# Patient Record
Sex: Male | Born: 1955
Health system: Southern US, Community
[De-identification: ages and names within clinical notes are randomized; demographics above are authoritative.]

## PROBLEM LIST (undated history)

## (undated) DIAGNOSIS — J301 Allergic rhinitis due to pollen: Secondary | ICD-10-CM

## (undated) DIAGNOSIS — E039 Hypothyroidism, unspecified: Secondary | ICD-10-CM

## (undated) DIAGNOSIS — M1990 Unspecified osteoarthritis, unspecified site: Secondary | ICD-10-CM

## (undated) DIAGNOSIS — I25110 Atherosclerotic heart disease of native coronary artery with unstable angina pectoris: Secondary | ICD-10-CM

## (undated) DIAGNOSIS — J45909 Unspecified asthma, uncomplicated: Secondary | ICD-10-CM

## (undated) DIAGNOSIS — I25118 Atherosclerotic heart disease of native coronary artery with other forms of angina pectoris: Secondary | ICD-10-CM

## (undated) DIAGNOSIS — J329 Chronic sinusitis, unspecified: Secondary | ICD-10-CM

## (undated) DIAGNOSIS — I2510 Atherosclerotic heart disease of native coronary artery without angina pectoris: Secondary | ICD-10-CM

## (undated) DIAGNOSIS — M109 Gout, unspecified: Secondary | ICD-10-CM

## (undated) DIAGNOSIS — E785 Hyperlipidemia, unspecified: Secondary | ICD-10-CM

## (undated) DIAGNOSIS — R931 Abnormal findings on diagnostic imaging of heart and coronary circulation: Secondary | ICD-10-CM

## (undated) DIAGNOSIS — J189 Pneumonia, unspecified organism: Secondary | ICD-10-CM

## (undated) DIAGNOSIS — K029 Dental caries, unspecified: Secondary | ICD-10-CM

## (undated) DIAGNOSIS — E119 Type 2 diabetes mellitus without complications: Secondary | ICD-10-CM

## (undated) DIAGNOSIS — H269 Unspecified cataract: Secondary | ICD-10-CM

## (undated) DIAGNOSIS — K219 Gastro-esophageal reflux disease without esophagitis: Secondary | ICD-10-CM

## (undated) DIAGNOSIS — E669 Obesity, unspecified: Secondary | ICD-10-CM

## (undated) DIAGNOSIS — T7840XA Allergy, unspecified, initial encounter: Secondary | ICD-10-CM

## (undated) DIAGNOSIS — I513 Intracardiac thrombosis, not elsewhere classified: Secondary | ICD-10-CM

## (undated) DIAGNOSIS — F329 Major depressive disorder, single episode, unspecified: Secondary | ICD-10-CM

## (undated) HISTORY — DX: Gout, unspecified: M10.9

## (undated) HISTORY — DX: Unspecified cataract: H26.9

## (undated) HISTORY — DX: Allergic rhinitis due to pollen: J30.1

## (undated) HISTORY — PX: COLONOSCOPY W/ POLYPECTOMY: SHX1380

## (undated) HISTORY — PX: CATARACT EXTRACTION W/ INTRAOCULAR LENS  IMPLANT, BILATERAL: SHX1307

## (undated) HISTORY — DX: Gastro-esophageal reflux disease without esophagitis: K21.9

## (undated) HISTORY — PX: DENTAL SURGERY: SHX609

## (undated) HISTORY — PX: NOSE SURGERY: SHX723

## (undated) HISTORY — DX: Hypothyroidism, unspecified: E03.9

## (undated) HISTORY — DX: Unspecified osteoarthritis, unspecified site: M19.90

## (undated) HISTORY — PX: KNEE SURGERY: SHX244

## (undated) HISTORY — DX: Dental caries, unspecified: K02.9

## (undated) HISTORY — PX: OTHER SURGICAL HISTORY: SHX169

## (undated) HISTORY — DX: Major depressive disorder, single episode, unspecified: F32.9

## (undated) HISTORY — DX: Hyperlipidemia, unspecified: E78.5

## (undated) HISTORY — DX: Allergy, unspecified, initial encounter: T78.40XA

## (undated) HISTORY — DX: Unspecified asthma, uncomplicated: J45.909

## (undated) HISTORY — DX: Abnormal findings on diagnostic imaging of heart and coronary circulation: R93.1

## (undated) HISTORY — DX: Chronic sinusitis, unspecified: J32.9

## (undated) HISTORY — DX: Obesity, unspecified: E66.9

## (undated) HISTORY — DX: Atherosclerotic heart disease of native coronary artery with other forms of angina pectoris: I25.118

## (undated) HISTORY — DX: Type 2 diabetes mellitus without complications: E11.9

## (undated) HISTORY — DX: Atherosclerotic heart disease of native coronary artery with unstable angina pectoris: I25.110

---

## 1997-11-29 DIAGNOSIS — R054 Cough syncope: Secondary | ICD-10-CM

## 1997-11-29 DIAGNOSIS — R55 Syncope and collapse: Secondary | ICD-10-CM | POA: Insufficient documentation

## 1997-11-29 HISTORY — DX: Cough syncope: R05.4

## 1997-11-29 HISTORY — DX: Syncope and collapse: R55

## 2007-04-17 HISTORY — PX: ESOPHAGOGASTRODUODENOSCOPY: SHX1529

## 2012-11-27 HISTORY — PX: COLONOSCOPY W/ POLYPECTOMY: SHX1380

## 2016-03-01 DIAGNOSIS — K219 Gastro-esophageal reflux disease without esophagitis: Secondary | ICD-10-CM | POA: Diagnosis not present

## 2016-03-01 DIAGNOSIS — J301 Allergic rhinitis due to pollen: Secondary | ICD-10-CM | POA: Diagnosis not present

## 2016-03-01 DIAGNOSIS — J45991 Cough variant asthma: Secondary | ICD-10-CM | POA: Diagnosis not present

## 2016-03-05 DIAGNOSIS — J45901 Unspecified asthma with (acute) exacerbation: Secondary | ICD-10-CM | POA: Diagnosis not present

## 2016-03-05 DIAGNOSIS — J209 Acute bronchitis, unspecified: Secondary | ICD-10-CM | POA: Diagnosis not present

## 2016-03-18 DIAGNOSIS — J301 Allergic rhinitis due to pollen: Secondary | ICD-10-CM | POA: Diagnosis not present

## 2016-03-24 DIAGNOSIS — J301 Allergic rhinitis due to pollen: Secondary | ICD-10-CM | POA: Diagnosis not present

## 2016-03-30 DIAGNOSIS — J301 Allergic rhinitis due to pollen: Secondary | ICD-10-CM | POA: Diagnosis not present

## 2016-04-06 DIAGNOSIS — J301 Allergic rhinitis due to pollen: Secondary | ICD-10-CM | POA: Diagnosis not present

## 2016-04-13 DIAGNOSIS — J301 Allergic rhinitis due to pollen: Secondary | ICD-10-CM | POA: Diagnosis not present

## 2016-04-23 DIAGNOSIS — J301 Allergic rhinitis due to pollen: Secondary | ICD-10-CM | POA: Diagnosis not present

## 2016-04-27 DIAGNOSIS — J301 Allergic rhinitis due to pollen: Secondary | ICD-10-CM | POA: Diagnosis not present

## 2016-05-06 DIAGNOSIS — J301 Allergic rhinitis due to pollen: Secondary | ICD-10-CM | POA: Diagnosis not present

## 2016-05-11 DIAGNOSIS — E1129 Type 2 diabetes mellitus with other diabetic kidney complication: Secondary | ICD-10-CM | POA: Diagnosis not present

## 2016-05-11 DIAGNOSIS — J301 Allergic rhinitis due to pollen: Secondary | ICD-10-CM | POA: Diagnosis not present

## 2016-05-11 DIAGNOSIS — R809 Proteinuria, unspecified: Secondary | ICD-10-CM | POA: Diagnosis not present

## 2016-05-18 DIAGNOSIS — J301 Allergic rhinitis due to pollen: Secondary | ICD-10-CM | POA: Diagnosis not present

## 2016-05-25 DIAGNOSIS — J301 Allergic rhinitis due to pollen: Secondary | ICD-10-CM | POA: Diagnosis not present

## 2016-05-26 DIAGNOSIS — M7061 Trochanteric bursitis, right hip: Secondary | ICD-10-CM | POA: Diagnosis not present

## 2016-05-26 DIAGNOSIS — M25551 Pain in right hip: Secondary | ICD-10-CM | POA: Diagnosis not present

## 2016-06-02 DIAGNOSIS — J301 Allergic rhinitis due to pollen: Secondary | ICD-10-CM | POA: Diagnosis not present

## 2016-06-08 DIAGNOSIS — J301 Allergic rhinitis due to pollen: Secondary | ICD-10-CM | POA: Diagnosis not present

## 2016-06-15 DIAGNOSIS — J301 Allergic rhinitis due to pollen: Secondary | ICD-10-CM | POA: Diagnosis not present

## 2016-06-21 DIAGNOSIS — J301 Allergic rhinitis due to pollen: Secondary | ICD-10-CM | POA: Diagnosis not present

## 2016-06-29 DIAGNOSIS — J301 Allergic rhinitis due to pollen: Secondary | ICD-10-CM | POA: Diagnosis not present

## 2016-07-06 DIAGNOSIS — J301 Allergic rhinitis due to pollen: Secondary | ICD-10-CM | POA: Diagnosis not present

## 2016-07-13 DIAGNOSIS — J301 Allergic rhinitis due to pollen: Secondary | ICD-10-CM | POA: Diagnosis not present

## 2016-07-22 DIAGNOSIS — J301 Allergic rhinitis due to pollen: Secondary | ICD-10-CM | POA: Diagnosis not present

## 2016-07-28 DIAGNOSIS — J301 Allergic rhinitis due to pollen: Secondary | ICD-10-CM | POA: Diagnosis not present

## 2016-08-10 DIAGNOSIS — J301 Allergic rhinitis due to pollen: Secondary | ICD-10-CM | POA: Diagnosis not present

## 2016-08-18 DIAGNOSIS — E1129 Type 2 diabetes mellitus with other diabetic kidney complication: Secondary | ICD-10-CM | POA: Diagnosis not present

## 2016-08-18 DIAGNOSIS — E119 Type 2 diabetes mellitus without complications: Secondary | ICD-10-CM | POA: Diagnosis not present

## 2016-08-18 DIAGNOSIS — Z87891 Personal history of nicotine dependence: Secondary | ICD-10-CM | POA: Diagnosis not present

## 2016-08-18 DIAGNOSIS — R070 Pain in throat: Secondary | ICD-10-CM | POA: Diagnosis not present

## 2016-08-18 DIAGNOSIS — R809 Proteinuria, unspecified: Secondary | ICD-10-CM | POA: Diagnosis not present

## 2016-08-18 DIAGNOSIS — R1310 Dysphagia, unspecified: Secondary | ICD-10-CM | POA: Diagnosis not present

## 2016-08-24 DIAGNOSIS — J301 Allergic rhinitis due to pollen: Secondary | ICD-10-CM | POA: Diagnosis not present

## 2016-09-07 DIAGNOSIS — J301 Allergic rhinitis due to pollen: Secondary | ICD-10-CM | POA: Diagnosis not present

## 2016-09-21 DIAGNOSIS — J301 Allergic rhinitis due to pollen: Secondary | ICD-10-CM | POA: Diagnosis not present

## 2016-10-05 DIAGNOSIS — J301 Allergic rhinitis due to pollen: Secondary | ICD-10-CM | POA: Diagnosis not present

## 2016-10-20 DIAGNOSIS — J301 Allergic rhinitis due to pollen: Secondary | ICD-10-CM | POA: Diagnosis not present

## 2016-11-09 DIAGNOSIS — J301 Allergic rhinitis due to pollen: Secondary | ICD-10-CM | POA: Diagnosis not present

## 2016-11-12 DIAGNOSIS — M109 Gout, unspecified: Secondary | ICD-10-CM | POA: Diagnosis not present

## 2016-11-24 DIAGNOSIS — J301 Allergic rhinitis due to pollen: Secondary | ICD-10-CM | POA: Diagnosis not present

## 2016-12-07 DIAGNOSIS — J301 Allergic rhinitis due to pollen: Secondary | ICD-10-CM | POA: Diagnosis not present

## 2016-12-22 DIAGNOSIS — J309 Allergic rhinitis, unspecified: Secondary | ICD-10-CM | POA: Diagnosis not present

## 2017-01-04 DIAGNOSIS — J301 Allergic rhinitis due to pollen: Secondary | ICD-10-CM | POA: Diagnosis not present

## 2017-01-13 DIAGNOSIS — M170 Bilateral primary osteoarthritis of knee: Secondary | ICD-10-CM | POA: Diagnosis not present

## 2017-01-18 DIAGNOSIS — J301 Allergic rhinitis due to pollen: Secondary | ICD-10-CM | POA: Diagnosis not present

## 2017-01-25 DIAGNOSIS — E039 Hypothyroidism, unspecified: Secondary | ICD-10-CM | POA: Diagnosis not present

## 2017-01-25 DIAGNOSIS — Z1389 Encounter for screening for other disorder: Secondary | ICD-10-CM | POA: Diagnosis not present

## 2017-01-25 DIAGNOSIS — E7800 Pure hypercholesterolemia, unspecified: Secondary | ICD-10-CM | POA: Diagnosis not present

## 2017-01-25 DIAGNOSIS — Z0000 Encounter for general adult medical examination without abnormal findings: Secondary | ICD-10-CM | POA: Diagnosis not present

## 2017-01-25 DIAGNOSIS — E119 Type 2 diabetes mellitus without complications: Secondary | ICD-10-CM | POA: Diagnosis not present

## 2017-03-09 DIAGNOSIS — J301 Allergic rhinitis due to pollen: Secondary | ICD-10-CM | POA: Diagnosis not present

## 2017-03-31 DIAGNOSIS — J301 Allergic rhinitis due to pollen: Secondary | ICD-10-CM | POA: Diagnosis not present

## 2017-04-27 DIAGNOSIS — E119 Type 2 diabetes mellitus without complications: Secondary | ICD-10-CM | POA: Diagnosis not present

## 2017-04-27 DIAGNOSIS — R002 Palpitations: Secondary | ICD-10-CM | POA: Diagnosis not present

## 2017-04-27 DIAGNOSIS — J301 Allergic rhinitis due to pollen: Secondary | ICD-10-CM | POA: Diagnosis not present

## 2017-04-27 DIAGNOSIS — E039 Hypothyroidism, unspecified: Secondary | ICD-10-CM | POA: Diagnosis not present

## 2017-05-04 DIAGNOSIS — J301 Allergic rhinitis due to pollen: Secondary | ICD-10-CM | POA: Diagnosis not present

## 2017-08-04 DIAGNOSIS — E7800 Pure hypercholesterolemia, unspecified: Secondary | ICD-10-CM | POA: Diagnosis not present

## 2017-08-04 DIAGNOSIS — E119 Type 2 diabetes mellitus without complications: Secondary | ICD-10-CM | POA: Diagnosis not present

## 2017-11-16 DIAGNOSIS — Z720 Tobacco use: Secondary | ICD-10-CM | POA: Diagnosis not present

## 2017-11-16 DIAGNOSIS — E7800 Pure hypercholesterolemia, unspecified: Secondary | ICD-10-CM | POA: Diagnosis not present

## 2017-11-16 DIAGNOSIS — F331 Major depressive disorder, recurrent, moderate: Secondary | ICD-10-CM | POA: Diagnosis not present

## 2017-11-16 DIAGNOSIS — E119 Type 2 diabetes mellitus without complications: Secondary | ICD-10-CM | POA: Diagnosis not present

## 2017-12-12 DIAGNOSIS — R1310 Dysphagia, unspecified: Secondary | ICD-10-CM | POA: Diagnosis not present

## 2017-12-12 DIAGNOSIS — R070 Pain in throat: Secondary | ICD-10-CM | POA: Diagnosis not present

## 2017-12-12 DIAGNOSIS — Z87891 Personal history of nicotine dependence: Secondary | ICD-10-CM | POA: Diagnosis not present

## 2017-12-12 DIAGNOSIS — Z720 Tobacco use: Secondary | ICD-10-CM | POA: Diagnosis not present

## 2017-12-28 DIAGNOSIS — R1310 Dysphagia, unspecified: Secondary | ICD-10-CM | POA: Diagnosis not present

## 2017-12-28 DIAGNOSIS — I700 Atherosclerosis of aorta: Secondary | ICD-10-CM | POA: Diagnosis not present

## 2018-01-02 DIAGNOSIS — M7042 Prepatellar bursitis, left knee: Secondary | ICD-10-CM | POA: Diagnosis not present

## 2018-01-06 DIAGNOSIS — R070 Pain in throat: Secondary | ICD-10-CM | POA: Diagnosis not present

## 2018-01-06 DIAGNOSIS — R1310 Dysphagia, unspecified: Secondary | ICD-10-CM | POA: Diagnosis not present

## 2018-01-06 DIAGNOSIS — Z87891 Personal history of nicotine dependence: Secondary | ICD-10-CM | POA: Diagnosis not present

## 2018-02-14 ENCOUNTER — Encounter: Payer: Self-pay | Admitting: Gastroenterology

## 2018-02-22 DIAGNOSIS — K219 Gastro-esophageal reflux disease without esophagitis: Secondary | ICD-10-CM | POA: Diagnosis not present

## 2018-02-22 DIAGNOSIS — Z1389 Encounter for screening for other disorder: Secondary | ICD-10-CM | POA: Diagnosis not present

## 2018-02-22 DIAGNOSIS — R809 Proteinuria, unspecified: Secondary | ICD-10-CM | POA: Diagnosis not present

## 2018-02-22 DIAGNOSIS — E039 Hypothyroidism, unspecified: Secondary | ICD-10-CM | POA: Diagnosis not present

## 2018-02-22 DIAGNOSIS — Z720 Tobacco use: Secondary | ICD-10-CM | POA: Diagnosis not present

## 2018-02-22 DIAGNOSIS — Z6836 Body mass index (BMI) 36.0-36.9, adult: Secondary | ICD-10-CM | POA: Diagnosis not present

## 2018-02-22 DIAGNOSIS — E7800 Pure hypercholesterolemia, unspecified: Secondary | ICD-10-CM | POA: Diagnosis not present

## 2018-02-22 DIAGNOSIS — Z0000 Encounter for general adult medical examination without abnormal findings: Secondary | ICD-10-CM | POA: Diagnosis not present

## 2018-02-22 DIAGNOSIS — Z87891 Personal history of nicotine dependence: Secondary | ICD-10-CM | POA: Diagnosis not present

## 2018-02-27 ENCOUNTER — Encounter: Payer: Self-pay | Admitting: Gastroenterology

## 2018-03-07 DIAGNOSIS — E782 Mixed hyperlipidemia: Secondary | ICD-10-CM | POA: Insufficient documentation

## 2018-03-07 DIAGNOSIS — E785 Hyperlipidemia, unspecified: Secondary | ICD-10-CM | POA: Insufficient documentation

## 2018-03-07 DIAGNOSIS — E1169 Type 2 diabetes mellitus with other specified complication: Secondary | ICD-10-CM | POA: Insufficient documentation

## 2018-03-07 DIAGNOSIS — E039 Hypothyroidism, unspecified: Secondary | ICD-10-CM | POA: Insufficient documentation

## 2018-03-07 DIAGNOSIS — F32A Depression, unspecified: Secondary | ICD-10-CM | POA: Insufficient documentation

## 2018-03-07 DIAGNOSIS — E119 Type 2 diabetes mellitus without complications: Secondary | ICD-10-CM | POA: Insufficient documentation

## 2018-03-07 DIAGNOSIS — M1A9XX0 Chronic gout, unspecified, without tophus (tophi): Secondary | ICD-10-CM

## 2018-03-07 DIAGNOSIS — M109 Gout, unspecified: Secondary | ICD-10-CM

## 2018-03-07 DIAGNOSIS — K219 Gastro-esophageal reflux disease without esophagitis: Secondary | ICD-10-CM | POA: Insufficient documentation

## 2018-03-07 DIAGNOSIS — F329 Major depressive disorder, single episode, unspecified: Secondary | ICD-10-CM | POA: Insufficient documentation

## 2018-03-07 HISTORY — DX: Gout, unspecified: M10.9

## 2018-03-07 HISTORY — DX: Depression, unspecified: F32.A

## 2018-03-07 HISTORY — DX: Gastro-esophageal reflux disease without esophagitis: K21.9

## 2018-03-07 HISTORY — DX: Type 2 diabetes mellitus without complications: E11.9

## 2018-03-07 HISTORY — DX: Hypothyroidism, unspecified: E03.9

## 2018-03-07 HISTORY — DX: Mixed hyperlipidemia: E78.2

## 2018-03-07 HISTORY — DX: Hyperlipidemia, unspecified: E78.5

## 2018-03-07 HISTORY — DX: Chronic gout, unspecified, without tophus (tophi): M1A.9XX0

## 2018-03-08 DIAGNOSIS — R002 Palpitations: Secondary | ICD-10-CM

## 2018-03-08 HISTORY — DX: Palpitations: R00.2

## 2018-03-08 NOTE — Progress Notes (Signed)
Cardiology Office Note:    Date:  03/09/2018   ID:  Derek Moore, DOB [birth year], MRN [id number]  PCP:  Crist Fat, MD  Cardiologist:  Norman Herrlich, MD   Referring MD: Crist Fat, MD  ASSESSMENT:    1. Palpitation   2. Abnormal EKG   3. Type 2 diabetes mellitus without complication, without long-term current use of insulin (HCC)   4. Pure hypercholesterolemia    PLAN:    In order of problems listed above:  1. With frequent symptoms he will undergo 48-hour Holter monitor I suspect he is having symptomatic PVCs and if that is case would benefit from beta-blocker 2. At high cardiovascular risk abnormal EKG he will undergo stress echo if high risk markers would benefit from revascularization 3. Stable continue current treatment 4. Stable continue statin 5. Hypertension stable continue current treatment  Next appointment   Medication Adjustments/Labs and Tests Ordered: Current medicines are reviewed at length with the patient today.  Concerns regarding medicines are outlined above.  Orders Placed This Encounter  Procedures  . HOLTER MONITOR - 48 HOUR  . EKG 12-Lead  . ECHOCARDIOGRAM STRESS TEST   No orders of the defined types were placed in this encounter.    Chief Complaint  Patient presents with  . New Patient (Initial Visit)    per Dr Leonia Reader to evaluate "heart fluttering"       History of Present Illness:    Derek Moore is a 62 y.o. male who is being seen today for the evaluation of palpitation at the request of Crist Fat, MD. About 20 years ago he saw Dr. Sherlyn Lick for evaluation of palpitation from his description had extra beats and he wore a monitor and had a stress test that was reassuring.  Since then he has had intermittent symptoms but recently he is under increased stress caring for multiple elderly family members and is much more aware of his heart skipping causing and being forceful occurring repetitively occurring just about every day  especially in the evening hours and disrupting his life.  He has had no syncope no exertional chest pain.  He takes no over-the-counter proarrhythmic medications.  He has mild exertional shortness of breath with vigorous heavy gardening work but not severe or limiting.  He is at high cardiovascular risk with type 2 diabetes hypertension hyperlipidemia and age   Past Medical History:  Diagnosis Date  . Acquired hypothyroidism 03/07/2018  . Depression 03/07/2018  . GERD (gastroesophageal reflux disease) 03/07/2018  . Gout 03/07/2018  . Hyperlipidemia 03/07/2018  . Type 2 diabetes mellitus (HCC) 03/07/2018    Past Surgical History:  Procedure Laterality Date  . kidney stone extraction    . KNEE SURGERY    . NOSE SURGERY      Current Medications: Current Meds  Medication Sig  . aspirin EC 81 MG tablet Take 81 mg by mouth daily.  Marland Kitchen atorvastatin (LIPITOR) 10 MG tablet Take 10 mg by mouth every other day.  . empagliflozin (JARDIANCE) 10 MG TABS tablet Take 10 mg by mouth daily.  Marland Kitchen levothyroxine (SYNTHROID, LEVOTHROID) 150 MCG tablet Take 150 mcg by mouth daily before breakfast.  . lisinopril (PRINIVIL,ZESTRIL) 2.5 MG tablet Take 2.5 mg by mouth daily.  . metFORMIN (GLUCOPHAGE) 1000 MG tablet Take 1,000 mg by mouth 2 (two) times daily with a meal.  . Omega-3 Fatty Acids (FISH OIL PO) Take 1 tablet by mouth 2 (two) times daily.  . pantoprazole (PROTONIX) 20  MG tablet Take 20 mg by mouth daily.  Marland Kitchen PARoxetine (PAXIL) 40 MG tablet Take 40 mg by mouth daily.     Allergies:   Codeine and Hydrocodone-acetaminophen   Social History   Socioeconomic History  . Marital status: Married    Spouse name: Not on file  . Number of children: Not on file  . Years of education: Not on file  . Highest education level: Not on file  Occupational History  . Not on file  Social Needs  . Financial resource strain: Not on file  . Food insecurity:    Worry: Not on file    Inability: Not on file  . Transportation  needs:    Medical: Not on file    Non-medical: Not on file  Tobacco Use  . Smoking status: Former Smoker    Types: Cigars, Pipe  . Smokeless tobacco: Current User    Types: Snuff  . Tobacco comment: stopped smoking pipe tobacco 30 years ago  Substance and Sexual Activity  . Alcohol use: Yes  . Drug use: Never  . Sexual activity: Not on file  Lifestyle  . Physical activity:    Days per week: Not on file    Minutes per session: Not on file  . Stress: Not on file  Relationships  . Social connections:    Talks on phone: Not on file    Gets together: Not on file    Attends religious service: Not on file    Active member of club or organization: Not on file    Attends meetings of clubs or organizations: Not on file    Relationship status: Not on file  Other Topics Concern  . Not on file  Social History Narrative  . Not on file     Family History: The patient's family history includes Blindness in his father; CAD in his father; Cancer in his father; Heart Problems in his mother; Hypertension in his father; Rheum arthritis in his mother.  ROS:   Review of Systems  Constitution: Negative.  HENT: Negative.   Eyes: Positive for visual disturbance.  Cardiovascular: Positive for palpitations.  Respiratory: Positive for shortness of breath.   Endocrine: Negative.   Hematologic/Lymphatic: Negative.   Skin: Negative.   Musculoskeletal: Negative.   Gastrointestinal: Negative.   Genitourinary: Negative.   Neurological: Negative.   Psychiatric/Behavioral: Negative.   Allergic/Immunologic: Negative.    Please see the history of present illness.     All other systems reviewed and are negative.  EKGs/Labs/Other Studies Reviewed:    The following studies were reviewed today:   EKG:  EKG is  ordered today.  The ekg ordered today demonstrates SRTh t inversion lateral, consider ASMI  Recent Labs:  CBC CMP TSH normal No results found for requested labs within last 8760 hours.    Recent Lipid Panel No results found for: CHOL, TRIG, HDL, CHOLHDL, VLDL, LDLCALC, LDLDIRECT  Physical Exam:    VS:  BP 100/66 (BP Location: Left Arm, Patient Position: Sitting, Cuff Size: Large)   Pulse 98   Ht 5\' 6"  (1.676 m)   Wt 224 lb 1.9 oz (101.7 kg)   SpO2 (!) 59%   BMI 36.17 kg/m     Wt Readings from Last 3 Encounters:  03/09/18 224 lb 1.9 oz (101.7 kg)     GEN:  Well nourished, well developed in no acute distress HEENT: Normal NECK: No JVD; No carotid bruits LYMPHATICS: No lymphadenopathy CARDIAC: RRR, no murmurs, rubs, gallops RESPIRATORY:  Clear  to auscultation without rales, wheezing or rhonchi  ABDOMEN: Soft, non-tender, non-distended MUSCULOSKELETAL:  No edema; No deformity  SKIN: Warm and dry NEUROLOGIC:  Alert and oriented x 3 PSYCHIATRIC:  Normal affect     Signed, Norman Herrlich, MD  03/09/2018 1:05 PM    [facility]

## 2018-03-09 ENCOUNTER — Encounter: Payer: Self-pay | Admitting: Cardiology

## 2018-03-09 ENCOUNTER — Ambulatory Visit (INDEPENDENT_AMBULATORY_CARE_PROVIDER_SITE_OTHER): Payer: BLUE CROSS/BLUE SHIELD | Admitting: Cardiology

## 2018-03-09 VITALS — BP 100/66 | HR 98 | Ht 66.0 in | Wt 224.1 lb

## 2018-03-09 DIAGNOSIS — R9431 Abnormal electrocardiogram [ECG] [EKG]: Secondary | ICD-10-CM | POA: Diagnosis not present

## 2018-03-09 DIAGNOSIS — E119 Type 2 diabetes mellitus without complications: Secondary | ICD-10-CM

## 2018-03-09 DIAGNOSIS — E7800 Pure hypercholesterolemia, unspecified: Secondary | ICD-10-CM | POA: Diagnosis not present

## 2018-03-09 DIAGNOSIS — R002 Palpitations: Secondary | ICD-10-CM

## 2018-03-09 HISTORY — DX: Abnormal electrocardiogram (ECG) (EKG): R94.31

## 2018-03-09 NOTE — Patient Instructions (Addendum)
Medication Instructions:  Your physician recommends that you continue on your current medications as directed. Please refer to the Current Medication list given to you today.  Labwork: None  Testing/Procedures: You had an EKG today.  Your physician has requested that you have a stress echocardiogram. For further information please visit [email or web address] Please follow instruction sheet as given.  Your physician has recommended that you wear a holter monitor. Holter monitors are medical devices that record the heart's electrical activity. Doctors most often use these monitors to diagnose arrhythmias. Arrhythmias are problems with the speed or rhythm of the heartbeat. The monitor is a small, portable device. You can wear one while you do your normal daily activities. This is usually used to diagnose what is causing palpitations/syncope (passing out). 48 hours.  Follow-Up: Your physician recommends that you schedule a follow-up appointment in: 4 weeks.  Any Other Special Instructions Will Be Listed Below (If Applicable).     If you need a refill on your cardiac medications before your next appointment, please call your pharmacy.    1. Avoid all over-the-counter antihistamines except Claritin/Loratadine and Zyrtec/Cetrizine. 2. Avoid all combination including cold sinus allergies flu decongestant and sleep medications 3. You can use Robitussin DM Mucinex and Mucinex DM for cough. 4. can use Tylenol aspirin ibuprofen and naproxen but no combinations such as sleep or sinus.

## 2018-04-03 ENCOUNTER — Telehealth: Payer: Self-pay | Admitting: *Deleted

## 2018-04-03 ENCOUNTER — Ambulatory Visit (HOSPITAL_BASED_OUTPATIENT_CLINIC_OR_DEPARTMENT_OTHER): Payer: BLUE CROSS/BLUE SHIELD

## 2018-04-03 ENCOUNTER — Encounter: Payer: Self-pay | Admitting: Gastroenterology

## 2018-04-03 NOTE — Telephone Encounter (Signed)
Pt has an echo and Holter monitor scheduled for 05-01-2018- he saw cardio 03-09-2018 with an abnormal EKG, and these tests ordered - I called pt and LM he will need to cancel his PV 5-8 and his colon 5-30 until AFTER all cardiac issues have been addressed - cardio note states may need revascularization- so he has to have cardiac clearance to have colon first.  [facility] so questions can be answered   Hilda Lias PV

## 2018-04-03 NOTE — Telephone Encounter (Signed)
Spoke with pt back in April and he was informed at this time he has to have cardiac clearance before he can do his colon here-  It appears the have been RS but he has not completed any cardio testing thus far   Hilda Lias pV

## 2018-04-04 NOTE — Telephone Encounter (Signed)
Pt has called and cancelled PV and colon- he needs to RS once he has complete cardiac clearance   Hilda Lias PV

## 2018-04-05 ENCOUNTER — Telehealth: Payer: Self-pay

## 2018-04-05 NOTE — Telephone Encounter (Signed)
Left message to return call. Advised patient that due to testing being rescheduled to 05/01/18 he should also reschedule his appointment with Dr. Dulce Sellar from 04/17/18 to around 05/10/18 so that results of testing have been reviewed. Advised patient if he had any questions he could ask to speak to the nurse, otherwise, front desk staff would be able to reschedule office visit with Dr. Dulce Sellar.

## 2018-04-17 ENCOUNTER — Ambulatory Visit: Payer: BLUE CROSS/BLUE SHIELD | Admitting: Cardiology

## 2018-04-18 DIAGNOSIS — R05 Cough: Secondary | ICD-10-CM | POA: Diagnosis not present

## 2018-04-19 DIAGNOSIS — M7042 Prepatellar bursitis, left knee: Secondary | ICD-10-CM | POA: Diagnosis not present

## 2018-04-27 ENCOUNTER — Encounter: Payer: BLUE CROSS/BLUE SHIELD | Admitting: Gastroenterology

## 2018-05-01 ENCOUNTER — Ambulatory Visit (HOSPITAL_BASED_OUTPATIENT_CLINIC_OR_DEPARTMENT_OTHER)
Admission: RE | Admit: 2018-05-01 | Discharge: 2018-05-01 | Disposition: A | Discharge status: Home / Self Care | Payer: BLUE CROSS/BLUE SHIELD | Source: Ambulatory Visit | Attending: Cardiology | Admitting: Cardiology

## 2018-05-01 ENCOUNTER — Ambulatory Visit: Payer: BLUE CROSS/BLUE SHIELD

## 2018-05-01 DIAGNOSIS — E785 Hyperlipidemia, unspecified: Secondary | ICD-10-CM | POA: Insufficient documentation

## 2018-05-01 DIAGNOSIS — E119 Type 2 diabetes mellitus without complications: Secondary | ICD-10-CM | POA: Insufficient documentation

## 2018-05-01 DIAGNOSIS — R002 Palpitations: Secondary | ICD-10-CM | POA: Diagnosis not present

## 2018-05-01 DIAGNOSIS — R9431 Abnormal electrocardiogram [ECG] [EKG]: Secondary | ICD-10-CM | POA: Diagnosis not present

## 2018-05-01 NOTE — Progress Notes (Signed)
Echocardiogram Echocardiogram Stress Test has been performed.  Derek Moore, Derek Moore 05/01/2018, 11:30 AM

## 2018-05-09 ENCOUNTER — Encounter: Payer: Self-pay | Admitting: Cardiology

## 2018-05-09 ENCOUNTER — Ambulatory Visit (INDEPENDENT_AMBULATORY_CARE_PROVIDER_SITE_OTHER): Payer: BLUE CROSS/BLUE SHIELD | Admitting: Cardiology

## 2018-05-09 VITALS — BP 106/68 | HR 67 | Ht 66.0 in | Wt 218.1 lb

## 2018-05-09 DIAGNOSIS — R002 Palpitations: Secondary | ICD-10-CM

## 2018-05-09 DIAGNOSIS — R9431 Abnormal electrocardiogram [ECG] [EKG]: Secondary | ICD-10-CM

## 2018-05-09 DIAGNOSIS — E7800 Pure hypercholesterolemia, unspecified: Secondary | ICD-10-CM | POA: Diagnosis not present

## 2018-05-09 DIAGNOSIS — R42 Dizziness and giddiness: Secondary | ICD-10-CM | POA: Insufficient documentation

## 2018-05-09 HISTORY — DX: Dizziness and giddiness: R42

## 2018-05-09 NOTE — Patient Instructions (Addendum)
Medication Instructions:  Your physician has recommended you make the following change in your medication:  STOP lisinopril  Labwork: None  Testing/Procedures: None  Follow-Up: Your physician recommends that you schedule a follow-up appointment in: as needed  Any Other Special Instructions Will Be Listed Below (If Applicable).  Please log your blood pressure and pulse twice daily for the next week. You may call this log into the office, fax, mail, deliver in person, or send via mychart.  Fax # [phone].    If you need a refill on your cardiac medications before your next appointment, please call your pharmacy.   [facility]  Garey Ham, RN, BSN  1. Avoid all over-the-counter antihistamines except Claritin/Loratadine and Zyrtec/Cetrizine. 2. Avoid all combination including cold sinus allergies flu decongestant and sleep medications 3. You can use Robitussin DM Mucinex and Mucinex DM for cough. 4. can use Tylenol aspirin ibuprofen and naproxen but no combinations such as sleep or sinus.  KardiaMobile [email or web address]        FDA-cleared, clinical grade mobile EKG monitor: Lourena Simmonds is the most clinically-validated mobile EKG used by the world's leading cardiac care medical professionals With Basic service, know instantly if your heart rhythm is normal or if atrial fibrillation is detected, and email the last single EKG recording to yourself or your doctor Premium service, available for purchase through the Kardia app for $9.99 per month or $99 per year, includes unlimited history and storage of your EKG recordings, a monthly EKG summary report to share with your doctor, along with the ability to track your blood pressure, activity and weight Includes one KardiaMobile phone clip FREE SHIPPING: Standard delivery 1-3 business days. Orders placed by 11:00am PST will ship that afternoon. Otherwise, will ship next business day. All orders ship via  [employer] from [place], [place]

## 2018-05-09 NOTE — Progress Notes (Addendum)
Cardiology Office Note:    Date:  05/09/2018   ID:  Derek, Moore 12-29-1955, MRN [id number]  PCP:  Crist Fat, MD  Cardiologist:  Norman Herrlich, MD    Referring MD: Leonia Reader, Barbara Cower, MD   Dr Chales Abrahams, as listed below his stress echo was normal, proceed with endoscopy.  ASSESSMENT:    1. Abnormal EKG   2. Palpitation   3. Pure hypercholesterolemia   4. Dizziness on standing   5. Dizzy    PLAN:    In order of problems listed above:  1. Stable both baseline and stress echo were normal no further evaluation 2. Improved resolved he did not wear Holter monitor 3. Stable continue his statin 4. He is symptomatic with orthostatic lightheadedness systolic blood pressure 106 he is taking Jardiance which can cause a diuresis and asked him to discontinue his ACE inhibitor and follow blood pressure at home.   Next appointment: As needed   Medication Adjustments/Labs and Tests Ordered: Current medicines are reviewed at length with the patient today.  Concerns regarding medicines are outlined above.  No orders of the defined types were placed in this encounter.  No orders of the defined types were placed in this encounter.   Chief Complaint  Patient presents with  . Follow-up  . Abnormal ECG  . Palpitations    History of Present Illness:    Derek Moore is a 62 y.o. male with a hx of palpitation and abnormal EKG last seen 03/09/18. Compliance with diet, lifestyle and medications: Yes  After his last visit palpitation resolved he did not wear an event monitor his stress echo was normal.  He notices lightheadedness on standing and blood pressure is borderline low with ACE inhibitor. Past Medical History:  Diagnosis Date  . Acquired hypothyroidism 03/07/2018  . Depression 03/07/2018  . GERD (gastroesophageal reflux disease) 03/07/2018  . Gout 03/07/2018  . Hyperlipidemia 03/07/2018  . Type 2 diabetes mellitus (HCC) 03/07/2018    Past Surgical History:  Procedure  Laterality Date  . kidney stone extraction    . KNEE SURGERY    . NOSE SURGERY      Current Medications: Current Meds  Medication Sig  . aspirin EC 81 MG tablet Take 81 mg by mouth daily.  Marland Kitchen atorvastatin (LIPITOR) 10 MG tablet Take 10 mg by mouth every other day.  . empagliflozin (JARDIANCE) 10 MG TABS tablet Take 10 mg by mouth daily.  Marland Kitchen GLIPIZIDE PO Take 1 tablet by mouth 2 (two) times daily.  Marland Kitchen levothyroxine (SYNTHROID, LEVOTHROID) 150 MCG tablet Take 150 mcg by mouth daily before breakfast.  . metFORMIN (GLUCOPHAGE) 1000 MG tablet Take 1,000 mg by mouth 2 (two) times daily with a meal.  . Omega-3 Fatty Acids (FISH OIL PO) Take 1 tablet by mouth 2 (two) times daily.  . pantoprazole (PROTONIX) 20 MG tablet Take 20 mg by mouth daily.  Marland Kitchen PARoxetine (PAXIL) 40 MG tablet Take 40 mg by mouth daily.  . [DISCONTINUED] lisinopril (PRINIVIL,ZESTRIL) 2.5 MG tablet Take 2.5 mg by mouth daily.     Allergies:   Codeine and Hydrocodone-acetaminophen   Social History   Socioeconomic History  . Marital status: Married    Spouse name: Not on file  . Number of children: Not on file  . Years of education: Not on file  . Highest education level: Not on file  Occupational History  . Not on file  Social Needs  . Financial resource strain: Not on file  .  Food insecurity:    Worry: Not on file    Inability: Not on file  . Transportation needs:    Medical: Not on file    Non-medical: Not on file  Tobacco Use  . Smoking status: Former Smoker    Types: Cigars, Pipe  . Smokeless tobacco: Current User    Types: Snuff  . Tobacco comment: stopped smoking pipe tobacco 30 years ago  Substance and Sexual Activity  . Alcohol use: Yes  . Drug use: Never  . Sexual activity: Not on file  Lifestyle  . Physical activity:    Days per week: Not on file    Minutes per session: Not on file  . Stress: Not on file  Relationships  . Social connections:    Talks on phone: Not on file    Gets together:  Not on file    Attends religious service: Not on file    Active member of club or organization: Not on file    Attends meetings of clubs or organizations: Not on file    Relationship status: Not on file  Other Topics Concern  . Not on file  Social History Narrative  . Not on file     Family History: The patient's family history includes Blindness in his father; CAD in his father; Cancer in his father; Heart Problems in his mother; Hypertension in his father; Rheum arthritis in his mother. ROS:   Please see the history of present illness.    All other systems reviewed and are negative.  EKGs/Labs/Other Studies Reviewed:    The following studies were reviewed today:  Stress echo: Impressions:  - 1. Stress echo negative for evidence of inducible ischemia.   2. Normal left ventricular systolic function at rest and good   augmentation of the left ventricular segments at stress   3. Normal exercise capacity. 10 mets Recent Labs: No results found for requested labs within last 8760 hours.  Recent Lipid Panel No results found for: CHOL, TRIG, HDL, CHOLHDL, VLDL, LDLCALC, LDLDIRECT  Physical Exam:    VS:  BP 106/68 (BP Location: Right Arm, Patient Position: Sitting, Cuff Size: Large)   Pulse 67   Ht 5\' 6"  (1.676 m)   Wt 218 lb 1.9 oz (98.9 kg)   SpO2 98%   BMI 35.21 kg/m     Wt Readings from Last 3 Encounters:  05/09/18 218 lb 1.9 oz (98.9 kg)  03/09/18 224 lb 1.9 oz (101.7 kg)     GEN:  Well nourished, well developed in no acute distress HEENT: Normal NECK: No JVD; No carotid bruits LYMPHATICS: No lymphadenopathy CARDIAC: RRR, no murmurs, rubs, gallops RESPIRATORY:  Clear to auscultation without rales, wheezing or rhonchi  ABDOMEN: Soft, non-tender, non-distended MUSCULOSKELETAL:  No edema; No deformity  SKIN: Warm and dry NEUROLOGIC:  Alert and oriented x 3 PSYCHIATRIC:  Normal affect    Signed, 05/09/18, MD  05/09/2018 11:54 AM    [facility]

## 2018-05-17 DIAGNOSIS — M7671 Peroneal tendinitis, right leg: Secondary | ICD-10-CM | POA: Diagnosis not present

## 2018-06-12 DIAGNOSIS — E1165 Type 2 diabetes mellitus with hyperglycemia: Secondary | ICD-10-CM | POA: Diagnosis not present

## 2018-06-20 ENCOUNTER — Telehealth: Payer: Self-pay | Admitting: Cardiology

## 2018-06-20 NOTE — Telephone Encounter (Signed)
Wants something put in Epic that its ok for him to have surgery

## 2018-06-22 NOTE — Telephone Encounter (Signed)
Patient needs to have colonoscopy by Dr Chales Abrahams.  He needs OK to have colonscopy after having stress echo in June.  Please advise.  Thank you

## 2018-06-26 DIAGNOSIS — H532 Diplopia: Secondary | ICD-10-CM | POA: Diagnosis not present

## 2018-06-28 DIAGNOSIS — M7661 Achilles tendinitis, right leg: Secondary | ICD-10-CM | POA: Diagnosis not present

## 2018-07-27 DIAGNOSIS — H532 Diplopia: Secondary | ICD-10-CM | POA: Diagnosis not present

## 2018-08-18 DIAGNOSIS — Z23 Encounter for immunization: Secondary | ICD-10-CM | POA: Diagnosis not present

## 2018-08-30 ENCOUNTER — Encounter: Payer: Self-pay | Admitting: Gastroenterology

## 2018-10-11 DIAGNOSIS — Z961 Presence of intraocular lens: Secondary | ICD-10-CM | POA: Diagnosis not present

## 2018-10-11 DIAGNOSIS — H532 Diplopia: Secondary | ICD-10-CM

## 2018-10-11 DIAGNOSIS — E119 Type 2 diabetes mellitus without complications: Secondary | ICD-10-CM | POA: Diagnosis not present

## 2018-10-11 DIAGNOSIS — Z8669 Personal history of other diseases of the nervous system and sense organs: Secondary | ICD-10-CM | POA: Diagnosis not present

## 2018-10-11 HISTORY — DX: Diplopia: H53.2

## 2018-10-12 ENCOUNTER — Ambulatory Visit (AMBULATORY_SURGERY_CENTER): Payer: Self-pay

## 2018-10-12 VITALS — Ht 68.0 in | Wt 226.6 lb

## 2018-10-12 DIAGNOSIS — Z86010 Personal history of colonic polyps: Secondary | ICD-10-CM

## 2018-10-12 DIAGNOSIS — M7042 Prepatellar bursitis, left knee: Secondary | ICD-10-CM | POA: Diagnosis not present

## 2018-10-12 MED ORDER — SOD PICOSULFATE-MAG OX-CIT ACD 10-3.5-12 MG-GM -GM/160ML PO SOLN: 1.0000 | Freq: Once | ORAL | 0 refills | Status: AC

## 2018-10-12 NOTE — Progress Notes (Signed)
Denies allergies to eggs or soy products. Denies complication of anesthesia or sedation. Denies use of weight loss medication. Denies use of O2.   Emmi instructions declined.   If you use chewing tobacco or snuff, please DO NOT use this the day of the procedure. If you do, your procedure will be cancelled !!

## 2018-10-18 ENCOUNTER — Ambulatory Visit (AMBULATORY_SURGERY_CENTER): Payer: BLUE CROSS/BLUE SHIELD | Admitting: Gastroenterology

## 2018-10-18 ENCOUNTER — Encounter: Payer: Self-pay | Admitting: Gastroenterology

## 2018-10-18 VITALS — BP 109/70 | HR 54 | Temp 97.1°F | Resp 16 | Ht 68.0 in | Wt 226.0 lb

## 2018-10-18 DIAGNOSIS — Z86010 Personal history of colonic polyps: Secondary | ICD-10-CM

## 2018-10-18 DIAGNOSIS — Z1211 Encounter for screening for malignant neoplasm of colon: Secondary | ICD-10-CM | POA: Diagnosis not present

## 2018-10-18 MED ORDER — SODIUM CHLORIDE 0.9 % IV SOLN
500.0000 mL | Freq: Once | INTRAVENOUS | Status: DC
Start: 2018-10-18 — End: 2018-10-18

## 2018-10-18 NOTE — Progress Notes (Signed)
A/ox3 pleased with MAC, report to RN 

## 2018-10-18 NOTE — Op Note (Addendum)
[facility] Patient Name: Derek Moore Procedure Date: 10/18/2018 8:04 AM MRN: [id number] Endoscopist: Lynann Bologna , MD Age: 62 Referring MD:  Date of Birth: 07/08/1956 Gender: Male Account #: [id number] Procedure:                Colonoscopy Indications:              High risk colon cancer surveillance: Personal                            history of colonic polyps Medicines:                Monitored Anesthesia Care Procedure:                Pre-Anesthesia Assessment:                           - Prior to the procedure, a History and Physical                            was performed, and patient medications and                            allergies were reviewed. The patient's tolerance of                            previous anesthesia was also reviewed. The risks                            and benefits of the procedure and the sedation                            options and risks were discussed with the patient.                            All questions were answered, and informed consent                            was obtained. Prior Anticoagulants: The patient has                            taken no previous anticoagulant or antiplatelet                            agents. ASA Grade Assessment: II - A patient with                            mild systemic disease. After reviewing the risks                            and benefits, the patient was deemed in                            satisfactory condition to undergo the procedure.  After obtaining informed consent, the colonoscope                            was passed under direct vision. Throughout the                            procedure, the patient's blood pressure, pulse, and                            oxygen saturations were monitored continuously. The                            Model CF-HQ190L [id number]) scope was introduced                            through the anus and advanced to the  the cecum,                            identified by appendiceal orifice and ileocecal                            valve. The colonoscopy was performed without                            difficulty. The patient tolerated the procedure                            well. The quality of the bowel preparation was good                            except in the right colon where there was some                            stool. Scope In: 8:08:29 AM Scope Out: 8:18:56 AM Scope Withdrawal Time: 0 hours 6 minutes 38 seconds  Total Procedure Duration: 0 hours 10 minutes 27 seconds  Findings:                 A few small-mouthed diverticula were found in the                            sigmoid colon and descending colon.                           Non-bleeding internal hemorrhoids were found. The                            hemorrhoids were small.                           The exam was otherwise without abnormality. Complications:            No immediate complications. Estimated Blood Loss:     Estimated blood loss: none. Impression:               - Mild left colonic  diverticulosis.                           - Non-bleeding internal hemorrhoids.                           - Otherwise normal colonoscopy. Recommendation:           - Patient has a contact number available for                            emergencies. The signs and symptoms of potential                            delayed complications were discussed with the                            patient. Return to normal activities tomorrow.                            Written discharge instructions were provided to the                            patient.                           - Resume previous diet.                           - Continue present medications.                           - Repeat colonoscopy in 10 years for screening                            purposes. Earlier, if with any new problems or if                            there is any change in  family history.                           - Return to GI clinic PRN. Lynann Bologna, MD 10/18/2018 8:23:49 AM This report has been signed electronically.

## 2018-10-18 NOTE — Patient Instructions (Signed)
Please read handouts provided. Continue present medications.      YOU HAD AN ENDOSCOPIC PROCEDURE TODAY AT THE [facility]:   Refer to the procedure report that was given to you for any specific questions about what was found during the examination.  If the procedure report does not answer your questions, please call your gastroenterologist to clarify.  If you requested that your care partner not be given the details of your procedure findings, then the procedure report has been included in a sealed envelope for you to review at your convenience later.  YOU SHOULD EXPECT: Some feelings of bloating in the abdomen. Passage of more gas than usual.  Walking can help get rid of the air that was put into your GI tract during the procedure and reduce the bloating. If you had a lower endoscopy (such as a colonoscopy or flexible sigmoidoscopy) you may notice spotting of blood in your stool or on the toilet paper. If you underwent a bowel prep for your procedure, you may not have a normal bowel movement for a few days.  Please Note:  You might notice some irritation and congestion in your nose or some drainage.  This is from the oxygen used during your procedure.  There is no need for concern and it should clear up in a day or so.  SYMPTOMS TO REPORT IMMEDIATELY:   Following lower endoscopy (colonoscopy or flexible sigmoidoscopy):  Excessive amounts of blood in the stool  Significant tenderness or worsening of abdominal pains  Swelling of the abdomen that is new, acute  Fever of 100F or higher    For urgent or emergent issues, a gastroenterologist can be reached at any hour by calling [phone].   DIET:  We do recommend a small meal at first, but then you may proceed to your regular diet.  Drink plenty of fluids but you should avoid alcoholic beverages for 24 hours.  ACTIVITY:  You should plan to take it easy for the rest of today and you should NOT DRIVE or use heavy machinery  until tomorrow (because of the sedation medicines used during the test).    FOLLOW UP: Our staff will call the number listed on your records the next business day following your procedure to check on you and address any questions or concerns that you may have regarding the information given to you following your procedure. If we do not reach you, we will leave a message.  However, if you are feeling well and you are not experiencing any problems, there is no need to return our call.  We will assume that you have returned to your regular daily activities without incident.  If any biopsies were taken you will be contacted by phone or by letter within the next 1-3 weeks.  Please call us at [phone] if you have not heard about the biopsies in 3 weeks.    SIGNATURES/CONFIDENTIALITY: You and/or your care partner have signed paperwork which will be entered into your electronic medical record.  These signatures attest to the fact that that the information above on your After Visit Summary has been reviewed and is understood.  Full responsibility of the confidentiality of this discharge information lies with you and/or your care-partner. 

## 2018-10-18 NOTE — Progress Notes (Signed)
Pt's states no medical or surgical changes since previsit or office visit. 

## 2018-10-19 ENCOUNTER — Telehealth: Payer: Self-pay

## 2018-10-19 NOTE — Telephone Encounter (Signed)
  Follow up Call-  Call back number 10/18/2018  Post procedure Call Back phone  # [phone]  Permission to leave phone message Yes  Some recent data might be hidden     Patient questions:  Do you have a fever, pain , or abdominal swelling? No. Pain Score  0 *  Have you tolerated food without any problems? Yes.    Have you been able to return to your normal activities? Yes.    Do you have any questions about your discharge instructions: Diet   No. Medications  No. Follow up visit  No.  Do you have questions or concerns about your Care? No.  Actions: * If pain score is 4 or above: No action needed, pain <4.  No problems noted per pt. maw

## 2018-10-24 DIAGNOSIS — M25562 Pain in left knee: Secondary | ICD-10-CM | POA: Diagnosis not present

## 2018-10-25 DIAGNOSIS — M7042 Prepatellar bursitis, left knee: Secondary | ICD-10-CM | POA: Diagnosis not present

## 2018-10-25 DIAGNOSIS — M76892 Other specified enthesopathies of left lower limb, excluding foot: Secondary | ICD-10-CM | POA: Diagnosis not present

## 2019-07-16 ENCOUNTER — Other Ambulatory Visit: Payer: Self-pay | Admitting: Neurology

## 2019-07-16 DIAGNOSIS — G7000 Myasthenia gravis without (acute) exacerbation: Secondary | ICD-10-CM

## 2019-07-16 DIAGNOSIS — H532 Diplopia: Secondary | ICD-10-CM

## 2019-07-24 ENCOUNTER — Other Ambulatory Visit: Payer: Self-pay

## 2019-07-24 ENCOUNTER — Ambulatory Visit
Admission: RE | Admit: 2019-07-24 | Discharge: 2019-07-24 | Disposition: A | Discharge status: Home / Self Care | Payer: 59 | Source: Ambulatory Visit | Attending: Neurology | Admitting: Neurology

## 2019-07-24 DIAGNOSIS — G7000 Myasthenia gravis without (acute) exacerbation: Secondary | ICD-10-CM

## 2019-07-24 DIAGNOSIS — H532 Diplopia: Secondary | ICD-10-CM

## 2020-02-07 DIAGNOSIS — M7732 Calcaneal spur, left foot: Secondary | ICD-10-CM

## 2020-02-07 DIAGNOSIS — M6702 Short Achilles tendon (acquired), left ankle: Secondary | ICD-10-CM | POA: Insufficient documentation

## 2020-02-07 DIAGNOSIS — M722 Plantar fascial fibromatosis: Secondary | ICD-10-CM | POA: Insufficient documentation

## 2020-02-07 HISTORY — DX: Plantar fascial fibromatosis: M72.2

## 2020-02-07 HISTORY — DX: Short Achilles tendon (acquired), left ankle: M67.02

## 2020-02-07 HISTORY — DX: Calcaneal spur, left foot: M77.32

## 2020-06-10 ENCOUNTER — Telehealth: Payer: Self-pay | Admitting: Cardiology

## 2020-06-10 ENCOUNTER — Encounter: Payer: Self-pay | Admitting: Internal Medicine

## 2020-06-10 NOTE — Telephone Encounter (Signed)
I think we should place in with Dr. Servando Salina

## 2020-06-10 NOTE — Telephone Encounter (Signed)
Spoke with patient just now and got him scheduled with Dr. Servando Salina tomorrow morning at 8 AM. He verbalizes understanding and thanks me for the call back.   Encouraged patient to call back with any questions or concerns.

## 2020-06-10 NOTE — Telephone Encounter (Signed)
New Message:     Pt said he went to an Urgent Care today in [place]. He had a hurting in his chest and abnormal EKG. He was told to see his Cardiologist. I do not see any openings until lAugust.

## 2020-06-11 ENCOUNTER — Ambulatory Visit (INDEPENDENT_AMBULATORY_CARE_PROVIDER_SITE_OTHER): Payer: Commercial Managed Care - PPO | Admitting: Cardiology

## 2020-06-11 ENCOUNTER — Other Ambulatory Visit: Payer: Self-pay

## 2020-06-11 ENCOUNTER — Encounter: Payer: Self-pay | Admitting: Cardiology

## 2020-06-11 VITALS — BP 98/68 | HR 56 | Ht 68.0 in | Wt 188.0 lb

## 2020-06-11 DIAGNOSIS — E7800 Pure hypercholesterolemia, unspecified: Secondary | ICD-10-CM

## 2020-06-11 MED ORDER — ROSUVASTATIN CALCIUM 10 MG PO TABS: 10.0000 mg | ORAL_TABLET | Freq: Every day | ORAL | 3 refills | Status: DC

## 2020-06-11 NOTE — Patient Instructions (Signed)
Medication Instructions:  Your physician has recommended you make the following change in your medication:   Start Crestor 10 mg daily.  *If you need a refill on your cardiac medications before your next appointment, please call your pharmacy*   Lab Work: None ordered If you have labs (blood work) drawn today and your tests are completely normal, you will receive your results only by: Marland Kitchen MyChart Message (if you have MyChart) OR . A paper copy in the mail If you have any lab test that is abnormal or we need to change your treatment, we will call you to review the results.   Testing/Procedures: None ordered   Follow-Up: At [facility], [place], you and your health needs are our priority.  As part of our continuing mission to provide you with exceptional heart care, we have created designated Provider Care Teams.  These Care Teams include your primary Cardiologist (physician) and Advanced Practice Providers (APPs -  Physician Assistants and Nurse Practitioners) who all work together to provide you with the care you need, when you need it.  We recommend signing up for the patient portal called "MyChart".  Sign up information is provided on this After Visit Summary.  MyChart is used to connect with patients for Virtual Visits (Telemedicine).  Patients are able to view lab/test results, encounter notes, upcoming appointments, etc.  Non-urgent messages can be sent to your provider as well.   To learn more about what you can do with MyChart, go to [email or web address].    Your next appointment:   3 month(s)  The format for your next appointment:   In Person  Provider:   Norman Herrlich, MD   Other Instructions NA
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
## 2020-07-17 DIAGNOSIS — M8949 Other hypertrophic osteoarthropathy, multiple sites: Secondary | ICD-10-CM

## 2020-07-17 DIAGNOSIS — Z720 Tobacco use: Secondary | ICD-10-CM

## 2020-07-17 DIAGNOSIS — F330 Major depressive disorder, recurrent, mild: Secondary | ICD-10-CM

## 2020-07-17 DIAGNOSIS — I2510 Atherosclerotic heart disease of native coronary artery without angina pectoris: Secondary | ICD-10-CM | POA: Insufficient documentation

## 2020-07-17 DIAGNOSIS — M159 Polyosteoarthritis, unspecified: Secondary | ICD-10-CM

## 2020-07-17 DIAGNOSIS — M150 Primary generalized (osteo)arthritis: Secondary | ICD-10-CM

## 2020-07-17 DIAGNOSIS — M4810 Ankylosing hyperostosis [Forestier], site unspecified: Secondary | ICD-10-CM

## 2020-07-17 DIAGNOSIS — I700 Atherosclerosis of aorta: Secondary | ICD-10-CM

## 2020-07-17 DIAGNOSIS — G7000 Myasthenia gravis without (acute) exacerbation: Secondary | ICD-10-CM

## 2020-07-17 DIAGNOSIS — Z87891 Personal history of nicotine dependence: Secondary | ICD-10-CM | POA: Insufficient documentation

## 2020-07-17 HISTORY — DX: Other hypertrophic osteoarthropathy, multiple sites: M89.49

## 2020-07-17 HISTORY — DX: Ankylosing hyperostosis (forestier), site unspecified: M48.10

## 2020-07-17 HISTORY — DX: Personal history of nicotine dependence: Z87.891

## 2020-07-17 HISTORY — DX: Tobacco use: Z72.0

## 2020-07-17 HISTORY — DX: Myasthenia gravis without (acute) exacerbation: G70.00

## 2020-07-17 HISTORY — DX: Atherosclerosis of aorta: I70.0

## 2020-07-17 HISTORY — DX: Major depressive disorder, recurrent, mild: F33.0

## 2020-07-17 HISTORY — DX: Atherosclerotic heart disease of native coronary artery without angina pectoris: I25.10

## 2020-07-17 HISTORY — DX: Primary generalized (osteo)arthritis: M15.0

## 2020-07-17 HISTORY — DX: Polyosteoarthritis, unspecified: M15.9

## 2020-09-10 ENCOUNTER — Other Ambulatory Visit: Payer: Self-pay

## 2020-09-10 DIAGNOSIS — M722 Plantar fascial fibromatosis: Secondary | ICD-10-CM

## 2020-09-10 DIAGNOSIS — M1990 Unspecified osteoarthritis, unspecified site: Secondary | ICD-10-CM | POA: Insufficient documentation

## 2020-09-10 DIAGNOSIS — F330 Major depressive disorder, recurrent, mild: Secondary | ICD-10-CM

## 2020-09-10 DIAGNOSIS — T7840XA Allergy, unspecified, initial encounter: Secondary | ICD-10-CM | POA: Insufficient documentation

## 2020-09-10 DIAGNOSIS — H269 Unspecified cataract: Secondary | ICD-10-CM | POA: Insufficient documentation

## 2020-09-10 DIAGNOSIS — J45909 Unspecified asthma, uncomplicated: Secondary | ICD-10-CM | POA: Insufficient documentation

## 2020-09-11 NOTE — Progress Notes (Signed)
Cardiology Office Note:    Date:  09/12/2020   ID:  Derek Moore, Derek Moore 1956/05/31, MRN [id number]  PCP:  Gordan Payment., MD  Cardiologist:  Norman Herrlich, MD      ASSESSMENT:    1. Chest pain of uncertain etiology   2. Pure hypercholesterolemia   3. Coronary artery calcification seen on CAT scan   4. Abnormal electrocardiogram   5. Need for immunization against influenza    PLAN:    In order of problems listed above:  1. He has had 2 cardiology interactions for chest pain abnormal EKG normal stress echo and has coronary artery calcification.  We discussed further evaluation and both feel he be well served by cardiac CTA to define the presence or absence of obstructive CAD and guide additional therapy beyond aspirin and statin.  Scheduled to follow-up in 6 weeks to review results 2. Stable he has had previous problems with statins and reduce his rosuvastatin every other day 3. Stable diabetes recent A1c 7.3% continue current treatment managed by his PCP   Next appointment: 6 weeks after cardiac CTA performed   Medication Adjustments/Labs and Tests Ordered: Current medicines are reviewed at length with the patient today.  Concerns regarding medicines are outlined above.  Orders Placed This Encounter  Procedures  . CT CORONARY MORPH W/CTA COR W/SCORE W/CA W/CM &/OR WO/CM  . CT CORONARY FRACTIONAL FLOW RESERVE DATA PREP  . CT CORONARY FRACTIONAL FLOW RESERVE FLUID ANALYSIS  . Flu Vaccine QUAD High Dose(Fluad)  . Basic metabolic panel   Meds ordered this encounter  Medications  . rosuvastatin (CRESTOR) 10 MG tablet    Sig: Take 1 tablet (10 mg total) by mouth 3 (three) times a week.    Dispense:  90 tablet    Refill:  3  . metoprolol tartrate (LOPRESSOR) 100 MG tablet    Sig: Take 1 tablet (100 mg total) by mouth once for 1 dose.    Dispense:  1 tablet    Refill:  0    No chief complaint on file.   History of Present Illness:    Derek Moore is a 64 y.o.  male with a hx of palpitation abnormal EKG and previous symptomatic hypotension from ACE inhibitor last seen 05/09/2018.  He was seen by my partner in the office for chest pain after trauma that was felt to be musculoskeletal.  He had a previous stress echocardiogram in 2019 which was normal.  He was having myalgias and was transition from Lipitor to rosuvastatin.  CT chest August 2020 showed coronary artery calcification Compliance with diet, lifestyle and medications: Yes  He is feeling better his chest pain had resolved there is been no recurrence and he tolerates rosuvastatin.  He had previous muscle symptoms were going to reduce to every other day.  No edema palpitations syncope or muscle weakness.  Recent labs through [facility] PCP 07/17/2020: TSH normal 2.20 CMP normal potassium 4.2 sodium 139 creatinine 1.05 GFR 74 cc normal liver function test Cholesterol 130 LDL 57 HDL 54 non-HDL cholesterol 76 Past Medical History:  Diagnosis Date  . Abnormal EKG 03/09/2018  . Acquired hypothyroidism 03/07/2018  . Allergy   . Aortic atherosclerosis (HCC) 07/17/2020   Formatting of this note might be different from the original. Seen on [facility] 06/2019  . Arthritis   . Asthma   . Cataract   . Chronic gout without tophus 03/07/2018  . Coronary artery calcification seen on CAT scan 07/17/2020  Formatting of this note might be different from the original. 06/2019. [facility]  . Depression 03/07/2018  . Diplopia 10/11/2018  . DISH (diffuse idiopathic skeletal hyperostosis) 07/17/2020   Formatting of this note might be different from the original. Seen on CT [facility] 06/2019  . Dizzy 05/09/2018  . Gastroesophageal reflux disease without esophagitis 03/07/2018   Formatting of this note might be different from the original. Long term  . GERD (gastroesophageal reflux disease) 03/07/2018  . Gout 03/07/2018  . Heel spur, left 02/07/2020  . Hyperlipidemia 03/07/2018  . Hypothyroidism (acquired) 03/07/2018   Formatting of this  note might be different from the original. 1990s.  . Mixed hyperlipidemia 03/07/2018  . Ocular myasthenia gravis (HCC) 07/17/2020   Formatting of this note might be different from the original. Ocular.  Follows at [facility].  Sees neurology and eye doctor.  . Palpitation 03/08/2018  . Plantar fasciitis 02/07/2020  . Primary osteoarthritis involving multiple joints 07/17/2020  . Smokeless tobacco use 07/17/2020   Formatting of this note might be different from the original. Discussed chantix.  He wants to use. Did well in past. Off label, he understands.  . Tightness of heel cord, left 02/07/2020  . Type 2 diabetes mellitus (HCC) 03/07/2018  . Type 2 diabetes mellitus without complication, without long-term current use of insulin (HCC) 03/07/2018   Formatting of this note might be different from the original. 2014.    Past Surgical History:  Procedure Laterality Date  . kidney stone extraction    . KNEE SURGERY    . NOSE SURGERY      Current Medications: Current Meds  Medication Sig  . aspirin EC 81 MG tablet Take 81 mg by mouth daily.  . empagliflozin (JARDIANCE) 10 MG TABS tablet Take 10 mg by mouth daily.  Marland Kitchen levothyroxine (SYNTHROID) 125 MCG tablet Take 125 mcg by mouth daily.  . metFORMIN (GLUCOPHAGE) 1000 MG tablet Take 1,000 mg by mouth 2 (two) times daily with a meal.  . Omega-3 Fatty Acids (FISH OIL PO) Take 1 tablet by mouth 2 (two) times daily.  . pantoprazole (PROTONIX) 40 MG tablet Take 40 mg by mouth daily.  Marland Kitchen PARoxetine (PAXIL) 40 MG tablet Take 40 mg by mouth daily.  Marland Kitchen pyridostigmine (MESTINON) 60 MG tablet Take 60 mg by mouth in the morning, at noon, and at bedtime.  . rosuvastatin (CRESTOR) 10 MG tablet Take 1 tablet (10 mg total) by mouth 3 (three) times a week.  . Semaglutide,0.25 or 0.5MG /DOS, (OZEMPIC, 0.25 OR 0.5 MG/DOSE,) 2 MG/1.5ML SOPN Inject 0.25 mg into the skin once a week.  . [DISCONTINUED] rosuvastatin (CRESTOR) 10 MG tablet Take 1 tablet (10 mg total) by mouth daily.       Allergies:   Codeine, Hydrocodone-acetaminophen, Other, and Latex   Social History   Socioeconomic History  . Marital status: Married    Spouse name: Not on file  . Number of children: Not on file  . Years of education: Not on file  . Highest education level: Not on file  Occupational History  . Not on file  Tobacco Use  . Smoking status: Former Smoker    Types: Cigars, Pipe  . Smokeless tobacco: Current User    Types: Snuff  . Tobacco comment: stopped smoking pipe tobacco 30 years ago  Vaping Use  . Vaping Use: Never used  Substance and Sexual Activity  . Alcohol use: Yes    Comment: Occasional Beer   . Drug use: Never  . Sexual activity:  Not on file  Other Topics Concern  . Not on file  Social History Narrative  . Not on file   Social Determinants of Health   Financial Resource Strain:   . Difficulty of Paying Living Expenses: Not on file  Food Insecurity:   . Worried About Programme researcher, broadcasting/film/video in the Last Year: Not on file  . Ran Out of Food in the Last Year: Not on file  Transportation Needs:   . Lack of Transportation (Medical): Not on file  . Lack of Transportation (Non-Medical): Not on file  Physical Activity:   . Days of Exercise per Week: Not on file  . Minutes of Exercise per Session: Not on file  Stress:   . Feeling of Stress : Not on file  Social Connections:   . Frequency of Communication with Friends and Family: Not on file  . Frequency of Social Gatherings with Friends and Family: Not on file  . Attends Religious Services: Not on file  . Active Member of Clubs or Organizations: Not on file  . Attends Banker Meetings: Not on file  . Marital Status: Not on file     Family History: The patient's family history includes Blindness in his father; CAD in his father; Cancer in his father; Heart Problems in his mother; Hypertension in his father; Rheum arthritis in his mother. There is no history of Colon cancer, Esophageal cancer,  Rectal cancer, or Stomach cancer. ROS:   Please see the history of present illness.    All other systems reviewed and are negative.  EKGs/Labs/Other Studies Reviewed:    The following studies were reviewed today:   Physical Exam:    VS:  BP 118/60   Pulse (!) 58   Ht 5\' 6"  (1.676 m)   Wt 188 lb (85.3 kg)   SpO2 96%   BMI 30.34 kg/m     Wt Readings from Last 3 Encounters:  09/12/20 188 lb (85.3 kg)  06/11/20 188 lb (85.3 kg)  10/18/18 226 lb (102.5 kg)     GEN:  Well nourished, well developed in no acute distress HEENT: Normal NECK: No JVD; No carotid bruits LYMPHATICS: No lymphadenopathy CARDIAC: No chest wall tenderness RRR, no murmurs, rubs, gallops RESPIRATORY:  Clear to auscultation without rales, wheezing or rhonchi  ABDOMEN: Soft, non-tender, non-distended MUSCULOSKELETAL:  No edema; No deformity  SKIN: Warm and dry NEUROLOGIC:  Alert and oriented x 3 PSYCHIATRIC:  Normal affect    Signed, 10/20/18, MD  09/12/2020 8:30 AM    [facility]

## 2020-09-12 ENCOUNTER — Other Ambulatory Visit: Payer: Self-pay

## 2020-09-12 ENCOUNTER — Encounter: Payer: Self-pay | Admitting: Cardiology

## 2020-09-12 ENCOUNTER — Ambulatory Visit: Payer: Commercial Managed Care - PPO | Admitting: Cardiology

## 2020-09-12 VITALS — BP 118/60 | HR 58 | Ht 66.0 in | Wt 188.0 lb

## 2020-09-12 DIAGNOSIS — R9431 Abnormal electrocardiogram [ECG] [EKG]: Secondary | ICD-10-CM | POA: Diagnosis not present

## 2020-09-12 DIAGNOSIS — I2510 Atherosclerotic heart disease of native coronary artery without angina pectoris: Secondary | ICD-10-CM

## 2020-09-12 DIAGNOSIS — E7800 Pure hypercholesterolemia, unspecified: Secondary | ICD-10-CM

## 2020-09-12 DIAGNOSIS — Z23 Encounter for immunization: Secondary | ICD-10-CM

## 2020-09-12 DIAGNOSIS — R079 Chest pain, unspecified: Secondary | ICD-10-CM

## 2020-09-12 MED ORDER — ROSUVASTATIN CALCIUM 10 MG PO TABS: 10.0000 mg | ORAL_TABLET | ORAL | 3 refills | Status: DC

## 2020-09-12 MED ORDER — METOPROLOL TARTRATE 100 MG PO TABS: 100.0000 mg | ORAL_TABLET | Freq: Once | ORAL | 0 refills | Status: DC

## 2020-09-12 NOTE — Patient Instructions (Addendum)
Medication Instructions:  Your physician has recommended you make the following change in your medication:  DECREASE: Rosuvastatin 10 mg take one tablet by mouth three times weekly.  *If you need a refill on your cardiac medications before your next appointment, please call your pharmacy*   Lab Work: None If you have labs (blood work) drawn today and your tests are completely normal, you will receive your results only by: Marland Kitchen MyChart Message (if you have MyChart) OR . A paper copy in the mail If you have any lab test that is abnormal or we need to change your treatment, we will call you to review the results.   Testing/Procedures: Your cardiac CT will be scheduled at the below location:   [facility] [address] [place], [place] [address] [phone]  If scheduled at [facility], please arrive at the [facility] main entrance of [facility] 30 minutes prior to test start time. Proceed to the [facility] Radiology Department (first floor) to check-in and test prep.   Please follow these instructions carefully (unless otherwise directed):  Hold all erectile dysfunction medications at least 3 days (72 hrs) prior to test.  On the Night Before the Test: . Be sure to Drink plenty of water. . Do not consume any caffeinated/decaffeinated beverages or chocolate 12 hours prior to your test. . Do not take any antihistamines 12 hours prior to your test.  On the Day of the Test: . Drink plenty of water. Do not drink any water within one hour of the test. . Do not eat any food 4 hours prior to the test. . You may take your regular medications prior to the test.  . Take metoprolol (Lopressor) two hours prior to test.       After the Test: . Drink plenty of water. . After receiving IV contrast, you may experience a mild flushed feeling. This is normal. . On occasion, you may experience a mild rash up to 24 hours after the test. This is not dangerous. If  this occurs, you can take Benadryl 25 mg and increase your fluid intake. . If you experience trouble breathing, this can be serious. If it is severe call 911 IMMEDIATELY. If it is mild, please call our office. . If you take any of these medications: Glipizide/Metformin, Avandament, Glucavance, please do not take 48 hours after completing test unless otherwise instructed.   Once we have confirmed authorization from your insurance company, we will call you to set up a date and time for your test. Based on how quickly your insurance processes prior authorizations requests, please allow up to 4 weeks to be contacted for scheduling your Cardiac CT appointment. Be advised that routine Cardiac CT appointments could be scheduled as many as 8 weeks after your provider has ordered it.  For non-scheduling related questions, please contact the cardiac imaging nurse navigator should you have any questions/concerns: Marchia Bond, Cardiac Imaging Nurse Navigator Burley Saver, Interim Cardiac Imaging Nurse [place] [facility] Direct Office Dial: [phone]   For scheduling needs, including cancellations and rescheduling, please call Vivien Rota at [phone], option [phone].      Follow-Up: At [facility], you and your health needs are our priority.  As part of our continuing mission to provide you with exceptional heart care, we have created designated Provider Care Teams.  These Care Teams include your primary Cardiologist (physician) and Advanced Practice Providers (APPs -  Physician Assistants and Nurse Practitioners) who  all work together to provide you with the care you need, when you need it.  We recommend signing up for the patient portal called "MyChart".  Sign up information is provided on this After Visit Summary.  MyChart is used to connect with patients for Virtual Visits (Telemedicine).  Patients are able to view lab/test results, encounter notes, upcoming appointments, etc.   Non-urgent messages can be sent to your provider as well.   To learn more about what you can do with MyChart, go to [email or web address].    Your next appointment:   6 week(s)  The format for your next appointment:   In Person  Provider:   Shirlee More, MD   Other Instructions

## 2020-09-22 ENCOUNTER — Other Ambulatory Visit: Payer: Self-pay

## 2020-09-22 DIAGNOSIS — R9431 Abnormal electrocardiogram [ECG] [EKG]: Secondary | ICD-10-CM

## 2020-09-22 DIAGNOSIS — E7800 Pure hypercholesterolemia, unspecified: Secondary | ICD-10-CM

## 2020-09-22 DIAGNOSIS — R079 Chest pain, unspecified: Secondary | ICD-10-CM

## 2020-09-22 DIAGNOSIS — I2510 Atherosclerotic heart disease of native coronary artery without angina pectoris: Secondary | ICD-10-CM

## 2020-09-22 LAB — BASIC METABOLIC PANEL
BUN/Creatinine Ratio: 14 (ref 10–24)
BUN: 13 mg/dL (ref 8–27)
CO2: 22 mmol/L (ref 20–29)
Calcium: 9.8 mg/dL (ref 8.6–10.2)
Chloride: 103 mmol/L (ref 96–106)
Creatinine, Ser: 0.94 mg/dL (ref 0.76–1.27)
GFR calc Af Amer: 98 mL/min/{1.73_m2} (ref >59)
GFR calc non Af Amer: 85 mL/min/{1.73_m2} (ref >59)
Glucose: 193 mg/dL — ABNORMAL HIGH (ref 65–99)
Potassium: 4.6 mmol/L (ref 3.5–5.2)
Sodium: 139 mmol/L (ref 134–144)

## 2020-09-23 ENCOUNTER — Telehealth: Payer: Self-pay

## 2020-09-23 NOTE — Telephone Encounter (Signed)
-----   Message from Thomasene Ripple, DO sent at 09/22/2020  4:38 PM EDT ----- Lab normal

## 2020-09-23 NOTE — Telephone Encounter (Signed)
Spoke with patient regarding results and recommendation.  Patient verbalizes understanding and is agreeable to plan of care. Advised patient to call back with any issues or concerns.  

## 2020-09-25 DIAGNOSIS — E538 Deficiency of other specified B group vitamins: Secondary | ICD-10-CM | POA: Insufficient documentation

## 2020-09-25 HISTORY — DX: Deficiency of other specified B group vitamins: E53.8

## 2020-09-26 ENCOUNTER — Telehealth (HOSPITAL_COMMUNITY): Payer: Self-pay | Admitting: Emergency Medicine

## 2020-09-26 NOTE — Telephone Encounter (Signed)
Reaching out to patient to offer assistance regarding upcoming cardiac imaging study; pt verbalizes understanding of appt date/time, parking situation and where to check in, pre-test NPO status and medications ordered, and verified current allergies; name and call back number provided for further questions should they arise Rockwell Alexandria RN Navigator Cardiac Imaging [facility] [phone] office [phone] cell  ECG in 2019 HR 59 ECG in 2021 HR 59  I instructed patient to take his 100mg  metoprolol tartrate tablet and break in half for 50mg  dose. Pt verbalized understanding.  Pt also verbalized understanding to refrain from ED medication use until test complete. 

## 2020-09-29 ENCOUNTER — Ambulatory Visit (HOSPITAL_COMMUNITY)
Admission: RE | Admit: 2020-09-29 | Discharge: 2020-09-29 | Disposition: A | Discharge status: Home / Self Care | Payer: Commercial Managed Care - PPO | Source: Ambulatory Visit | Attending: Cardiology | Admitting: Cardiology

## 2020-09-29 ENCOUNTER — Encounter: Payer: Commercial Managed Care - PPO | Admitting: *Deleted

## 2020-09-29 ENCOUNTER — Other Ambulatory Visit: Payer: Self-pay

## 2020-09-29 DIAGNOSIS — I2510 Atherosclerotic heart disease of native coronary artery without angina pectoris: Secondary | ICD-10-CM | POA: Diagnosis not present

## 2020-09-29 DIAGNOSIS — R9431 Abnormal electrocardiogram [ECG] [EKG]: Secondary | ICD-10-CM | POA: Insufficient documentation

## 2020-09-29 DIAGNOSIS — Z006 Encounter for examination for normal comparison and control in clinical research program: Secondary | ICD-10-CM

## 2020-09-29 MED ORDER — NITROGLYCERIN 0.4 MG SL SUBL
SUBLINGUAL_TABLET | SUBLINGUAL | Status: AC
  Filled 2020-09-29: qty 1

## 2020-09-29 MED ORDER — IOHEXOL 350 MG/ML SOLN
80.0000 mL | Freq: Once | INTRAVENOUS | Status: AC | PRN
  Administered 2020-09-29: 80 mL via INTRAVENOUS

## 2020-09-29 MED ORDER — NITROGLYCERIN 0.4 MG SL SUBL
0.4000 mg | SUBLINGUAL_TABLET | Freq: Once | SUBLINGUAL | Status: AC
  Administered 2020-09-29: 0.4 mg via SUBLINGUAL

## 2020-09-29 NOTE — Research (Signed)
Subject Name: Derek Moore  Subject met inclusion and exclusion criteria.  The informed consent form, study requirements and expectations were reviewed with the subject and questions and concerns were addressed prior to the signing of the consent form.  The subject verbalized understanding of the trial requirements.  The subject agreed to participate in the CADFEM trial and signed the informed consent at [place] on 09/29/20  The informed consent was obtained prior to performance of any protocol-specific procedures for the subject.  A copy of the signed informed consent was given to the subject and a copy was placed in the subject's medical record.   Timoteo Gaul

## 2020-09-30 ENCOUNTER — Telehealth: Payer: Self-pay

## 2020-09-30 ENCOUNTER — Ambulatory Visit (HOSPITAL_COMMUNITY)
Admission: RE | Admit: 2020-09-30 | Discharge: 2020-09-30 | Disposition: A | Discharge status: Home / Self Care | Payer: Commercial Managed Care - PPO | Source: Ambulatory Visit | Attending: Cardiology | Admitting: Cardiology

## 2020-09-30 DIAGNOSIS — R9431 Abnormal electrocardiogram [ECG] [EKG]: Secondary | ICD-10-CM | POA: Diagnosis present

## 2020-09-30 DIAGNOSIS — I2510 Atherosclerotic heart disease of native coronary artery without angina pectoris: Secondary | ICD-10-CM | POA: Diagnosis not present

## 2020-09-30 NOTE — Telephone Encounter (Signed)
-----   Message from Derek Daub, MD sent at 09/30/2020 12:08 PM EDT ----- His cardiac CTA is abnormal  Calcium score is elevated he needs to remain on a statin  He has blockages in all coronary arteries, please work him into my schedule not tomorrow as I have a dinner engagement speaking to the dental Society or he can wait until his scheduled office follow-up if he prefers this is not urgent

## 2020-09-30 NOTE — Telephone Encounter (Signed)
Spoke with patient regarding results and recommendation.  Patient verbalizes understanding and is agreeable to plan of care. Advised patient to call back with any issues or concerns.   Patient is scheduled for 10/02/2020 at 8 AM

## 2020-10-02 ENCOUNTER — Ambulatory Visit: Payer: Commercial Managed Care - PPO | Admitting: Cardiology

## 2020-10-02 ENCOUNTER — Encounter: Payer: Self-pay | Admitting: Cardiology

## 2020-10-02 ENCOUNTER — Other Ambulatory Visit: Payer: Self-pay

## 2020-10-02 VITALS — BP 116/64 | HR 57 | Ht 66.0 in | Wt 191.0 lb

## 2020-10-02 DIAGNOSIS — R768 Other specified abnormal immunological findings in serum: Secondary | ICD-10-CM | POA: Insufficient documentation

## 2020-10-02 DIAGNOSIS — I25118 Atherosclerotic heart disease of native coronary artery with other forms of angina pectoris: Secondary | ICD-10-CM | POA: Diagnosis not present

## 2020-10-02 DIAGNOSIS — Z794 Long term (current) use of insulin: Secondary | ICD-10-CM | POA: Diagnosis not present

## 2020-10-02 DIAGNOSIS — E7800 Pure hypercholesterolemia, unspecified: Secondary | ICD-10-CM | POA: Diagnosis not present

## 2020-10-02 DIAGNOSIS — E119 Type 2 diabetes mellitus without complications: Secondary | ICD-10-CM

## 2020-10-02 DIAGNOSIS — R7689 Other specified abnormal immunological findings in serum: Secondary | ICD-10-CM | POA: Insufficient documentation

## 2020-10-02 HISTORY — DX: Other specified abnormal immunological findings in serum: R76.8

## 2020-10-02 MED ORDER — METOPROLOL SUCCINATE ER 25 MG PO TB24: 25.0000 mg | ORAL_TABLET | Freq: Every day | ORAL | 3 refills | Status: DC

## 2020-10-02 NOTE — Progress Notes (Signed)
Cardiology Office Note:    Date:  10/02/2020   ID:  Derek Moore, DOB 04/05/1956, MRN [id number]  PCP:  Grisso, Greg A., MD  Cardiologist:  Khy Pitre, MD    Referring MD: Grisso, Greg A., MD    ASSESSMENT:    1. Coronary artery disease of native artery of native heart with stable angina pectoris (HCC)   2. Pure hypercholesterolemia   3. Type 2 diabetes mellitus without complication, with long-term current use of insulin (HCC)    PLAN:    In order of problems listed above:  1. His cardiac CTA reveals not only a very high calcium score with severe three-vessel coronary artery disease high risk anatomy.  Options benefits risks detailed undergo coronary angiography to explore options for revascularization.  He will continue aspirin statin diabetic medications and begin daily beta-blocker. 2. Continue statin 3. Continue current treatment   Next appointment: 6 weeks   Medication Adjustments/Labs and Tests Ordered: Current medicines are reviewed at length with the patient today.  Concerns regarding medicines are outlined above.  No orders of the defined types were placed in this encounter.  Meds ordered this encounter  Medications  . metoprolol succinate (TOPROL XL) 25 MG 24 hr tablet    Sig: Take 1 tablet (25 mg total) by mouth daily.    Dispense:  90 tablet    Refill:  3    No chief complaint on file.   History of Present Illness:    Derek Moore is a 63 y.o. male with a hx of chest pain hyperlipidemia and coronary artery calcification last seen 09/12/2020. Compliance with diet, lifestyle and medications: Yes  He has good healthcare literacy I reviewed the results of his cardiac CTA his calcium score is very high and he has severe three-vessel coronary disease FFR is reduced in the LAD left circumflex and the right coronary artery is occluded distally.  He has had no further chest pain however with his high risk cardiac CTA I advised him undergo coronary  angiography to look at his opportunities for treatment and potential revascularization.  He will continue aspirin as well as his high intensity statin initiate a beta-blocker.  Risk benefits and options detailed informed consent obtained  He was referred for cardiac CTA which showed a very high calcium score 739 88th percentile for age and sex he had moderate to severe multivessel CAD with 1 to 24% left main coronary artery 50 to 69% left anterior descending coronary artery stenosis proximal and mid vessel 25 to 49% left circumflex coronary artery and 50 to 69% right coronary artery stenosis.  Subsequent fractional flow reserve showed diminished flow with hemodynamic stenosis in the left anterior descending coronary artery and left circumflex coronary artery. Past Medical History:  Diagnosis Date  . Abnormal EKG 03/09/2018  . Acquired hypothyroidism 03/07/2018  . Allergy   . Aortic atherosclerosis (HCC) 07/17/2020   Formatting of this note might be different from the original. Seen on CT [facility] 06/2019  . Arthritis   . Asthma   . Cataract   . Chronic gout without tophus 03/07/2018  . Coronary artery calcification seen on CAT scan 07/17/2020   Formatting of this note might be different from the original. 06/2019. [facility]  . Depression 03/07/2018  . Diplopia 10/11/2018  . DISH (diffuse idiopathic skeletal hyperostosis) 07/17/2020   Formatting of this note might be different from the original. Seen on CT [facility] 06/2019  . Dizzy 05/09/2018  . Gastroesophageal reflux disease without esophagitis   03/07/2018   Formatting of this note might be different from the original. Long term  . GERD (gastroesophageal reflux disease) 03/07/2018  . Gout 03/07/2018  . Heel spur, left 02/07/2020  . Hyperlipidemia 03/07/2018  . Hypothyroidism (acquired) 03/07/2018   Formatting of this note might be different from the original. 1990s.  . Mixed hyperlipidemia 03/07/2018  . Ocular myasthenia gravis (HCC) 07/17/2020   Formatting of this note might be  different from the original. Ocular.  Follows at [facility].  Sees neurology and eye doctor.  . Palpitation 03/08/2018  . Plantar fasciitis 02/07/2020  . Primary osteoarthritis involving multiple joints 07/17/2020  . Smokeless tobacco use 07/17/2020   Formatting of this note might be different from the original. Discussed chantix.  He wants to use. Did well in past. Off label, he understands.  . Tightness of heel cord, left 02/07/2020  . Type 2 diabetes mellitus (HCC) 03/07/2018  . Type 2 diabetes mellitus without complication, without long-term current use of insulin (HCC) 03/07/2018   Formatting of this note might be different from the original. 2014.    Past Surgical History:  Procedure Laterality Date  . kidney stone extraction    . KNEE SURGERY    . NOSE SURGERY      Current Medications: Current Meds  Medication Sig  . aspirin EC 81 MG tablet Take 81 mg by mouth daily.  . empagliflozin (JARDIANCE) 10 MG TABS tablet Take 10 mg by mouth daily.  Marland Kitchen levothyroxine (SYNTHROID) 125 MCG tablet Take 125 mcg by mouth daily.  . metFORMIN (GLUCOPHAGE) 1000 MG tablet Take 1,000 mg by mouth 2 (two) times daily with a meal.  . Omega-3 Fatty Acids (FISH OIL PO) Take 1 tablet by mouth 2 (two) times daily.  . pantoprazole (PROTONIX) 40 MG tablet Take 40 mg by mouth daily.  Marland Kitchen PARoxetine (PAXIL) 40 MG tablet Take 40 mg by mouth daily.  Marland Kitchen pyridostigmine (MESTINON) 60 MG tablet Take 60 mg by mouth in the morning, at noon, and at bedtime.  . rosuvastatin (CRESTOR) 10 MG tablet Take 1 tablet (10 mg total) by mouth 3 (three) times a week.  . Semaglutide,0.25 or 0.5MG /DOS, (OZEMPIC, 0.25 OR 0.5 MG/DOSE,) 2 MG/1.5ML SOPN Inject 0.25 mg into the skin once a week.     Allergies:   Codeine, Hydrocodone-acetaminophen, Other, and Latex   Social History   Socioeconomic History  . Marital status: Married    Spouse name: Not on file  . Number of children: Not on file  . Years of education: Not on file  . Highest  education level: Not on file  Occupational History  . Not on file  Tobacco Use  . Smoking status: Former Smoker    Types: Cigars, Pipe  . Smokeless tobacco: Current User    Types: Snuff  . Tobacco comment: stopped smoking pipe tobacco 30 years ago  Vaping Use  . Vaping Use: Never used  Substance and Sexual Activity  . Alcohol use: Yes    Comment: Occasional Beer   . Drug use: Never  . Sexual activity: Not on file  Other Topics Concern  . Not on file  Social History Narrative  . Not on file   Social Determinants of Health   Financial Resource Strain:   . Difficulty of Paying Living Expenses: Not on file  Food Insecurity:   . Worried About Programme researcher, broadcasting/film/video in the Last Year: Not on file  . Ran Out of Food in the Last Year: Not on  file  Transportation Needs:   . Freight forwarder (Medical): Not on file  . Lack of Transportation (Non-Medical): Not on file  Physical Activity:   . Days of Exercise per Week: Not on file  . Minutes of Exercise per Session: Not on file  Stress:   . Feeling of Stress : Not on file  Social Connections:   . Frequency of Communication with Friends and Family: Not on file  . Frequency of Social Gatherings with Friends and Family: Not on file  . Attends Religious Services: Not on file  . Active Member of Clubs or Organizations: Not on file  . Attends Banker Meetings: Not on file  . Marital Status: Not on file     Family History: The patient's family history includes Blindness in his father; CAD in his father; Cancer in his father; Heart Problems in his mother; Hypertension in his father; Rheum arthritis in his mother. There is no history of Colon cancer, Esophageal cancer, Rectal cancer, or Stomach cancer. ROS:   Please see the history of present illness.    All other systems reviewed and are negative.  EKGs/Labs/Other Studies Reviewed:    The following studies were reviewed today:    Recent Labs: 09/22/2020: BUN 13;  Creatinine, Ser 0.94; Potassium 4.6; Sodium 139  Recent Lipid Panel No results found for: CHOL, TRIG, HDL, CHOLHDL, VLDL, LDLCALC, LDLDIRECT  Physical Exam:    VS:  BP 116/64 (BP Location: Left Arm, Patient Position: Sitting, Cuff Size: Normal)   Pulse (!) 57   Ht 5\' 6"  (1.676 m)   Wt 191 lb (86.6 kg)   SpO2 95%   BMI 30.83 kg/m     Wt Readings from Last 3 Encounters:  10/02/20 191 lb (86.6 kg)  09/12/20 188 lb (85.3 kg)  06/11/20 188 lb (85.3 kg)    He has full radial and ulnar pulses bilaterally GEN:  Well nourished, well developed in no acute distress HEENT: Normal NECK: No JVD; No carotid bruits LYMPHATICS: No lymphadenopathy CARDIAC: RRR, no murmurs, rubs, gallops RESPIRATORY:  Clear to auscultation without rales, wheezing or rhonchi  ABDOMEN: Soft, non-tender, non-distended MUSCULOSKELETAL:  No edema; No deformity  SKIN: Warm and dry NEUROLOGIC:  Alert and oriented x 3 PSYCHIATRIC:  Normal affect    Signed, 06/13/20, MD  10/02/2020 8:24 AM    [facility]

## 2020-10-02 NOTE — Patient Instructions (Signed)
Medication Instructions:  Your physician has recommended you make the following change in your medication:  START: TOPROL XL 25 mg take one tablet by mouth daily.  *If you need a refill on your cardiac medications before your next appointment, please call your pharmacy*   Lab Work: Your physician recommends that you return for lab work in: TODAY BMP, CBC If you have labs (blood work) drawn today and your tests are completely normal, you will receive your results only by: Marland Kitchen MyChart Message (if you have MyChart) OR . A paper copy in the mail If you have any lab test that is abnormal or we need to change your treatment, we will call you to review the results.   Testing/Procedures:    [facility] CARDIOVASCULAR DIVISION [facility] [place] [address] [place] [address] Dept: [phone] Loc: [phone]  Derek Moore  10/02/2020  You are scheduled for a Cardiac Catheterization on Wednesday, November 10 with Dr. Bryan Lemma.  1. Please arrive at the [facility] (Main Entrance A) at [facility]: [address] [place], [place] [address] at 9:30 AM (This time is two hours before your procedure to ensure your preparation). Free valet parking service is available.   Special note: Every effort is made to have your procedure done on time. Please understand that emergencies sometimes delay scheduled procedures.  2. Diet: Do not eat solid foods after midnight.  The patient may have clear liquids until 5am upon the day of the procedure.  3. Labs: You will need to have blood drawn on: You had these completed today 10/02/2020  4. Medication instructions in preparation for your procedure:   Contrast Allergy: No  Do not take Diabetes Med Glucophage (Metformin) on the day of the procedure and HOLD 48 HOURS AFTER THE PROCEDURE.  On the morning of your procedure, take your Aspirin and any morning medicines NOT listed above.  You may use  sips of water.  5. Plan for one night stay--bring personal belongings. 6. Bring a current list of your medications and current insurance cards. 7. You MUST have a responsible person to drive you home. 8. Someone MUST be with you the first 24 hours after you arrive home or your discharge will be delayed. 9. Please wear clothes that are easy to get on and off and wear slip-on shoes.  Thank you for allowing [place] to care for you!   -- [facility]    Follow-Up: At [facility], you and your health needs are our priority.  As part of our continuing mission to provide you with exceptional heart care, we have created designated Provider Care Teams.  These Care Teams include your primary Cardiologist (physician) and Advanced Practice Providers (APPs -  Physician Assistants and Nurse Practitioners) who all work together to provide you with the care you need, when you need it.  We recommend signing up for the patient portal called "MyChart".  Sign up information is provided on this After Visit Summary.  MyChart is used to connect with patients for Virtual Visits (Telemedicine).  Patients are able to view lab/test results, encounter notes, upcoming appointments, etc.  Non-urgent messages can be sent to your provider as well.   To learn more about what you can do with MyChart, go to [email or web address].    Your next appointment:   6 week(s)  The format for your next appointment:   In Person  Provider:   Norman Herrlich, MD  Other Instructions

## 2020-10-02 NOTE — Addendum Note (Signed)
Addended by: Delorse Limber I on: 10/02/2020 02:01 PM   Modules accepted: Orders

## 2020-10-02 NOTE — H&P (View-Only) (Signed)
Cardiology Office Note:    Date:  10/02/2020   ID:  [name], [name] 1956/07/21, MRN [id number]  PCP:  Derek Moore., MD  Cardiologist:  Derek Herrlich, MD    Referring MD: Derek Moore., MD    ASSESSMENT:    1. Coronary artery disease of native artery of native heart with stable angina pectoris (HCC)   2. Pure hypercholesterolemia   3. Type 2 diabetes mellitus without complication, with long-term current use of insulin (HCC)    PLAN:    In order of problems listed above:  1. His cardiac CTA reveals not only a very high calcium score with severe three-vessel coronary artery disease high risk anatomy.  Options benefits risks detailed undergo coronary angiography to explore options for revascularization.  He will continue aspirin statin diabetic medications and begin daily beta-blocker. 2. Continue statin 3. Continue current treatment   Next appointment: 6 weeks   Medication Adjustments/Labs and Tests Ordered: Current medicines are reviewed at length with the patient today.  Concerns regarding medicines are outlined above.  No orders of the defined types were placed in this encounter.  Meds ordered this encounter  Medications  . metoprolol succinate (TOPROL XL) 25 MG 24 hr tablet    Sig: Take 1 tablet (25 mg total) by mouth daily.    Dispense:  90 tablet    Refill:  3    No chief complaint on file.   History of Present Illness:    Derek Moore is a 64 y.o. male with a hx of chest pain hyperlipidemia and coronary artery calcification last seen 09/12/2020. Compliance with diet, lifestyle and medications: Yes  He has good healthcare literacy I reviewed the results of his cardiac CTA his calcium score is very high and he has severe three-vessel coronary disease FFR is reduced in the LAD left circumflex and the right coronary artery is occluded distally.  He has had no further chest pain however with his high risk cardiac CTA I advised him undergo coronary  angiography to look at his opportunities for treatment and potential revascularization.  He will continue aspirin as well as his high intensity statin initiate a beta-blocker.  Risk benefits and options detailed informed consent obtained  He was referred for cardiac CTA which showed a very high calcium score 739 88th percentile for age and sex he had moderate to severe multivessel CAD with 1 to 24% left main coronary artery 50 to 69% left anterior descending coronary artery stenosis proximal and mid vessel 25 to 49% left circumflex coronary artery and 50 to 69% right coronary artery stenosis.  Subsequent fractional flow reserve showed diminished flow with hemodynamic stenosis in the left anterior descending coronary artery and left circumflex coronary artery. Past Medical History:  Diagnosis Date  . Abnormal EKG 03/09/2018  . Acquired hypothyroidism 03/07/2018  . Allergy   . Aortic atherosclerosis (HCC) 07/17/2020   Formatting of this note might be different from the original. Se[facility] 06/2019  . Arthritis   . Asthma   . Cataract   . Chronic gout without tophus 03/07/2018  . Coronary artery calcification seen on CAT scan 07/17/2020   Formatting of this note might be different from the original. 06/2019. [facility]  . Depression 03/07/2018  . Diplopia 10/11/2018  . DISH (diffuse idiopathic skeletal hyperostosis) 07/17/2020   Formatting of this note might be different from the original. Seen on [facility] 06/2019  . Dizzy 05/09/2018  . Gastroesophageal reflux disease without esophagitis  03/07/2018   Formatting of this note might be different from the original. Long term  . GERD (gastroesophageal reflux disease) 03/07/2018  . Gout 03/07/2018  . Heel spur, left 02/07/2020  . Hyperlipidemia 03/07/2018  . Hypothyroidism (acquired) 03/07/2018   Formatting of this note might be different from the original. 1990s.  . Mixed hyperlipidemia 03/07/2018  . Ocular myasthenia gravis (HCC) 07/17/2020   Formatting of this note might be  different from the original. Ocular.  Follows at [facility].  Sees neurology and eye doctor.  . Palpitation 03/08/2018  . Plantar fasciitis 02/07/2020  . Primary osteoarthritis involving multiple joints 07/17/2020  . Smokeless tobacco use 07/17/2020   Formatting of this note might be different from the original. Discussed chantix.  He wants to use. Did well in past. Off label, he understands.  . Tightness of heel cord, left 02/07/2020  . Type 2 diabetes mellitus (HCC) 03/07/2018  . Type 2 diabetes mellitus without complication, without long-term current use of insulin (HCC) 03/07/2018   Formatting of this note might be different from the original. 2014.    Past Surgical History:  Procedure Laterality Date  . kidney stone extraction    . KNEE SURGERY    . NOSE SURGERY      Current Medications: Current Meds  Medication Sig  . aspirin EC 81 MG tablet Take 81 mg by mouth daily.  . empagliflozin (JARDIANCE) 10 MG TABS tablet Take 10 mg by mouth daily.  Marland Kitchen levothyroxine (SYNTHROID) 125 MCG tablet Take 125 mcg by mouth daily.  . metFORMIN (GLUCOPHAGE) 1000 MG tablet Take 1,000 mg by mouth 2 (two) times daily with a meal.  . Omega-3 Fatty Acids (FISH OIL PO) Take 1 tablet by mouth 2 (two) times daily.  . pantoprazole (PROTONIX) 40 MG tablet Take 40 mg by mouth daily.  Marland Kitchen PARoxetine (PAXIL) 40 MG tablet Take 40 mg by mouth daily.  Marland Kitchen pyridostigmine (MESTINON) 60 MG tablet Take 60 mg by mouth in the morning, at noon, and at bedtime.  . rosuvastatin (CRESTOR) 10 MG tablet Take 1 tablet (10 mg total) by mouth 3 (three) times a week.  . Semaglutide,0.25 or 0.5MG /DOS, (OZEMPIC, 0.25 OR 0.5 MG/DOSE,) 2 MG/1.5ML SOPN Inject 0.25 mg into the skin once a week.     Allergies:   Codeine, Hydrocodone-acetaminophen, Other, and Latex   Social History   Socioeconomic History  . Marital status: Married    Spouse name: Not on file  . Number of children: Not on file  . Years of education: Not on file  . Highest  education level: Not on file  Occupational History  . Not on file  Tobacco Use  . Smoking status: Former Smoker    Types: Cigars, Pipe  . Smokeless tobacco: Current User    Types: Snuff  . Tobacco comment: stopped smoking pipe tobacco 30 years ago  Vaping Use  . Vaping Use: Never used  Substance and Sexual Activity  . Alcohol use: Yes    Comment: Occasional Beer   . Drug use: Never  . Sexual activity: Not on file  Other Topics Concern  . Not on file  Social History Narrative  . Not on file   Social Determinants of Health   Financial Resource Strain:   . Difficulty of Paying Living Expenses: Not on file  Food Insecurity:   . Worried About Programme researcher, broadcasting/film/video in the Last Year: Not on file  . Ran Out of Food in the Last Year: Not on  file  Transportation Needs:   . Freight forwarder (Medical): Not on file  . Lack of Transportation (Non-Medical): Not on file  Physical Activity:   . Days of Exercise per Week: Not on file  . Minutes of Exercise per Session: Not on file  Stress:   . Feeling of Stress : Not on file  Social Connections:   . Frequency of Communication with Friends and Family: Not on file  . Frequency of Social Gatherings with Friends and Family: Not on file  . Attends Religious Services: Not on file  . Active Member of Clubs or Organizations: Not on file  . Attends Banker Meetings: Not on file  . Marital Status: Not on file     Family History: The patient's family history includes Blindness in his father; CAD in his father; Cancer in his father; Heart Problems in his mother; Hypertension in his father; Rheum arthritis in his mother. There is no history of Colon cancer, Esophageal cancer, Rectal cancer, or Stomach cancer. ROS:   Please see the history of present illness.    All other systems reviewed and are negative.  EKGs/Labs/Other Studies Reviewed:    The following studies were reviewed today:    Recent Labs: 09/22/2020: BUN 13;  Creatinine, Ser 0.94; Potassium 4.6; Sodium 139  Recent Lipid Panel No results found for: CHOL, TRIG, HDL, CHOLHDL, VLDL, LDLCALC, LDLDIRECT  Physical Exam:    VS:  BP 116/64 (BP Location: Left Arm, Patient Position: Sitting, Cuff Size: Normal)   Pulse (!) 57   Ht 5\' 6"  (1.676 m)   Wt 191 lb (86.6 kg)   SpO2 95%   BMI 30.83 kg/m     Wt Readings from Last 3 Encounters:  10/02/20 191 lb (86.6 kg)  09/12/20 188 lb (85.3 kg)  06/11/20 188 lb (85.3 kg)    He has full radial and ulnar pulses bilaterally GEN:  Well nourished, well developed in no acute distress HEENT: Normal NECK: No JVD; No carotid bruits LYMPHATICS: No lymphadenopathy CARDIAC: RRR, no murmurs, rubs, gallops RESPIRATORY:  Clear to auscultation without rales, wheezing or rhonchi  ABDOMEN: Soft, non-tender, non-distended MUSCULOSKELETAL:  No edema; No deformity  SKIN: Warm and dry NEUROLOGIC:  Alert and oriented x 3 PSYCHIATRIC:  Normal affect    Signed, 06/13/20, MD  10/02/2020 8:24 AM    [facility]

## 2020-10-03 ENCOUNTER — Telehealth: Payer: Self-pay

## 2020-10-03 LAB — CBC
Hematocrit: 46.9 % (ref 37.5–51.0)
Hemoglobin: 15.5 g/dL (ref 13.0–17.7)
MCH: 30.9 pg (ref 26.6–33.0)
MCHC: 33 g/dL (ref 31.5–35.7)
MCV: 94 fL (ref 79–97)
Platelets: 156 10*3/uL (ref 150–450)
RBC: 5.01 x10E6/uL (ref 4.14–5.80)
RDW: 12.9 % (ref 11.6–15.4)
WBC: 4.6 10*3/uL (ref 3.4–10.8)

## 2020-10-03 LAB — BASIC METABOLIC PANEL
BUN/Creatinine Ratio: 16 (ref 10–24)
BUN: 15 mg/dL (ref 8–27)
CO2: 24 mmol/L (ref 20–29)
Calcium: 9.9 mg/dL (ref 8.6–10.2)
Chloride: 102 mmol/L (ref 96–106)
Creatinine, Ser: 0.93 mg/dL (ref 0.76–1.27)
GFR calc Af Amer: 99 mL/min/{1.73_m2} (ref >59)
GFR calc non Af Amer: 86 mL/min/{1.73_m2} (ref >59)
Glucose: 154 mg/dL — ABNORMAL HIGH (ref 65–99)
Potassium: 4.4 mmol/L (ref 3.5–5.2)
Sodium: 139 mmol/L (ref 134–144)

## 2020-10-03 NOTE — Telephone Encounter (Signed)
Spoke with patient regarding results and recommendation.  Patient verbalizes understanding and is agreeable to plan of care. Advised patient to call back with any issues or concerns.  

## 2020-10-06 ENCOUNTER — Other Ambulatory Visit (HOSPITAL_COMMUNITY): Payer: Commercial Managed Care - PPO

## 2020-10-06 ENCOUNTER — Telehealth: Payer: Self-pay | Admitting: *Deleted

## 2020-10-06 ENCOUNTER — Other Ambulatory Visit (HOSPITAL_COMMUNITY)
Admission: RE | Admit: 2020-10-06 | Discharge: 2020-10-06 | Disposition: A | Discharge status: Home / Self Care | Payer: Commercial Managed Care - PPO | Source: Ambulatory Visit | Attending: Cardiology | Admitting: Cardiology

## 2020-10-06 DIAGNOSIS — Z20822 Contact with and (suspected) exposure to covid-19: Secondary | ICD-10-CM | POA: Insufficient documentation

## 2020-10-06 DIAGNOSIS — Z01812 Encounter for preprocedural laboratory examination: Secondary | ICD-10-CM | POA: Insufficient documentation

## 2020-10-06 LAB — SARS CORONAVIRUS 2 (TAT 6-24 HRS): SARS Coronavirus 2: NEGATIVE

## 2020-10-06 NOTE — Telephone Encounter (Signed)
Pt contacted pre-catheterization scheduled at [facility] for:  Wednesday October 08, 2020 11:30 AM Verified arrival time and place: [facility] Main Entrance A [facility]) at: 9:30 AM   No solid food after midnight prior to cath, clear liquids until 5 AM day of procedure.  Hold: Metformin-day of procedure and 48 hours post procedure Jardiance-AM of procedure Ozempic-usually takes Wednesdays-pt will take Tuesday or Thursday this week.  Except hold medications AM meds can be  taken pre-cath with sips of water including: ASA 81 mg   Confirmed patient has responsible adult to drive home post procedure and be with patient first 24 hours after arriving home: yes  You are allowed ONE visitor in the waiting room during the time you are at the hospital for your procedure. Both you and your visitor must wear a mask once you enter the hospital.       COVID-19 Pre-Screening Questions:   In the past 14 days have you had a new cough, new headache, new nasal congestion, fever (100.4 or greater) unexplained body aches, new sore throat, or sudden loss of taste or sense of smell? no  In the past 14 days have you been around anyone with known Covid 19? no   Reviewed procedure/mask/visitor instructions, COVID-19 questions with patient.

## 2020-10-07 ENCOUNTER — Other Ambulatory Visit (HOSPITAL_COMMUNITY): Payer: Commercial Managed Care - PPO

## 2020-10-08 ENCOUNTER — Ambulatory Visit (HOSPITAL_COMMUNITY)
Admission: RE | Admit: 2020-10-08 | Discharge: 2020-10-08 | Disposition: A | Discharge status: Home / Self Care | Payer: Commercial Managed Care - PPO | Attending: Cardiology | Admitting: Cardiology

## 2020-10-08 ENCOUNTER — Other Ambulatory Visit: Payer: Self-pay

## 2020-10-08 ENCOUNTER — Encounter (HOSPITAL_COMMUNITY)
Admission: RE | Disposition: A | Discharge status: Home / Self Care | Payer: Self-pay | Source: Home / Self Care | Attending: Cardiology

## 2020-10-08 DIAGNOSIS — I2582 Chronic total occlusion of coronary artery: Secondary | ICD-10-CM | POA: Diagnosis not present

## 2020-10-08 DIAGNOSIS — I25119 Atherosclerotic heart disease of native coronary artery with unspecified angina pectoris: Secondary | ICD-10-CM | POA: Diagnosis not present

## 2020-10-08 DIAGNOSIS — Z79899 Other long term (current) drug therapy: Secondary | ICD-10-CM | POA: Insufficient documentation

## 2020-10-08 DIAGNOSIS — I25118 Atherosclerotic heart disease of native coronary artery with other forms of angina pectoris: Secondary | ICD-10-CM | POA: Diagnosis present

## 2020-10-08 DIAGNOSIS — Z888 Allergy status to other drugs, medicaments and biological substances status: Secondary | ICD-10-CM | POA: Insufficient documentation

## 2020-10-08 DIAGNOSIS — Z885 Allergy status to narcotic agent status: Secondary | ICD-10-CM | POA: Insufficient documentation

## 2020-10-08 DIAGNOSIS — R931 Abnormal findings on diagnostic imaging of heart and coronary circulation: Secondary | ICD-10-CM

## 2020-10-08 DIAGNOSIS — F17290 Nicotine dependence, other tobacco product, uncomplicated: Secondary | ICD-10-CM | POA: Diagnosis not present

## 2020-10-08 DIAGNOSIS — Z7984 Long term (current) use of oral hypoglycemic drugs: Secondary | ICD-10-CM | POA: Diagnosis not present

## 2020-10-08 DIAGNOSIS — E782 Mixed hyperlipidemia: Secondary | ICD-10-CM | POA: Insufficient documentation

## 2020-10-08 DIAGNOSIS — E119 Type 2 diabetes mellitus without complications: Secondary | ICD-10-CM | POA: Insufficient documentation

## 2020-10-08 DIAGNOSIS — Z91040 Latex allergy status: Secondary | ICD-10-CM | POA: Insufficient documentation

## 2020-10-08 DIAGNOSIS — Z7982 Long term (current) use of aspirin: Secondary | ICD-10-CM | POA: Insufficient documentation

## 2020-10-08 DIAGNOSIS — I25110 Atherosclerotic heart disease of native coronary artery with unstable angina pectoris: Secondary | ICD-10-CM

## 2020-10-08 HISTORY — PX: LEFT HEART CATH AND CORONARY ANGIOGRAPHY: CATH118249

## 2020-10-08 LAB — POCT ACTIVATED CLOTTING TIME: Activated Clotting Time: 263 seconds

## 2020-10-08 LAB — GLUCOSE, CAPILLARY: Glucose-Capillary: 159 mg/dL — ABNORMAL HIGH (ref 70–99)

## 2020-10-08 LAB — CARDIAC CATHETERIZATION: Cath EF Quantitative: 45 %

## 2020-10-08 SURGERY — LEFT HEART CATH AND CORONARY ANGIOGRAPHY
Anesthesia: LOCAL

## 2020-10-08 MED ORDER — LABETALOL HCL 5 MG/ML IV SOLN: 10.0000 mg | INTRAVENOUS | Status: DC | PRN

## 2020-10-08 MED ORDER — VERAPAMIL HCL 2.5 MG/ML IV SOLN
INTRAVENOUS | Status: AC
  Filled 2020-10-08: qty 2

## 2020-10-08 MED ORDER — SODIUM CHLORIDE 0.9 % IV SOLN: INTRAVENOUS | Status: DC

## 2020-10-08 MED ORDER — LIDOCAINE HCL (PF) 1 % IJ SOLN
INTRAMUSCULAR | Status: DC | PRN
  Administered 2020-10-08: 2 mL

## 2020-10-08 MED ORDER — ASPIRIN 81 MG PO CHEW: 81.0000 mg | CHEWABLE_TABLET | ORAL | Status: DC

## 2020-10-08 MED ORDER — SODIUM CHLORIDE 0.9 % WEIGHT BASED INFUSION: 1.0000 mL/kg/h | INTRAVENOUS | Status: DC

## 2020-10-08 MED ORDER — SODIUM CHLORIDE 0.9% FLUSH: 3.0000 mL | Freq: Two times a day (BID) | INTRAVENOUS | Status: DC

## 2020-10-08 MED ORDER — ONDANSETRON HCL 4 MG/2ML IJ SOLN: 4.0000 mg | Freq: Four times a day (QID) | INTRAMUSCULAR | Status: DC | PRN

## 2020-10-08 MED ORDER — HYDRALAZINE HCL 20 MG/ML IJ SOLN: 10.0000 mg | INTRAMUSCULAR | Status: DC | PRN

## 2020-10-08 MED ORDER — LIDOCAINE HCL (PF) 1 % IJ SOLN
INTRAMUSCULAR | Status: AC
  Filled 2020-10-08: qty 30

## 2020-10-08 MED ORDER — MIDAZOLAM HCL 2 MG/2ML IJ SOLN
INTRAMUSCULAR | Status: AC
  Filled 2020-10-08: qty 2

## 2020-10-08 MED ORDER — MIDAZOLAM HCL 2 MG/2ML IJ SOLN
INTRAMUSCULAR | Status: DC | PRN
  Administered 2020-10-08: 1 mg via INTRAVENOUS

## 2020-10-08 MED ORDER — SODIUM CHLORIDE 0.9 % WEIGHT BASED INFUSION
3.0000 mL/kg/h | INTRAVENOUS | Status: AC
  Administered 2020-10-08: 3 mL/kg/h via INTRAVENOUS

## 2020-10-08 MED ORDER — SODIUM CHLORIDE 0.9 % IV SOLN: 250.0000 mL | INTRAVENOUS | Status: DC | PRN

## 2020-10-08 MED ORDER — ACETAMINOPHEN 325 MG PO TABS: 650.0000 mg | ORAL_TABLET | ORAL | Status: DC | PRN

## 2020-10-08 MED ORDER — HEPARIN (PORCINE) IN NACL 1000-0.9 UT/500ML-% IV SOLN
INTRAVENOUS | Status: DC | PRN
  Administered 2020-10-08 (×2): 500 mL

## 2020-10-08 MED ORDER — HEPARIN SODIUM (PORCINE) 1000 UNIT/ML IJ SOLN
INTRAMUSCULAR | Status: DC | PRN
  Administered 2020-10-08: 5000 [IU] via INTRAVENOUS
  Administered 2020-10-08: 3000 [IU] via INTRAVENOUS
  Administered 2020-10-08: 5000 [IU] via INTRAVENOUS

## 2020-10-08 MED ORDER — SODIUM CHLORIDE 0.9 % IV SOLN
INTRAVENOUS | Status: AC | PRN
  Administered 2020-10-08: 1 mL/kg/h via INTRAVENOUS

## 2020-10-08 MED ORDER — VERAPAMIL HCL 2.5 MG/ML IV SOLN
INTRAVENOUS | Status: DC | PRN
  Administered 2020-10-08: 10 mL via INTRA_ARTERIAL

## 2020-10-08 MED ORDER — HEPARIN (PORCINE) IN NACL 1000-0.9 UT/500ML-% IV SOLN
INTRAVENOUS | Status: AC
  Filled 2020-10-08: qty 1000

## 2020-10-08 MED ORDER — SODIUM CHLORIDE 0.9% FLUSH: 3.0000 mL | INTRAVENOUS | Status: DC | PRN

## 2020-10-08 MED ORDER — IOHEXOL 350 MG/ML SOLN
INTRAVENOUS | Status: DC | PRN
  Administered 2020-10-08: 90 mL

## 2020-10-08 MED ORDER — HEPARIN SODIUM (PORCINE) 1000 UNIT/ML IJ SOLN
INTRAMUSCULAR | Status: AC
  Filled 2020-10-08: qty 1

## 2020-10-08 MED ORDER — ASPIRIN 81 MG PO CHEW
81.0000 mg | CHEWABLE_TABLET | ORAL | Status: AC
  Administered 2020-10-08: 81 mg via ORAL
  Filled 2020-10-08: qty 1

## 2020-10-08 SURGICAL SUPPLY — 13 items
CATH OPTITORQUE TIG 4.0 5F (CATHETERS) ×2 IMPLANT
CATH VISTA GUIDE 6FR XB3.5 (CATHETERS) ×2 IMPLANT
DEVICE RAD COMP TR BAND LRG (VASCULAR PRODUCTS) ×1 IMPLANT
GLIDESHEATH SLEND SS 6F .021 (SHEATH) ×2 IMPLANT
GUIDEWIRE INQWIRE 1.5J.035X260 (WIRE) IMPLANT
GUIDEWIRE PRESSURE COMET II (WIRE) ×2 IMPLANT
INQWIRE 1.5J .035X260CM (WIRE) ×2
KIT ENCORE 26 ADVANTAGE (KITS) ×2 IMPLANT
KIT HEART LEFT (KITS) ×2 IMPLANT
PACK CARDIAC CATHETERIZATION (CUSTOM PROCEDURE TRAY) ×2 IMPLANT
SHEATH PROBE COVER 6X72 (BAG) ×1 IMPLANT
TRANSDUCER W/STOPCOCK (MISCELLANEOUS) ×2 IMPLANT
TUBING CIL FLEX 10 FLL-RA (TUBING) ×2 IMPLANT

## 2020-10-08 NOTE — Progress Notes (Signed)
[facility] consulted for CABG outpatient CABG evaluation. [facility] office will call pt with an appointment.

## 2020-10-08 NOTE — Discharge Instructions (Signed)
DRINK PLENTY OF FLUIDS FOR THE NEXT 2-3 DAYS. ° °KEEP ARM ELEVATED THE REMAINDER OF THE DAY. ° °HOLD METFORMIN FOR A FULL 48 HOURS AFTER DISCHARGE. ° ° °Radial Site Care ° °This sheet gives you information about how to care for yourself after your procedure. Your health care provider may also give you more specific instructions. If you have problems or questions, contact your health care provider. °What can I expect after the procedure? °After the procedure, it is common to have: °· Bruising and tenderness at the catheter insertion area. °Follow these instructions at home: °Medicines °· Take over-the-counter and prescription medicines only as told by your health care provider. °Insertion site care °1. Follow instructions from your health care provider about how to take care of your insertion site. Make sure you: °? Wash your hands with soap and water before you change your bandage (dressing). If soap and water are not available, use hand sanitizer. °? Change your dressing as told by your health care provider. °2. Check your insertion site every day for signs of infection. Check for: °? Redness, swelling, or pain. °? Fluid or blood. °? Pus or a bad smell. °? Warmth. °3. Do not take baths, swim, or use a hot tub for 5 days. °4. You may shower 24-48 hours after the procedure. °? Remove the dressing and gently wash the site with plain soap and water. °? Pat the area dry with a clean towel. °? Do not rub the site. That could cause bleeding. °5. Do not apply powder or lotion to the site. °Activity ° °1. For 24 hours after the procedure, or as directed by your health care provider: °? Do not flex or bend the affected arm. °? Do not push or pull heavy objects with the affected arm. °? Do not drive yourself home from the hospital or clinic. You may drive 24 hours after the procedure. °? Do not operate machinery or power tools. °2. Do not push, pull or lift anything that is heavier than 10 lb for 5 days. °3. Ask your health  care provider when it is okay to: °? Return to work or school. °? Resume usual physical activities or sports. °? Resume sexual activity. °General instructions °· If the catheter site starts to bleed, raise your arm and put firm pressure on the site. If the bleeding does not stop, get help right away. This is a medical emergency. °· If you went home on the same day as your procedure, a responsible adult should be with you for the first 24 hours after you arrive home. °· Keep all follow-up visits as told by your health care provider. This is important. °Contact a health care provider if: °· You have a fever. °· You have redness, swelling, or yellow drainage around your insertion site. °Get help right away if: °· You have unusual pain at the radial site. °· The catheter insertion area swells very fast. °· The insertion area is bleeding, and the bleeding does not stop when you hold steady pressure on the area. °· Your arm or hand becomes pale, cool, tingly, or numb. °These symptoms may represent a serious problem that is an emergency. Do not wait to see if the symptoms will go away. Get medical help right away. Call your local emergency services (911 in the [place] Do not drive yourself to the hospital. °Summary °· After the procedure, it is common to have bruising and tenderness at the site. °· Follow instructions from your health care provider   about how to take care of your radial site wound. Check the wound every day for signs of infection. °· Do not push, pull or lift anything that is heavier than 10 lb for 5 days. ° °This information is not intended to replace advice given to you by your health care provider. Make sure you discuss any questions you have with your health care provider. °Document Revised: 12/21/2017 Document Reviewed: 12/21/2017 °[employer]vier Patient Education © 2020 [employer] °

## 2020-10-08 NOTE — Progress Notes (Signed)
Assumed care of patient from Holley Dexter, RN. Assessment documented. No change noted.

## 2020-10-08 NOTE — Interval H&P Note (Signed)
History and Physical Interval Note:  10/08/2020 12:17 PM  Derek Moore  has presented today for surgery, with the diagnosis of CAD -abnormal cardiac CTA.  The various methods of treatment have been discussed with the patient and family. After consideration of risks, benefits and other options for treatment, the patient has consented to  Procedure(s): LEFT HEART CATH AND CORONARY ANGIOGRAPHY (N/A)  PERCUTANEOUS CORONARY INTERVENTION   as a surgical intervention.  The patient's history has been reviewed, patient examined, no change in status, stable for surgery.  I have reviewed the patient's chart and labs.  Questions were answered to the patient's satisfaction.    Cath Lab Visit (complete for each Cath Lab visit)  Clinical Evaluation Leading to the Procedure:   ACS: No.  Non-ACS:    Anginal Classification: CCS I  Anti-ischemic medical therapy: Minimal Therapy (1 class of medications)  Non-Invasive Test Results: High-risk stress test findings: cardiac mortality >3%/year  Prior CABG: No previous CABG   Bryan Lemma

## 2020-10-09 ENCOUNTER — Encounter (HOSPITAL_COMMUNITY): Payer: Self-pay | Admitting: Cardiology

## 2020-10-15 ENCOUNTER — Other Ambulatory Visit: Payer: Self-pay

## 2020-10-15 ENCOUNTER — Institutional Professional Consult (permissible substitution) (INDEPENDENT_AMBULATORY_CARE_PROVIDER_SITE_OTHER): Payer: Commercial Managed Care - PPO | Admitting: Surgery

## 2020-10-15 ENCOUNTER — Encounter: Payer: Self-pay | Admitting: Surgery

## 2020-10-15 ENCOUNTER — Encounter: Payer: Self-pay | Admitting: *Deleted

## 2020-10-15 ENCOUNTER — Other Ambulatory Visit: Payer: Self-pay | Admitting: *Deleted

## 2020-10-15 VITALS — BP 106/66 | HR 56 | Resp 18 | Ht 66.0 in | Wt 193.6 lb

## 2020-10-15 DIAGNOSIS — I2510 Atherosclerotic heart disease of native coronary artery without angina pectoris: Secondary | ICD-10-CM

## 2020-10-15 DIAGNOSIS — I25118 Atherosclerotic heart disease of native coronary artery with other forms of angina pectoris: Secondary | ICD-10-CM | POA: Diagnosis not present

## 2020-10-15 NOTE — Progress Notes (Signed)
Cardiothoracic Surgery Consultation  PCP is Raina Mina., MD Referring Provider is Leonie Man, MD  Chief Complaint  Patient presents with  . Coronary Artery Disease    Surgical consult, Cath 10/08/20    HPI:  The patient is a 64 year old gentleman with a history of hyperlipidemia, type 2 diabetes, and remote smoking who recently fell off his [place]cat injuring left side of his chest.  His chest pain was felt to be musculoskeletal but he had an abnormal ECG.  A previous stress echocardiogram in 2019 was normal.  He had a gated cardiac CT with coronary CTA and fractional flow reserve performed on 09/29/2020.  This showed moderate to severe multivessel coronary disease with distal RCA occlusion and left-to-right collaterals.  The FFR study showed significant LAD stenosis with an FFR of 0.79 in the mid LAD.  There was a sharp drop off after the first diagonal branch.  The left circumflex had a distal FFR of 0.51.  The RCA was occluded distally.  He subsequently underwent cardiac catheterization on 10/08/2020 which showed severely diseased RCA that was occluded distally with filling of the posterior descending and posterior lateral branches by collaterals from the left circumflex.  The LAD had 70% proximal to mid stenosis with 60% stenosis of the first diagonal branch at its origin.  The left circumflex had 90% mid vessel stenosis with 90% stenosis of the first marginal branch coming off the same area.  Left ventricular ejection fraction was estimated 45% with mild global hypokinesis.  LVEDP was low.  The patient is here today with his wife.  He remains quite active but said that he does have exertional pain in his left upper arm which resolves with rest.  He has had no fatigue.  Denies orthopnea and PND.  He has had no pain or pressure in his chest.  He denies shortness of breath.  Past Medical History:  Diagnosis Date  . Abnormal EKG 03/09/2018  . Acquired hypothyroidism 03/07/2018  . Allergy    . Aortic atherosclerosis ([place]) 07/17/2020   Formatting of this note might be different from the original. Seen on CT [facility] 06/2019  . Arthritis   . Asthma   . Cataract   . Chronic gout without tophus 03/07/2018  . Coronary artery calcification seen on CAT scan 07/17/2020   Formatting of this note might be different from the original. 06/2019. [facility]  . Depression 03/07/2018  . Diplopia 10/11/2018  . DISH (diffuse idiopathic skeletal hyperostosis) 07/17/2020   Formatting of this note might be different from the original. Seen on [facility] 06/2019  . Dizzy 05/09/2018  . Gastroesophageal reflux disease without esophagitis 03/07/2018   Formatting of this note might be different from the original. Long term  . GERD (gastroesophageal reflux disease) 03/07/2018  . Gout 03/07/2018  . Heel spur, left 02/07/2020  . Hyperlipidemia 03/07/2018  . Hypothyroidism (acquired) 03/07/2018   Formatting of this note might be different from the original. 1990s.  . Mixed hyperlipidemia 03/07/2018  . Ocular myasthenia gravis ([place]) 07/17/2020   Formatting of this note might be different from the original. Ocular.  Follows at [facility].  Sees neurology and eye doctor.  . Palpitation 03/08/2018  . Plantar fasciitis 02/07/2020  . Primary osteoarthritis involving multiple joints 07/17/2020  . Smokeless tobacco use 07/17/2020   Formatting of this note might be different from the original. Discussed chantix.  He wants to use. Did well in past. Off label, he understands.  . Tightness of heel cord,  left 02/07/2020  . Type 2 diabetes mellitus ([place]) 03/07/2018  . Type 2 diabetes mellitus without complication, without long-term current use of insulin ([place]) 03/07/2018   Formatting of this note might be different from the original. 2014.    Past Surgical History:  Procedure Laterality Date  . kidney stone extraction    . KNEE SURGERY    . LEFT HEART CATH AND CORONARY ANGIOGRAPHY N/A 10/08/2020   Procedure: LEFT HEART CATH AND CORONARY ANGIOGRAPHY;  Surgeon:  Leonie Man, MD;  Location: [facility];  Service: Cardiovascular;  Laterality: N/A;  . NOSE SURGERY      Family History  Problem Relation Age of Onset  . Rheum arthritis Mother   . Heart Problems Mother   . Cancer Father   . Hypertension Father   . CAD Father   . Blindness Father   . Colon cancer Neg Hx   . Esophageal cancer Neg Hx   . Rectal cancer Neg Hx   . Stomach cancer Neg Hx     Social History Social History   Tobacco Use  . Smoking status: Former Smoker    Types: Cigars, Pipe  . Smokeless tobacco: Current User    Types: Snuff  . Tobacco comment: stopped smoking pipe tobacco 30 years ago  Vaping Use  . Vaping Use: Never used  Substance Use Topics  . Alcohol use: Yes    Comment: Occasional Beer   . Drug use: Never    Current Outpatient Medications  Medication Sig Dispense Refill  . acetaminophen (TYLENOL) 500 MG tablet Take 500-1,000 mg by mouth every 6 (six) hours as needed (for pain.).    Mar[place] Kitchen aspirin EC 81 MG tablet Take 81 mg by mouth every evening.     Marland Kitchen Dextran 70-Hypromellose, PF, (ARTIFICIAL TEARS PF) 0.1-0.3 % SOLN Place 1 drop into both eyes 3 (three) times daily as needed (dry/irritated eyes.).    Marland Kitchen empagliflozin (JARDIANCE) 10 MG TABS tablet Take 10 mg by mouth daily.    Marland Kitchen levothyroxine (SYNTHROID) 125 MCG tablet Take 125 mcg by mouth daily.    . metFORMIN (GLUCOPHAGE) 1000 MG tablet Take 1,000 mg by mouth 2 (two) times daily with a meal.    . metoprolol succinate (TOPROL XL) 25 MG 24 hr tablet Take 1 tablet (25 mg total) by mouth daily. 90 tablet 3  . Omega-3 Fatty Acids (FISH OIL ULTRA) 1400 MG CAPS Take 1,400 mg by mouth in the morning and at bedtime.    . pantoprazole (PROTONIX) 40 MG tablet Take 40 mg by mouth daily.    Marland Kitchen PARoxetine (PAXIL) 40 MG tablet Take 40 mg by mouth daily.    Marland Kitchen pyridostigmine (MESTINON) 60 MG tablet Take 60 mg by mouth in the morning, at noon, and at bedtime.    . rosuvastatin (CRESTOR) 10 MG tablet Take 1 tablet  (10 mg total) by mouth 3 (three) times a week. (Patient taking differently: Take 10 mg by mouth every Monday, Wednesday, and Friday at 8 PM. In the evening) 90 tablet 3  . Semaglutide,0.25 or 0.5MG/DOS, (OZEMPIC, 0.25 OR 0.5 MG/DOSE,) 2 MG/1.5ML SOPN Inject 0.5 mg into the skin every Wednesday.     . metoprolol tartrate (LOPRESSOR) 100 MG tablet Take 1 tablet (100 mg total) by mouth once for 1 dose. (Patient not taking: Reported on 10/02/2020) 1 tablet 0   No current facility-administered medications for this visit.    Allergies  Allergen Reactions  . Codeine Itching  . Hydrocodone-Acetaminophen Hives  and Itching  . Lipitor [Atorvastatin] Other (See Comments)    Aches and pains  . Latex Itching    Review of Systems  Constitutional: Negative for activity change and fatigue.  HENT: Negative.   Eyes: Negative.   Respiratory: Negative for shortness of breath.        History of asthma  Cardiovascular: Negative for chest pain, palpitations and leg swelling.  Gastrointestinal: Positive for diarrhea.       Reflux  Endocrine: Negative.   Genitourinary: Negative.   Musculoskeletal: Positive for arthralgias.  Skin: Negative.   Neurological: Negative.   Hematological: Negative.   Psychiatric/Behavioral: Negative.     BP 106/66 (BP Location: Right Arm, Patient Position: Sitting)   Pulse (!) 56   Resp 18   Ht _0  (1.676 m)   Wt 193 lb 9.6 oz (87.8 kg)   SpO2 95% Comment: RA with mask on  BMI 31.25 kg/m  Physical Exam Constitutional:      Appearance: Normal appearance. He is obese.     Comments: BMI 31.25 kg/m  HENT:     Head: Normocephalic and atraumatic.  Eyes:     Extraocular Movements: Extraocular movements intact.     Pupils: Pupils are equal, round, and reactive to light.  Neck:     Vascular: No carotid bruit.  Cardiovascular:     Rate and Rhythm: Normal rate and regular rhythm.     Pulses: Normal pulses.     Heart sounds: Normal heart sounds. No murmur heard.    Pulmonary:     Effort: Pulmonary effort is normal.     Breath sounds: Normal breath sounds.  Abdominal:     General: There is no distension.     Tenderness: There is no abdominal tenderness.  Musculoskeletal:        General: No swelling.  Skin:    General: Skin is warm and dry.  Neurological:     General: No focal deficit present.     Mental Status: He is alert and oriented to person, place, and time.  Psychiatric:        Mood and Affect: Mood normal.        Behavior: Behavior normal.      Diagnostic Tests:  Physicians  Panel Physicians Referring Physician Case Authorizing Physician  Leonie Man, MD (Primary)    Procedures  LEFT HEART CATH AND CORONARY ANGIOGRAPHY  Conclusion    There is mild left ventricular systolic dysfunction.  LV end diastolic pressure is normal.  There is no aortic valve stenosis.  Prox RCA lesion is 95% stenosed. Prox RCA to Mid RCA lesion is 50% stenosed. Mid RCA lesion is 70% stenosed. Mid RCA to Dist RCA lesion is 100% stenosed. (Fills via left-right collaterals from LCx)  Ost LM to Ost LAD lesion is 20% stenosed with 30% stenosed side branch in Ost Cx.  Ost LAD to Prox LAD lesion is 30% stenosed.  Mid Cx lesion is 90% stenosed with 90% stenosed side branch in 1st Mrg.  Mid Cx to Dist Cx lesion is 70% stenosed with 95% stenosed side branch in 2nd Mrg.  Prox LAD to Mid LAD lesion is 70% stenosed with 60% stenosed side branch in 1st Diag.   SUMMARY  Severe three-vessel disease:  ? Extensive proximal to mid 90 having 70 and 80% stenosis followed by 100% CTO (fills via left to right collaterals from AVG LCx);  ? LEFT MAIN diffuse 10 to 20% stenosis, 20% ostial LCx with 90% focal concentric  stenosis at [place] (1st Mrg) followed by a second 70% stenosis at a very small caliber OM 2,  ? Segmental calcified 40% proximal LAD with DFR positive long segment of 60-65% at the takeoff of D1 (DFR was 0.85, CT FFR was 0.79).  LAD after D1  (1st Diag) bifurcates more distally into tandem vessels of D2 and basely the septal LAD-is relatively small caliber  Mildly reduced LVEF with mild global hypokinesis EF roughly 45% by visual estimate.  Low LVEDP   RECOMMENDATION  He is relatively asymptomatic, recommend outpatient CVTS consultation -> need to consider the benefits of full revascularization with CABG versus PCI of the bifurcation LCx-OM1 lesion and bifurcation LAD-D1 lesion.  No antiplatelet agent besides aspirin with planned CVTS consultation  Continue aggressive [place] THERAPY  Follow-up with Dr. Bettina Gavia   That way Glenetta Hew, MD   Recommendations  Antiplatelet/Anticoag Recommend Aspirin 78m daily for moderate CAD.  Discharge Date In the absence of any other complications or medical issues, we expect the patient to be ready for discharge from a cath perspective on 10/08/2020. As he is asymptomatic, okay to schedule outpatient CVTS consultation.  The patient will determine his desires for CABG versus two-vessel (both bifurcation) PCI.  I explained to him the risks and benefits of PCI, but needs to hear the risk and benefits of CABG.  CABG would allow for complete revascularization.  Indications  Abnormal cardiac CT angiography [R93.1 (ICD-10-CM)]  Coronary artery disease involving native coronary artery of native heart with angina pectoris [place] [[T06.269(ICD-10-CM)]  Procedural Details  Technical Details PCP:  GRaina Mina, MD      Cardiologist:  BShirlee More MD     Derek Moore PAOLOis a 64y.o. male with a hx of chest pain hyperlipidemia and coronary artery calcification last seen 09/12/2020. He was referred for cardiac CTA which showed a very high calcium score 77188th percentile for age and sex he had moderate to severe multivessel CAD with 1 to 24% left main coronary artery 531to 69% left anterior descending coronary artery stenosis proximal and mid vessel 25 to 49% left  circumflex coronary artery and 50 to 69% right coronary artery stenosis.  Subsequent fractional flow reserve showed diminished flow with hemodynamic stenosis in the left anterior descending coronary artery and left circumflex coronary artery. He was seen by Dr. MBettina Gaviaand after reviewing the coronary CTA findings, was referred for invasive evaluation with cardiac catheterization.  Time Out: Verified patient identification, verified procedure, site/side was marked, verified correct patient position, special equipment/implants available, medications/allergies/relevent history reviewed, required imaging and test results available. Performed.  Access:  RIGHT Radial Artery: 6 Fr sheath -- Seldinger technique using Micropuncture Kit -- Direct ultrasound guidance used.  Permanent image obtained and placed on chart. -- 10 mL radial cocktail IA; 5000 Units IV Heparin  Left Heart Catheterization: 5 &6Fr Catheters advanced or exchanged over a J-wire under direct fluoroscopic guidance into the ascending aorta; TIG 4.0 catheter advanced first.  * LV Hemodynamics (LV Gram): TIG 4.0 Catheter * Left Coronary Artery Cineangiography: TIG 4.0 Catheter ; 6Fr XB3 Guide Catheter for DFR * Right Coronary Artery Cineangiography: TIG 4.0 Catheter   Review of initial angiography revealed: Occluded RCA with left to right collaterals.  Significant lesion in the proximal LCx but questionable ostial LCx lesion and mid LAD lesion. ->  CTA-FFR suggested mid LAD lesion was 0.79, however no clear obvious lesion is seen that would be greater than 50%..  Preparations are  made for DFR/FFR evaluation of concerning lesions.  DFR of LAD performed:  6Fr XB3 Guide Catheter for DFR, COMET-Pressurewire -> Additional bolus of IV heparin administered to treatment ACT> 250 seconds  Upon completion of post DFR angiography, the comet wire was removed, then the catheter was removed completely out of the body over a wire, without  complication.  Radial sheath removed in the Cardiac Catheterization lab with TR Band placed for hemostasis.  TR Band: 1340  Hours; 13 mL air  MEDICATIONS SQ Lidocaine 4 mL Radial Cocktail: 3 mg Verapmil in 10 mL NS Heparin: Total of 13,000 units IV NS 500 mL bolus Estimated blood loss <50 mL.   During this procedure medications were administered to achieve and maintain moderate conscious sedation while the patient's heart rate, blood pressure, and oxygen saturation were continuously monitored and I was present face-to-face 100% of this time.  Medications (Filter: Administrations occurring from 1217 to 1342 on 10/08/20) (important) Continuous medications are totaled by the amount administered until 10/08/20 1342.  midazolam (VERSED) injection (mg) Total dose:  1 mg Date/Time  Rate/Dose/Volume Action  10/08/20 1224  1 mg Given    Heparin (Porcine) in NaCl 1000-0.9 UT/500ML-% SOLN (mL) Total volume:  1,000 mL Date/Time  Rate/Dose/Volume Action  10/08/20 1225  500 mL Given  1225  500 mL Given    lidocaine (PF) (XYLOCAINE) 1 % injection (mL) Total volume:  2 mL Date/Time  Rate/Dose/Volume Action  10/08/20 1239  2 mL Given    Radial Cocktail/Verapamil only (mL) Total volume:  10 mL Date/Time  Rate/Dose/Volume Action  10/08/20 1240  10 mL Given    heparin sodium (porcine) injection (Units) Total dose:  13,000 Units Date/Time  Rate/Dose/Volume Action  10/08/20 1243  5,000 Units Given  1259  5,000 Units Given  1313  3,000 Units Given    0.9 % sodium chloride infusion (mL/kg/hr) Total dose:  Cannot be calculated* Dosing weight:  82.9 *Continuous medication not stopped within the calculation time range. Date/Time  Rate/Dose/Volume Action  10/08/20 1323  1 mL/kg/hr - 82.9 mL/hr New Bag/Given    iohexol (OMNIPAQUE) 350 MG/ML injection (mL) Total volume:  90 mL Date/Time  Rate/Dose/Volume Action  10/08/20 1338  90 mL Given    Sedation Time  Sedation Time Physician-1: 1  hour 14 minutes 36 seconds  Contrast  Medication Name Total Dose  iohexol (OMNIPAQUE) 350 MG/ML injection 90 mL    Radiation/Fluoro  Fluoro time: 8.3 (min) DAP: 21426 (mGycm2) Cumulative Air Kerma: 732 (mGy)  Complications  Complications documented before study signed (10/08/2020 2:02 PM)   No complications were associated with this study.  Documented by Leonie Man, MD - 10/08/2020 1:57 PM    Coronary Findings  Diagnostic Dominance: Right Left Main  Ost LM to Ost LAD lesion is 20% stenosed with 30% stenosed side branch in Ost Cx. The lesion is mildly calcified.  Left Anterior Descending  Vessel is small.  Ost LAD to Prox LAD lesion is 30% stenosed. The lesion is segmental. The lesion is calcified.  Prox LAD to Mid LAD lesion is 70% stenosed with 60% stenosed side branch in 1st Diag. The lesion is located at the bifurcation, eccentric, concentric and smooth. CTA FFR  First Diagonal Branch  Vessel is small in size.  First Septal Branch  Vessel is small in size.  Ramus Intermedius  Vessel is small.  Left Circumflex  Vessel is moderate in size.  Mid Cx lesion is 90% stenosed with 90% stenosed side branch in  1st Mrg. The lesion is located at the bifurcation.  Mid Cx to Dist Cx lesion is 70% stenosed with 95% stenosed side branch in 2nd Mrg. The lesion is focal and concentric.  First Obtuse Marginal Branch  Vessel is small in size.  Second Obtuse Marginal Branch  Vessel is small in size.  First Left Posterolateral Branch  Vessel is small in size.  Second Left Posterolateral Branch  Vessel is small in size.  Left Posterior Atrioventricular Artery  Vessel is large in size.  Right Coronary Artery  Prox RCA lesion is 95% stenosed. The lesion is segmental and eccentric.  Prox RCA to Mid RCA lesion is 50% stenosed. The lesion is segmental and irregular.  Mid RCA lesion is 70% stenosed. The lesion is focal and concentric.  Mid RCA to Dist RCA lesion is 100% stenosed.  The lesion is chronically occluded.  Acute Marginal Branch  Vessel is small in size.  Right Ventricular Branch  Vessel is small in size.  Right Posterior Atrioventricular Artery  Collaterals  RPAV filled by collaterals from [place].    First Right Posterolateral Branch  Vessel is small in size.  Second Right Posterolateral Branch  Vessel is moderate in size.  Intervention  No interventions have been documented. Wall Motion  Resting       All segments of the heart are hypokinetic.  Imaging not adequate to evaluate regional wall motion abnormality          Left Heart  Left Ventricle The left ventricular size is in the upper limits of normal. There is mild left ventricular systolic dysfunction. LV end diastolic pressure is normal. Calculated EF is 45%. There are LV function abnormalities due to global hypokinesis.  Aortic Valve There is no aortic valve stenosis. There is normal aortic valve motion.  Coronary Diagrams  Diagnostic Dominance: Right  Intervention  Implants   No implant documentation for this case.  Syngo Images  Show images for CARDIAC CATHETERIZATION Images on Long Term Storage  Show images for Kia, Stavros "Ronalee Belts" Link to Procedure Log  Procedure Log    Hemo Data   Most Recent Value  AO Systolic Pressure 79 mmHg  AO Diastolic Pressure 43 mmHg  AO Mean 57 mmHg  LV Systolic Pressure 87 mmHg  LV Diastolic Pressure 3 mmHg  LV EDP 9 mmHg  AOp Systolic Pressure 81 mmHg  AOp Diastolic Pressure 43 mmHg  AOp Mean Pressure 58 mmHg  LVp Systolic Pressure 82 mmHg  LVp Diastolic Pressure 4 mmHg  LVp EDP Pressure 9 mmHg   EXAM: CT FFR ANALYSIS  CLINICAL DATA:  ECG abnormal, 69yrCHD risk 10-20%, not treadmill candidate Chest pain/anginal equiv, 121yrHD risk > 20%, not treadmill candidate  FINDINGS: FFRct analysis was performed on the original cardiac CT angiogram dataset. Diagrammatic representation of the FFRct analysis is provided in a  separate PDF document in PACS. This dictation was created using the PDF document and an interactive 3D model of the results. 3D model is not available in the EMR/PACS. Normal FFR range is >0.80.  1. Left Main:  No significant stenosis. FFR = 0.99  2. LAD: Significant stenosis. Proximal FFR = 0.94, Mid FFR = 0.79 (sharp drop after the takeoff of D1), Distal FFR = 0.76 3. LCX: Significant stenosis. Proximal FFR = 0.96, Distal FFR = 0.51 4. RCA: Significant stenosis. Proximal FFR = 1.00, Mid FFR = 0.85 (sharp drop after the proximal stenosis), Distal FFR = Occluded  IMPRESSION: 1. CT FFR analysis  demonstrates significant multivessel CAD, including an occluded distal RCA (suspect collateralized), significant mid-LAD stenosis and significant proximal to mid LCX stenosis.  2.  Cardiac catheterization is recommended.   Electronically Signed   By: Pixie Casino M.D.   On: 09/30/2020 08:29  Impression:  This 65 year old diabetic gentleman has severe three-vessel coronary disease with mild left ventricular systolic dysfunction and mild anginal symptoms consisting of left arm aching pain with exertion.  He is still quite active.  I think revascularization would be the best treatment for him since he has a large myocardial territory at risk with high-grade left circumflex stenosis compromising the lateral wall as well as the inferior wall through collaterals to the distal RCA.  His left circumflex and LAD stenoses are at bifurcations and I think percutaneous intervention would be higher risk due to the possibility of occluding blood supply to the diagonal and obtuse marginal branches.  I think his best long-term prognosis will be with coronary bypass graft surgery.  I reviewed the cardiac catheterization films with the patient and his wife answered their questions.  They are in agreement with proceeding with coronary bypass surgery. I discussed the operative procedure with the patient and  his wife including alternatives, benefits and risks; including but not limited to bleeding, blood transfusion, infection, stroke, myocardial infarction, graft failure, heart block requiring a permanent pacemaker, organ dysfunction, and death.  Alyson Locket Landrigan understands and agrees to proceed.   Plan:  We will schedule coronary bypass graft surgery on Monday, 10/27/2020.  He will have upper extremity arterial Dopplers to decide if we can use his left radial artery.   I spent 60 minutes performing this consultation and > 50% of this time was spent face to face counseling and coordinating the care of this patient's severe multivessel coronary disease.   Gaye Pollack, MD [facility] [phone]

## 2020-10-22 NOTE — Pre-Procedure Instructions (Signed)
Your procedure is scheduled on Mon., Nov. 29, 2021 from 7:30AM-12:54PM  Report to [facility] Entrance "A" at 5:30AM  Call this number if you have problems the morning of surgery:  [phone]   Remember:  Do not eat or drink after midnight on Nov. 28th    Take these medicines the morning of surgery with A SIP OF WATER: Levothyroxine (SYNTHROID)     Metoprolol succinate (TOPROL XL)     Pantoprazole (PROTONIX)  PARoxetine (PAXIL)  Pyridostigmine (MESTINON)   If Needed: Acetaminophen (TYLENOL)  Follow your surgeon's instructions on when to stop Aspirin.  If no instructions were given by your surgeon then you will need to call the office to get those instructions.     As of today, STOP taking all Aspirin (unless instructed by your doctor) and Other Aspirin containing products, Vitamins, Fish oils, and Herbal medications. Also stop all NSAIDS i.e. Advil, Ibuprofen, Motrin, Aleve, Anaprox, Naproxen, BC, Goody Powders, and all Supplements.   WHAT DO I DO ABOUT MY DIABETES MEDICATION?   Take last dose of empagliflozin (JARDIANCE) on Saturday Nov. 27th   Do not take metFORMIN (GLUCOPHAGE) the morning of surgery.   The day of surgery, do not take other diabetes injectable Semaglutide (OZEMPIC)  How to Manage Your Diabetes Before and After Surgery  Why is it important to control my blood sugar before and after surgery?  Improving blood sugar levels before and after surgery helps healing and can limit problems.  A way of improving blood sugar control is eating a healthy diet by: o  Eating less sugar and carbohydrates o  Increasing activity/exercise o  Talking with your doctor about reaching your blood sugar goals  High blood sugars (greater than 180 mg/dL) can raise your risk of infections and slow your recovery, so you will need to focus on controlling your diabetes during the weeks before surgery.  Make sure that the doctor who takes care of your diabetes knows  about your planned surgery including the date and location.  How do I manage my blood sugar before surgery?  Check your blood sugar at least 4 times a day, starting 2 days before surgery, to make sure that the level is not too high or low. o Check your blood sugar the morning of your surgery when you wake up and every 2 hours until you get to the Short Stay unit.  If your blood sugar is less than 70 mg/dL, you will need to treat for low blood sugar: o Do not take insulin. o Treat a low blood sugar (less than 70 mg/dL) with  cup of clear juice (cranberry or apple), 4 glucose tablets, OR glucose gel. Recheck blood sugar in 15 minutes after treatment (to make sure it is greater than 70 mg/dL). If your blood sugar is not greater than 70 mg/dL on recheck, call [phone] o  for further instructions.  If your CBG is greater than 220 mg/dL, inform the staff upon arrival to Short Stay.   If you are admitted to the hospital after surgery: o Your blood sugar will be checked by the staff and you will probably be given insulin after surgery (instead of oral diabetes medicines) to make sure you have good blood sugar levels. o The goal for blood sugar control after surgery is 80-180 mg/dL.  Reviewed and Endorsed by [facility] Patient Education Committee, August 2015   No Smoking of any kind, Tobacco/Vaping, or Alcohol products 24 hours prior to your procedure. If you  use a Cpap at night, you may bring all equipment for your overnight stay.   Special instructions:   [place]- Preparing For Surgery  Before surgery, you can play an important role. Because skin is not sterile, your skin needs to be as free of germs as possible. You can reduce the number of germs on your skin by washing with CHG (chlorahexidine gluconate) Soap before surgery.  CHG is an antiseptic cleaner which kills germs and bonds with the skin to continue killing germs even after washing.    Please do not use if you have an  allergy to CHG or antibacterial soaps. If your skin becomes reddened/irritated stop using the CHG.  Do not shave (including legs and underarms) for at least 48 hours prior to first CHG shower. It is OK to shave your face.  Please follow these instructions carefully.   1. Shower the NIGHT BEFORE SURGERY and the MORNING OF SURGERY with CHG.   2. If you chose to wash your hair, wash your hair first as usual with your normal shampoo.  3. After you shampoo, rinse your hair and body thoroughly to remove the shampoo.  4. Use CHG as you would any other liquid soap. You can apply CHG directly to the skin and wash gently with a scrungie or a clean washcloth.   5. Apply the CHG Soap to your body ONLY FROM THE NECK DOWN.  Do not use on open wounds or open sores. Avoid contact with your eyes, ears, mouth and genitals (private parts). Wash Face and genitals (private parts)  with your normal soap.  6. Wash thoroughly, paying special attention to the area where your surgery will be performed.  7. Thoroughly rinse your body with warm water from the neck down.  8. DO NOT shower/wash with your normal soap after using and rinsing off the CHG Soap.  9. Pat yourself dry with a CLEAN TOWEL.  10. Wear CLEAN PAJAMAS to bed the night before surgery, wear comfortable clothes the morning of surgery  11. Place CLEAN SHEETS on your bed the night of your first shower and DO NOT SLEEP WITH PETS.   Day of Surgery:             Remember to brush your teeth WITH YOUR REGULAR TOOTHPASTE.  Do not wear jewelry.  Do not wear lotions, powders, colognes, or deodorant.  Do not shave 48 hours prior to surgery.  Men may shave face and neck.  Do not bring valuables to the hospital.  [facility] is not responsible for any belongings or valuables.  Contacts, dentures or bridgework may not be worn into surgery.    For patients admitted to the hospital, discharge time will be determined by your treatment team.  Patients  discharged the day of surgery will not be allowed to drive home, and someone age 14 and over needs to stay with them for 24 hours.  Please wear clean clothes to the hospital/surgery center.    Please read over the following fact sheets that you were given.

## 2020-10-24 ENCOUNTER — Ambulatory Visit (HOSPITAL_COMMUNITY)
Admission: RE | Admit: 2020-10-24 | Discharge: 2020-10-24 | Disposition: A | Discharge status: Home / Self Care | Payer: Commercial Managed Care - PPO | Source: Ambulatory Visit | Attending: Surgery | Admitting: Surgery

## 2020-10-24 ENCOUNTER — Encounter (HOSPITAL_COMMUNITY)
Admission: RE | Admit: 2020-10-24 | Discharge: 2020-10-24 | Disposition: A | Discharge status: Home / Self Care | Payer: Commercial Managed Care - PPO | Source: Ambulatory Visit | Attending: Surgery | Admitting: Surgery

## 2020-10-24 ENCOUNTER — Encounter (HOSPITAL_COMMUNITY): Payer: Self-pay

## 2020-10-24 ENCOUNTER — Other Ambulatory Visit (HOSPITAL_COMMUNITY)
Admission: RE | Admit: 2020-10-24 | Discharge: 2020-10-24 | Disposition: A | Discharge status: Home / Self Care | Payer: Commercial Managed Care - PPO | Source: Ambulatory Visit | Attending: Surgery | Admitting: Surgery

## 2020-10-24 ENCOUNTER — Other Ambulatory Visit: Payer: Self-pay

## 2020-10-24 DIAGNOSIS — I2510 Atherosclerotic heart disease of native coronary artery without angina pectoris: Secondary | ICD-10-CM | POA: Diagnosis not present

## 2020-10-24 DIAGNOSIS — E785 Hyperlipidemia, unspecified: Secondary | ICD-10-CM | POA: Insufficient documentation

## 2020-10-24 DIAGNOSIS — I10 Essential (primary) hypertension: Secondary | ICD-10-CM | POA: Insufficient documentation

## 2020-10-24 DIAGNOSIS — E119 Type 2 diabetes mellitus without complications: Secondary | ICD-10-CM | POA: Insufficient documentation

## 2020-10-24 DIAGNOSIS — Z20822 Contact with and (suspected) exposure to covid-19: Secondary | ICD-10-CM | POA: Insufficient documentation

## 2020-10-24 HISTORY — DX: Pneumonia, unspecified organism: J18.9

## 2020-10-24 LAB — CBC
HCT: 45.2 % (ref 39.0–52.0)
Hemoglobin: 15.3 g/dL (ref 13.0–17.0)
MCH: 31.5 pg (ref 26.0–34.0)
MCHC: 33.8 g/dL (ref 30.0–36.0)
MCV: 93.2 fL (ref 80.0–100.0)
Platelets: 151 10*3/uL (ref 150–400)
RBC: 4.85 MIL/uL (ref 4.22–5.81)
RDW: 13.7 % (ref 11.5–15.5)
WBC: 5.4 10*3/uL (ref 4.0–10.5)
nRBC: 0 % (ref 0.0–0.2)

## 2020-10-24 LAB — URINALYSIS, ROUTINE W REFLEX MICROSCOPIC
Bacteria, UA: NONE SEEN
Bilirubin Urine: NEGATIVE
Glucose, UA: >=500 mg/dL — AB
Hgb urine dipstick: NEGATIVE
Ketones, ur: NEGATIVE mg/dL
Leukocytes,Ua: NEGATIVE
Nitrite: NEGATIVE
Protein, ur: NEGATIVE mg/dL
Specific Gravity, Urine: 1.022 (ref 1.005–1.030)
pH: 5 (ref 5.0–8.0)

## 2020-10-24 LAB — BLOOD GAS, ARTERIAL
Acid-base deficit: 2.1 mmol/L — ABNORMAL HIGH (ref 0.0–2.0)
Bicarbonate: 22.2 mmol/L (ref 20.0–28.0)
Drawn by: 602861
FIO2: 21
O2 Saturation: 97.1 %
Patient temperature: 37
pCO2 arterial: 38.2 mmHg (ref 32.0–48.0)
pH, Arterial: 7.383 (ref 7.350–7.450)
pO2, Arterial: 95.6 mmHg (ref 83.0–108.0)

## 2020-10-24 LAB — SURGICAL PCR SCREEN
MRSA, PCR: NEGATIVE
Staphylococcus aureus: NEGATIVE

## 2020-10-24 LAB — COMPREHENSIVE METABOLIC PANEL
ALT: 21 U/L (ref 0–44)
AST: 25 U/L (ref 15–41)
Albumin: 3.8 g/dL (ref 3.5–5.0)
Alkaline Phosphatase: 64 U/L (ref 38–126)
Anion gap: 11 (ref 5–15)
BUN: 16 mg/dL (ref 8–23)
CO2: 18 mmol/L — ABNORMAL LOW (ref 22–32)
Calcium: 9.3 mg/dL (ref 8.9–10.3)
Chloride: 106 mmol/L (ref 98–111)
Creatinine, Ser: 1.09 mg/dL (ref 0.61–1.24)
GFR, Estimated: >60 mL/min (ref >60)
Glucose, Bld: 155 mg/dL — ABNORMAL HIGH (ref 70–99)
Potassium: 4.5 mmol/L (ref 3.5–5.1)
Sodium: 135 mmol/L (ref 135–145)
Total Bilirubin: 0.5 mg/dL (ref 0.3–1.2)
Total Protein: 6.4 g/dL — ABNORMAL LOW (ref 6.5–8.1)

## 2020-10-24 LAB — PROTIME-INR
INR: 1 (ref 0.8–1.2)
Prothrombin Time: 12.5 seconds (ref 11.4–15.2)

## 2020-10-24 LAB — HEMOGLOBIN A1C
Hgb A1c MFr Bld: 7.1 % — ABNORMAL HIGH (ref 4.8–5.6)
Mean Plasma Glucose: 157.07 mg/dL

## 2020-10-24 LAB — GLUCOSE, CAPILLARY: Glucose-Capillary: 221 mg/dL — ABNORMAL HIGH (ref 70–99)

## 2020-10-24 LAB — SARS CORONAVIRUS 2 (TAT 6-24 HRS): SARS Coronavirus 2: NEGATIVE

## 2020-10-24 LAB — APTT: aPTT: 24 seconds (ref 24–36)

## 2020-10-24 MED ORDER — SODIUM CHLORIDE 0.9 % IV SOLN
1.5000 g | INTRAVENOUS | Status: AC
  Administered 2020-10-27: 1.5 g via INTRAVENOUS
  Filled 2020-10-24: qty 1.5

## 2020-10-24 MED ORDER — PHENYLEPHRINE HCL-NACL 20-0.9 MG/250ML-% IV SOLN
30.0000 ug/min | INTRAVENOUS | Status: AC
  Administered 2020-10-27: 25 ug/min via INTRAVENOUS
  Administered 2020-10-27: 5 ug/min via INTRAVENOUS
  Filled 2020-10-24: qty 250

## 2020-10-24 MED ORDER — MAGNESIUM SULFATE 50 % IJ SOLN
40.0000 meq | INTRAMUSCULAR | Status: DC
  Filled 2020-10-24: qty 9.85

## 2020-10-24 MED ORDER — INSULIN REGULAR(HUMAN) IN NACL 100-0.9 UT/100ML-% IV SOLN
INTRAVENOUS | Status: AC
  Administered 2020-10-27: 5 [IU]/h via INTRAVENOUS
  Filled 2020-10-24: qty 100

## 2020-10-24 MED ORDER — POTASSIUM CHLORIDE 2 MEQ/ML IV SOLN
80.0000 meq | INTRAVENOUS | Status: DC
  Filled 2020-10-24: qty 40

## 2020-10-24 MED ORDER — SODIUM CHLORIDE 0.9 % IV SOLN
750.0000 mg | INTRAVENOUS | Status: AC
  Administered 2020-10-27: 750 mg via INTRAVENOUS
  Filled 2020-10-24: qty 750

## 2020-10-24 MED ORDER — TRANEXAMIC ACID (OHS) PUMP PRIME SOLUTION
2.0000 mg/kg | INTRAVENOUS | Status: DC
  Filled 2020-10-24: qty 1.76

## 2020-10-24 MED ORDER — TRANEXAMIC ACID (OHS) BOLUS VIA INFUSION
15.0000 mg/kg | INTRAVENOUS | Status: AC
  Administered 2020-10-27: 1317 mg via INTRAVENOUS
  Filled 2020-10-24: qty 1317

## 2020-10-24 MED ORDER — PLASMA-LYTE 148 IV SOLN
INTRAVENOUS | Status: DC
  Filled 2020-10-24: qty 2.5

## 2020-10-24 MED ORDER — MILRINONE LACTATE IN DEXTROSE 20-5 MG/100ML-% IV SOLN
0.3000 ug/kg/min | INTRAVENOUS | Status: DC
  Filled 2020-10-24: qty 100

## 2020-10-24 MED ORDER — NITROGLYCERIN IN D5W 200-5 MCG/ML-% IV SOLN
2.0000 ug/min | INTRAVENOUS | Status: AC
  Administered 2020-10-27: 5 ug/min via INTRAVENOUS
  Filled 2020-10-24: qty 250

## 2020-10-24 MED ORDER — DEXMEDETOMIDINE HCL IN NACL 400 MCG/100ML IV SOLN
0.1000 ug/kg/h | INTRAVENOUS | Status: AC
  Administered 2020-10-27: .7 ug/kg/h via INTRAVENOUS
  Filled 2020-10-24: qty 100

## 2020-10-24 MED ORDER — TRANEXAMIC ACID 1000 MG/10ML IV SOLN
1.5000 mg/kg/h | INTRAVENOUS | Status: AC
  Administered 2020-10-27: 1.5 mg/kg/h via INTRAVENOUS
  Filled 2020-10-24: qty 25

## 2020-10-24 MED ORDER — VANCOMYCIN HCL 1500 MG/300ML IV SOLN
1500.0000 mg | INTRAVENOUS | Status: AC
  Administered 2020-10-27: 1500 mg via INTRAVENOUS
  Filled 2020-10-24: qty 300

## 2020-10-24 MED ORDER — NOREPINEPHRINE 4 MG/250ML-% IV SOLN
0.0000 ug/min | INTRAVENOUS | Status: DC
  Filled 2020-10-24: qty 250

## 2020-10-24 MED ORDER — SODIUM CHLORIDE 0.9 % IV SOLN
INTRAVENOUS | Status: DC
  Filled 2020-10-24: qty 30

## 2020-10-24 MED ORDER — EPINEPHRINE HCL 5 MG/250ML IV SOLN IN NS
0.0000 ug/min | INTRAVENOUS | Status: DC
  Filled 2020-10-24: qty 250

## 2020-10-24 NOTE — Progress Notes (Signed)
Pre cabg has been completed.   Preliminary results in CV Proc.   Blanch Media 10/24/2020 10:02 AM

## 2020-10-24 NOTE — Progress Notes (Addendum)
PCP - Dr. Shary Decamp  Cardiologist - Dr.Munley  Chest x-ray - 10/24/20  EKG - 10/02/20  Stress Test - 05/11/18  ECHO - 05/01/18  Cardiac Cath - 10/08/20  Sleep Study - no CPAP - no  LABS-CBC, CMP, PT, PTT, T/S, ABG, A1C, UA, PCR  ASA-81- takes until the day of surgery  ERAS-no  HA1C-"last one was 7.1-  A couple of months ago 10/24/20 Fasting Blood Sugar - doesn't check Checks Blood Sugar _0___ times a day Patient never checks CBG," meter is probably out of date as well as the strips."  Anesthesia- Patient denies chest pain or palpations. Derek Moore has left rib pain -left lower chest laterally, "broke some ribs a few months ago and then I fell in a hole recently and is is muscular pain."  Pt denies having chest pain, sob, or fever at this time. All instructions explained to the pt, with a verbal understanding of the material. Pt agrees to go over the instructions while at home for a better understanding. Pt also instructed to self quarantine after being tested for COVID-19. The opportunity to ask questions was provided.

## 2020-10-27 ENCOUNTER — Other Ambulatory Visit: Payer: Self-pay

## 2020-10-27 ENCOUNTER — Inpatient Hospital Stay (HOSPITAL_COMMUNITY): Payer: Commercial Managed Care - PPO

## 2020-10-27 ENCOUNTER — Inpatient Hospital Stay (HOSPITAL_COMMUNITY)
Admission: RE | Disposition: A | Discharge status: Home / Self Care | Payer: Self-pay | Source: Home / Self Care | Attending: Surgery

## 2020-10-27 ENCOUNTER — Encounter (HOSPITAL_COMMUNITY): Payer: Self-pay | Admitting: Surgery

## 2020-10-27 ENCOUNTER — Inpatient Hospital Stay (HOSPITAL_COMMUNITY): Payer: Commercial Managed Care - PPO | Admitting: Certified Registered Nurse Anesthetist

## 2020-10-27 ENCOUNTER — Inpatient Hospital Stay (HOSPITAL_COMMUNITY)
Admission: RE | Admit: 2020-10-27 | Discharge: 2020-10-31 | DRG: 236 | Disposition: A | Discharge status: Home / Self Care | Payer: Commercial Managed Care - PPO | Attending: Surgery | Admitting: Surgery

## 2020-10-27 DIAGNOSIS — F32A Depression, unspecified: Secondary | ICD-10-CM | POA: Diagnosis present

## 2020-10-27 DIAGNOSIS — D696 Thrombocytopenia, unspecified: Secondary | ICD-10-CM | POA: Diagnosis not present

## 2020-10-27 DIAGNOSIS — I97190 Other postprocedural cardiac functional disturbances following cardiac surgery: Secondary | ICD-10-CM | POA: Diagnosis not present

## 2020-10-27 DIAGNOSIS — I4891 Unspecified atrial fibrillation: Secondary | ICD-10-CM | POA: Diagnosis not present

## 2020-10-27 DIAGNOSIS — R001 Bradycardia, unspecified: Secondary | ICD-10-CM | POA: Diagnosis present

## 2020-10-27 DIAGNOSIS — E119 Type 2 diabetes mellitus without complications: Secondary | ICD-10-CM | POA: Diagnosis not present

## 2020-10-27 DIAGNOSIS — Z7982 Long term (current) use of aspirin: Secondary | ICD-10-CM | POA: Diagnosis not present

## 2020-10-27 DIAGNOSIS — E785 Hyperlipidemia, unspecified: Secondary | ICD-10-CM | POA: Diagnosis present

## 2020-10-27 DIAGNOSIS — K219 Gastro-esophageal reflux disease without esophagitis: Secondary | ICD-10-CM | POA: Diagnosis present

## 2020-10-27 DIAGNOSIS — F17220 Nicotine dependence, chewing tobacco, uncomplicated: Secondary | ICD-10-CM | POA: Diagnosis present

## 2020-10-27 DIAGNOSIS — D62 Acute posthemorrhagic anemia: Secondary | ICD-10-CM | POA: Diagnosis not present

## 2020-10-27 DIAGNOSIS — Z20822 Contact with and (suspected) exposure to covid-19: Secondary | ICD-10-CM | POA: Diagnosis present

## 2020-10-27 DIAGNOSIS — E039 Hypothyroidism, unspecified: Secondary | ICD-10-CM | POA: Diagnosis not present

## 2020-10-27 DIAGNOSIS — M069 Rheumatoid arthritis, unspecified: Secondary | ICD-10-CM | POA: Diagnosis present

## 2020-10-27 DIAGNOSIS — J939 Pneumothorax, unspecified: Secondary | ICD-10-CM | POA: Diagnosis not present

## 2020-10-27 DIAGNOSIS — I700 Atherosclerosis of aorta: Secondary | ICD-10-CM | POA: Diagnosis not present

## 2020-10-27 DIAGNOSIS — E782 Mixed hyperlipidemia: Secondary | ICD-10-CM | POA: Diagnosis present

## 2020-10-27 DIAGNOSIS — Z951 Presence of aortocoronary bypass graft: Secondary | ICD-10-CM

## 2020-10-27 DIAGNOSIS — Z7984 Long term (current) use of oral hypoglycemic drugs: Secondary | ICD-10-CM

## 2020-10-27 DIAGNOSIS — G7000 Myasthenia gravis without (acute) exacerbation: Secondary | ICD-10-CM | POA: Diagnosis present

## 2020-10-27 DIAGNOSIS — M1A9XX0 Chronic gout, unspecified, without tophus (tophi): Secondary | ICD-10-CM | POA: Diagnosis present

## 2020-10-27 DIAGNOSIS — Z79890 Hormone replacement therapy (postmenopausal): Secondary | ICD-10-CM

## 2020-10-27 DIAGNOSIS — Z79899 Other long term (current) drug therapy: Secondary | ICD-10-CM | POA: Diagnosis not present

## 2020-10-27 DIAGNOSIS — I2510 Atherosclerotic heart disease of native coronary artery without angina pectoris: Principal | ICD-10-CM | POA: Diagnosis present

## 2020-10-27 HISTORY — PX: TEE WITHOUT CARDIOVERSION: SHX5443

## 2020-10-27 HISTORY — PX: CORONARY ARTERY BYPASS GRAFT: SHX141

## 2020-10-27 HISTORY — DX: Presence of aortocoronary bypass graft: Z95.1

## 2020-10-27 LAB — POCT I-STAT, CHEM 8
BUN: 14 mg/dL (ref 8–23)
BUN: 13 mg/dL (ref 8–23)
BUN: 12 mg/dL (ref 8–23)
BUN: 12 mg/dL (ref 8–23)
BUN: 12 mg/dL (ref 8–23)
Calcium, Ion: 1.28 mmol/L (ref 1.15–1.40)
Calcium, Ion: 1.29 mmol/L (ref 1.15–1.40)
Calcium, Ion: 1.04 mmol/L — ABNORMAL LOW (ref 1.15–1.40)
Calcium, Ion: 1.01 mmol/L — ABNORMAL LOW (ref 1.15–1.40)
Calcium, Ion: 1.05 mmol/L — ABNORMAL LOW (ref 1.15–1.40)
Chloride: 105 mmol/L (ref 98–111)
Chloride: 104 mmol/L (ref 98–111)
Chloride: 101 mmol/L (ref 98–111)
Chloride: 102 mmol/L (ref 98–111)
Chloride: 103 mmol/L (ref 98–111)
Creatinine, Ser: 0.8 mg/dL (ref 0.61–1.24)
Creatinine, Ser: 0.8 mg/dL (ref 0.61–1.24)
Creatinine, Ser: 0.6 mg/dL — ABNORMAL LOW (ref 0.61–1.24)
Creatinine, Ser: 0.7 mg/dL (ref 0.61–1.24)
Creatinine, Ser: 0.7 mg/dL (ref 0.61–1.24)
Glucose, Bld: 165 mg/dL — ABNORMAL HIGH (ref 70–99)
Glucose, Bld: 121 mg/dL — ABNORMAL HIGH (ref 70–99)
Glucose, Bld: 98 mg/dL (ref 70–99)
Glucose, Bld: 124 mg/dL — ABNORMAL HIGH (ref 70–99)
Glucose, Bld: 125 mg/dL — ABNORMAL HIGH (ref 70–99)
HCT: 41 % (ref 39.0–52.0)
HCT: 40 % (ref 39.0–52.0)
HCT: 31 % — ABNORMAL LOW (ref 39.0–52.0)
HCT: 27 % — ABNORMAL LOW (ref 39.0–52.0)
HCT: 30 % — ABNORMAL LOW (ref 39.0–52.0)
Hemoglobin: 13.9 g/dL (ref 13.0–17.0)
Hemoglobin: 13.6 g/dL (ref 13.0–17.0)
Hemoglobin: 10.5 g/dL — ABNORMAL LOW (ref 13.0–17.0)
Hemoglobin: 10.2 g/dL — ABNORMAL LOW (ref 13.0–17.0)
Hemoglobin: 9.2 g/dL — ABNORMAL LOW (ref 13.0–17.0)
Potassium: 4.1 mmol/L (ref 3.5–5.1)
Potassium: 4.1 mmol/L (ref 3.5–5.1)
Potassium: 4.1 mmol/L (ref 3.5–5.1)
Potassium: 4 mmol/L (ref 3.5–5.1)
Potassium: 4.6 mmol/L (ref 3.5–5.1)
Sodium: 139 mmol/L (ref 135–145)
Sodium: 141 mmol/L (ref 135–145)
Sodium: 136 mmol/L (ref 135–145)
Sodium: 138 mmol/L (ref 135–145)
Sodium: 140 mmol/L (ref 135–145)
TCO2: 24 mmol/L (ref 22–32)
TCO2: 24 mmol/L (ref 22–32)
TCO2: 24 mmol/L (ref 22–32)
TCO2: 24 mmol/L (ref 22–32)
TCO2: 25 mmol/L (ref 22–32)

## 2020-10-27 LAB — POCT I-STAT 7, (LYTES, BLD GAS, ICA,H+H)
Acid-Base Excess: 3 mmol/L — ABNORMAL HIGH (ref 0.0–2.0)
Acid-Base Excess: 1 mmol/L (ref 0.0–2.0)
Acid-Base Excess: 2 mmol/L (ref 0.0–2.0)
Acid-Base Excess: 0 mmol/L (ref 0.0–2.0)
Acid-base deficit: 2 mmol/L (ref 0.0–2.0)
Acid-base deficit: 2 mmol/L (ref 0.0–2.0)
Acid-base deficit: 2 mmol/L (ref 0.0–2.0)
Bicarbonate: 28.9 mmol/L — ABNORMAL HIGH (ref 20.0–28.0)
Bicarbonate: 25.9 mmol/L (ref 20.0–28.0)
Bicarbonate: 26 mmol/L (ref 20.0–28.0)
Bicarbonate: 25 mmol/L (ref 20.0–28.0)
Bicarbonate: 24.5 mmol/L (ref 20.0–28.0)
Bicarbonate: 26.8 mmol/L (ref 20.0–28.0)
Bicarbonate: 25.7 mmol/L (ref 20.0–28.0)
Calcium, Ion: 1.08 mmol/L — ABNORMAL LOW (ref 1.15–1.40)
Calcium, Ion: 0.98 mmol/L — ABNORMAL LOW (ref 1.15–1.40)
Calcium, Ion: 1.05 mmol/L — ABNORMAL LOW (ref 1.15–1.40)
Calcium, Ion: 0.99 mmol/L — ABNORMAL LOW (ref 1.15–1.40)
Calcium, Ion: 1.17 mmol/L (ref 1.15–1.40)
Calcium, Ion: 1.23 mmol/L (ref 1.15–1.40)
Calcium, Ion: 1.2 mmol/L (ref 1.15–1.40)
HCT: 31 % — ABNORMAL LOW (ref 39.0–52.0)
HCT: 28 % — ABNORMAL LOW (ref 39.0–52.0)
HCT: 29 % — ABNORMAL LOW (ref 39.0–52.0)
HCT: 31 % — ABNORMAL LOW (ref 39.0–52.0)
HCT: 30 % — ABNORMAL LOW (ref 39.0–52.0)
HCT: 31 % — ABNORMAL LOW (ref 39.0–52.0)
HCT: 30 % — ABNORMAL LOW (ref 39.0–52.0)
Hemoglobin: 10.5 g/dL — ABNORMAL LOW (ref 13.0–17.0)
Hemoglobin: 9.5 g/dL — ABNORMAL LOW (ref 13.0–17.0)
Hemoglobin: 9.9 g/dL — ABNORMAL LOW (ref 13.0–17.0)
Hemoglobin: 10.5 g/dL — ABNORMAL LOW (ref 13.0–17.0)
Hemoglobin: 10.2 g/dL — ABNORMAL LOW (ref 13.0–17.0)
Hemoglobin: 10.5 g/dL — ABNORMAL LOW (ref 13.0–17.0)
Hemoglobin: 10.2 g/dL — ABNORMAL LOW (ref 13.0–17.0)
O2 Saturation: 100 %
O2 Saturation: 100 %
O2 Saturation: 100 %
O2 Saturation: 99 %
O2 Saturation: 99 %
O2 Saturation: 92 %
O2 Saturation: 100 %
Patient temperature: 35.7
Patient temperature: 37
Patient temperature: 38
Patient temperature: 38.3
Potassium: 4.4 mmol/L (ref 3.5–5.1)
Potassium: 4 mmol/L (ref 3.5–5.1)
Potassium: 4.5 mmol/L (ref 3.5–5.1)
Potassium: 3.9 mmol/L (ref 3.5–5.1)
Potassium: 4.4 mmol/L (ref 3.5–5.1)
Potassium: 4.6 mmol/L (ref 3.5–5.1)
Potassium: 4.2 mmol/L (ref 3.5–5.1)
Sodium: 140 mmol/L (ref 135–145)
Sodium: 138 mmol/L (ref 135–145)
Sodium: 140 mmol/L (ref 135–145)
Sodium: 144 mmol/L (ref 135–145)
Sodium: 141 mmol/L (ref 135–145)
Sodium: 143 mmol/L (ref 135–145)
Sodium: 141 mmol/L (ref 135–145)
TCO2: 30 mmol/L (ref 22–32)
TCO2: 27 mmol/L (ref 22–32)
TCO2: 27 mmol/L (ref 22–32)
TCO2: 27 mmol/L (ref 22–32)
TCO2: 26 mmol/L (ref 22–32)
TCO2: 29 mmol/L (ref 22–32)
TCO2: 27 mmol/L (ref 22–32)
pCO2 arterial: 52.8 mmHg — ABNORMAL HIGH (ref 32.0–48.0)
pCO2 arterial: 35.7 mmHg (ref 32.0–48.0)
pCO2 arterial: 41.1 mmHg (ref 32.0–48.0)
pCO2 arterial: 50.6 mmHg — ABNORMAL HIGH (ref 32.0–48.0)
pCO2 arterial: 50.5 mmHg — ABNORMAL HIGH (ref 32.0–48.0)
pCO2 arterial: 72.6 mmHg (ref 32.0–48.0)
pCO2 arterial: 47 mmHg (ref 32.0–48.0)
pH, Arterial: 7.346 — ABNORMAL LOW (ref 7.350–7.450)
pH, Arterial: 7.408 (ref 7.350–7.450)
pH, Arterial: 7.471 — ABNORMAL HIGH (ref 7.350–7.450)
pH, Arterial: 7.294 — ABNORMAL LOW (ref 7.350–7.450)
pH, Arterial: 7.293 — ABNORMAL LOW (ref 7.350–7.450)
pH, Arterial: 7.182 — CL (ref 7.350–7.450)
pH, Arterial: 7.352 (ref 7.350–7.450)
pO2, Arterial: 374 mmHg — ABNORMAL HIGH (ref 83.0–108.0)
pO2, Arterial: 226 mmHg — ABNORMAL HIGH (ref 83.0–108.0)
pO2, Arterial: 273 mmHg — ABNORMAL HIGH (ref 83.0–108.0)
pO2, Arterial: 142 mmHg — ABNORMAL HIGH (ref 83.0–108.0)
pO2, Arterial: 141 mmHg — ABNORMAL HIGH (ref 83.0–108.0)
pO2, Arterial: 86 mmHg (ref 83.0–108.0)
pO2, Arterial: 191 mmHg — ABNORMAL HIGH (ref 83.0–108.0)

## 2020-10-27 LAB — CBC
HCT: 30.5 % — ABNORMAL LOW (ref 39.0–52.0)
HCT: 32.2 % — ABNORMAL LOW (ref 39.0–52.0)
Hemoglobin: 10 g/dL — ABNORMAL LOW (ref 13.0–17.0)
Hemoglobin: 10.7 g/dL — ABNORMAL LOW (ref 13.0–17.0)
MCH: 31 pg (ref 26.0–34.0)
MCH: 31.1 pg (ref 26.0–34.0)
MCHC: 32.8 g/dL (ref 30.0–36.0)
MCHC: 33.2 g/dL (ref 30.0–36.0)
MCV: 94.4 fL (ref 80.0–100.0)
MCV: 93.6 fL (ref 80.0–100.0)
Platelets: 103 10*3/uL — ABNORMAL LOW (ref 150–400)
Platelets: 106 10*3/uL — ABNORMAL LOW (ref 150–400)
RBC: 3.23 MIL/uL — ABNORMAL LOW (ref 4.22–5.81)
RBC: 3.44 MIL/uL — ABNORMAL LOW (ref 4.22–5.81)
RDW: 13.4 % (ref 11.5–15.5)
RDW: 13.6 % (ref 11.5–15.5)
WBC: 10.4 10*3/uL (ref 4.0–10.5)
WBC: 9.1 10*3/uL (ref 4.0–10.5)
nRBC: 0 % (ref 0.0–0.2)
nRBC: 0 % (ref 0.0–0.2)

## 2020-10-27 LAB — POCT I-STAT EG7
Acid-Base Excess: 2 mmol/L (ref 0.0–2.0)
Bicarbonate: 28.1 mmol/L — ABNORMAL HIGH (ref 20.0–28.0)
Calcium, Ion: 1.13 mmol/L — ABNORMAL LOW (ref 1.15–1.40)
HCT: 33 % — ABNORMAL LOW (ref 39.0–52.0)
Hemoglobin: 11.2 g/dL — ABNORMAL LOW (ref 13.0–17.0)
O2 Saturation: 85 %
Potassium: 4 mmol/L (ref 3.5–5.1)
Sodium: 141 mmol/L (ref 135–145)
TCO2: 30 mmol/L (ref 22–32)
pCO2, Ven: 50.6 mmHg (ref 44.0–60.0)
pH, Ven: 7.353 (ref 7.250–7.430)
pO2, Ven: 53 mmHg — ABNORMAL HIGH (ref 32.0–45.0)

## 2020-10-27 LAB — PLATELET COUNT: Platelets: 90 10*3/uL — ABNORMAL LOW (ref 150–400)

## 2020-10-27 LAB — ABO/RH: ABO/RH(D): AB POS

## 2020-10-27 LAB — GLUCOSE, CAPILLARY
Glucose-Capillary: 178 mg/dL — ABNORMAL HIGH (ref 70–99)
Glucose-Capillary: 117 mg/dL — ABNORMAL HIGH (ref 70–99)
Glucose-Capillary: 131 mg/dL — ABNORMAL HIGH (ref 70–99)
Glucose-Capillary: 112 mg/dL — ABNORMAL HIGH (ref 70–99)
Glucose-Capillary: 132 mg/dL — ABNORMAL HIGH (ref 70–99)

## 2020-10-27 LAB — BASIC METABOLIC PANEL
Anion gap: 6 (ref 5–15)
BUN: 10 mg/dL (ref 8–23)
CO2: 24 mmol/L (ref 22–32)
Calcium: 8 mg/dL — ABNORMAL LOW (ref 8.9–10.3)
Chloride: 109 mmol/L (ref 98–111)
Creatinine, Ser: 0.94 mg/dL (ref 0.61–1.24)
GFR, Estimated: >60 mL/min (ref >60)
Glucose, Bld: 133 mg/dL — ABNORMAL HIGH (ref 70–99)
Potassium: 4.3 mmol/L (ref 3.5–5.1)
Sodium: 139 mmol/L (ref 135–145)

## 2020-10-27 LAB — HEMOGLOBIN AND HEMATOCRIT, BLOOD
HCT: 31.4 % — ABNORMAL LOW (ref 39.0–52.0)
Hemoglobin: 10.6 g/dL — ABNORMAL LOW (ref 13.0–17.0)

## 2020-10-27 LAB — APTT: aPTT: 32 seconds (ref 24–36)

## 2020-10-27 LAB — PROTIME-INR
INR: 1.4 — ABNORMAL HIGH (ref 0.8–1.2)
Prothrombin Time: 16.2 seconds — ABNORMAL HIGH (ref 11.4–15.2)

## 2020-10-27 LAB — ECHO INTRAOPERATIVE TEE
Height: 66 in
Weight: 3056 oz

## 2020-10-27 LAB — MAGNESIUM: Magnesium: 3 mg/dL — ABNORMAL HIGH (ref 1.7–2.4)

## 2020-10-27 SURGERY — CORONARY ARTERY BYPASS GRAFTING (CABG)
Anesthesia: General | Site: Chest

## 2020-10-27 MED ORDER — MIDAZOLAM HCL 2 MG/2ML IJ SOLN: 2.0000 mg | INTRAMUSCULAR | Status: DC | PRN

## 2020-10-27 MED ORDER — CHLORHEXIDINE GLUCONATE CLOTH 2 % EX PADS
6.0000 | MEDICATED_PAD | Freq: Every day | CUTANEOUS | Status: DC
  Administered 2020-10-28 – 2020-10-29 (×2): 6 via TOPICAL

## 2020-10-27 MED ORDER — MIDAZOLAM HCL 5 MG/5ML IJ SOLN
INTRAMUSCULAR | Status: DC | PRN
  Administered 2020-10-27 (×4): 1 mg via INTRAVENOUS
  Administered 2020-10-27: 3 mg via INTRAVENOUS
  Administered 2020-10-27: 1 mg via INTRAVENOUS
  Administered 2020-10-27: 2 mg via INTRAVENOUS

## 2020-10-27 MED ORDER — LACTATED RINGERS IV SOLN: 500.0000 mL | Freq: Once | INTRAVENOUS | Status: DC | PRN

## 2020-10-27 MED ORDER — MIDAZOLAM HCL (PF) 10 MG/2ML IJ SOLN
INTRAMUSCULAR | Status: AC
  Filled 2020-10-27: qty 2

## 2020-10-27 MED ORDER — EPHEDRINE SULFATE-NACL 50-0.9 MG/10ML-% IV SOSY
PREFILLED_SYRINGE | INTRAVENOUS | Status: DC | PRN
  Administered 2020-10-27: 5 mg via INTRAVENOUS
  Administered 2020-10-27: 10 mg via INTRAVENOUS
  Administered 2020-10-27: 5 mg via INTRAVENOUS

## 2020-10-27 MED ORDER — AMIODARONE HCL IN DEXTROSE 360-4.14 MG/200ML-% IV SOLN
60.0000 mg/h | INTRAVENOUS | Status: AC
  Administered 2020-10-27 (×2): 60 mg/h via INTRAVENOUS
  Filled 2020-10-27 (×2): qty 200

## 2020-10-27 MED ORDER — METOPROLOL TARTRATE 5 MG/5ML IV SOLN: 2.5000 mg | INTRAVENOUS | Status: DC | PRN

## 2020-10-27 MED ORDER — FENTANYL CITRATE (PF) 250 MCG/5ML IJ SOLN
INTRAMUSCULAR | Status: AC
  Filled 2020-10-27: qty 25

## 2020-10-27 MED ORDER — METOPROLOL TARTRATE 12.5 MG HALF TABLET
12.5000 mg | ORAL_TABLET | Freq: Once | ORAL | Status: DC
  Filled 2020-10-27: qty 1

## 2020-10-27 MED ORDER — PROPOFOL 10 MG/ML IV BOLUS
INTRAVENOUS | Status: DC | PRN
  Administered 2020-10-27: 20 mg via INTRAVENOUS
  Administered 2020-10-27: 70 mg via INTRAVENOUS
  Administered 2020-10-27: 20 mg via INTRAVENOUS

## 2020-10-27 MED ORDER — SODIUM CHLORIDE 0.9 % IV SOLN: INTRAVENOUS | Status: DC

## 2020-10-27 MED ORDER — CHLORHEXIDINE GLUCONATE 0.12 % MT SOLN
15.0000 mL | OROMUCOSAL | Status: AC
  Administered 2020-10-27: 15 mL via OROMUCOSAL

## 2020-10-27 MED ORDER — METOPROLOL TARTRATE 12.5 MG HALF TABLET: 12.5000 mg | ORAL_TABLET | Freq: Two times a day (BID) | ORAL | Status: DC

## 2020-10-27 MED ORDER — FENTANYL CITRATE (PF) 250 MCG/5ML IJ SOLN
INTRAMUSCULAR | Status: DC | PRN
  Administered 2020-10-27: 50 ug via INTRAVENOUS
  Administered 2020-10-27 (×2): 100 ug via INTRAVENOUS
  Administered 2020-10-27: 150 ug via INTRAVENOUS
  Administered 2020-10-27: 200 ug via INTRAVENOUS
  Administered 2020-10-27 (×3): 100 ug via INTRAVENOUS
  Administered 2020-10-27: 200 ug via INTRAVENOUS
  Administered 2020-10-27: 100 ug via INTRAVENOUS
  Administered 2020-10-27: 50 ug via INTRAVENOUS

## 2020-10-27 MED ORDER — SUGAMMADEX SODIUM 200 MG/2ML IV SOLN
INTRAVENOUS | Status: DC | PRN
  Administered 2020-10-27: 200 mg via INTRAVENOUS

## 2020-10-27 MED ORDER — SODIUM CHLORIDE 0.45 % IV SOLN: INTRAVENOUS | Status: DC | PRN

## 2020-10-27 MED ORDER — DEXTROSE 50 % IV SOLN: 0.0000 mL | INTRAVENOUS | Status: DC | PRN

## 2020-10-27 MED ORDER — AMIODARONE HCL IN DEXTROSE 360-4.14 MG/200ML-% IV SOLN
30.0000 mg/h | INTRAVENOUS | Status: DC
  Administered 2020-10-28 – 2020-10-29 (×3): 30 mg/h via INTRAVENOUS
  Filled 2020-10-27 (×3): qty 200

## 2020-10-27 MED ORDER — SODIUM CHLORIDE 0.9 % IV SOLN
1.5000 g | Freq: Two times a day (BID) | INTRAVENOUS | Status: AC
  Administered 2020-10-27 – 2020-10-29 (×4): 1.5 g via INTRAVENOUS
  Filled 2020-10-27 (×4): qty 1.5

## 2020-10-27 MED ORDER — CHLORHEXIDINE GLUCONATE 0.12 % MT SOLN
15.0000 mL | Freq: Once | OROMUCOSAL | Status: AC
  Administered 2020-10-27 – 2020-10-28 (×2): 15 mL via OROMUCOSAL
  Filled 2020-10-27: qty 15

## 2020-10-27 MED ORDER — HEPARIN SODIUM (PORCINE) 1000 UNIT/ML IJ SOLN
INTRAMUSCULAR | Status: DC | PRN
  Administered 2020-10-27: 27000 [IU] via INTRAVENOUS

## 2020-10-27 MED ORDER — SODIUM CHLORIDE 0.9 % IV SOLN: 250.0000 mL | INTRAVENOUS | Status: DC

## 2020-10-27 MED ORDER — SODIUM CHLORIDE 0.9% FLUSH
3.0000 mL | Freq: Two times a day (BID) | INTRAVENOUS | Status: DC
  Administered 2020-10-28 – 2020-10-31 (×2): 3 mL via INTRAVENOUS

## 2020-10-27 MED ORDER — METOPROLOL TARTRATE 25 MG/10 ML ORAL SUSPENSION: 12.5000 mg | Freq: Two times a day (BID) | ORAL | Status: DC

## 2020-10-27 MED ORDER — PROTAMINE SULFATE 10 MG/ML IV SOLN
INTRAVENOUS | Status: DC | PRN
  Administered 2020-10-27: 250 mg via INTRAVENOUS

## 2020-10-27 MED ORDER — SODIUM CHLORIDE 0.9% FLUSH: 3.0000 mL | INTRAVENOUS | Status: DC | PRN

## 2020-10-27 MED ORDER — BISACODYL 10 MG RE SUPP: 10.0000 mg | Freq: Every day | RECTAL | Status: DC

## 2020-10-27 MED ORDER — INSULIN REGULAR(HUMAN) IN NACL 100-0.9 UT/100ML-% IV SOLN: INTRAVENOUS | Status: DC

## 2020-10-27 MED ORDER — MORPHINE SULFATE (PF) 2 MG/ML IV SOLN
1.0000 mg | INTRAVENOUS | Status: DC | PRN
  Administered 2020-10-27 – 2020-10-28 (×7): 2 mg via INTRAVENOUS
  Filled 2020-10-27 (×7): qty 1

## 2020-10-27 MED ORDER — MAGNESIUM SULFATE 4 GM/100ML IV SOLN
4.0000 g | Freq: Once | INTRAVENOUS | Status: AC
  Administered 2020-10-27: 4 g via INTRAVENOUS
  Filled 2020-10-27: qty 100

## 2020-10-27 MED ORDER — AMIODARONE LOAD VIA INFUSION
150.0000 mg | Freq: Once | INTRAVENOUS | Status: AC
  Administered 2020-10-27: 150 mg via INTRAVENOUS
  Filled 2020-10-27: qty 83.34

## 2020-10-27 MED ORDER — SODIUM CHLORIDE 0.9% IV SOLUTION: Freq: Once | INTRAVENOUS | Status: DC

## 2020-10-27 MED ORDER — BISACODYL 5 MG PO TBEC
10.0000 mg | DELAYED_RELEASE_TABLET | Freq: Every day | ORAL | Status: DC
  Administered 2020-10-28: 10 mg via ORAL
  Filled 2020-10-27 (×2): qty 2

## 2020-10-27 MED ORDER — LACTATED RINGERS IV SOLN: INTRAVENOUS | Status: DC | PRN

## 2020-10-27 MED ORDER — 0.9 % SODIUM CHLORIDE (POUR BTL) OPTIME
TOPICAL | Status: DC | PRN
  Administered 2020-10-27: 5000 mL

## 2020-10-27 MED ORDER — PHENYLEPHRINE HCL-NACL 20-0.9 MG/250ML-% IV SOLN
0.0000 ug/min | INTRAVENOUS | Status: DC
  Administered 2020-10-27: 45 ug/min via INTRAVENOUS
  Administered 2020-10-27: 15 ug/min via INTRAVENOUS
  Administered 2020-10-28: 35 ug/min via INTRAVENOUS
  Administered 2020-10-28: 60 ug/min via INTRAVENOUS
  Filled 2020-10-27 (×3): qty 250

## 2020-10-27 MED ORDER — NITROGLYCERIN IN D5W 200-5 MCG/ML-% IV SOLN: 0.0000 ug/min | INTRAVENOUS | Status: DC

## 2020-10-27 MED ORDER — SODIUM BICARBONATE 8.4 % IV SOLN
50.0000 meq | Freq: Once | INTRAVENOUS | Status: AC
  Administered 2020-10-27: 50 meq via INTRAVENOUS

## 2020-10-27 MED ORDER — EPHEDRINE 5 MG/ML INJ
INTRAVENOUS | Status: AC
  Filled 2020-10-27: qty 10

## 2020-10-27 MED ORDER — HEPARIN SODIUM (PORCINE) 1000 UNIT/ML IJ SOLN
INTRAMUSCULAR | Status: AC
  Filled 2020-10-27: qty 1

## 2020-10-27 MED ORDER — ROCURONIUM BROMIDE 10 MG/ML (PF) SYRINGE
PREFILLED_SYRINGE | INTRAVENOUS | Status: DC | PRN
  Administered 2020-10-27: 50 mg via INTRAVENOUS
  Administered 2020-10-27: 60 mg via INTRAVENOUS
  Administered 2020-10-27: 50 mg via INTRAVENOUS
  Administered 2020-10-27: 40 mg via INTRAVENOUS

## 2020-10-27 MED ORDER — DEXMEDETOMIDINE HCL IN NACL 400 MCG/100ML IV SOLN
0.0000 ug/kg/h | INTRAVENOUS | Status: DC
  Administered 2020-10-27: 0.2 ug/kg/h via INTRAVENOUS
  Filled 2020-10-27: qty 100

## 2020-10-27 MED ORDER — ASPIRIN EC 325 MG PO TBEC
325.0000 mg | DELAYED_RELEASE_TABLET | Freq: Every day | ORAL | Status: DC
  Administered 2020-10-28 – 2020-10-29 (×2): 325 mg via ORAL
  Filled 2020-10-27 (×2): qty 1

## 2020-10-27 MED ORDER — PHENYLEPHRINE 40 MCG/ML (10ML) SYRINGE FOR IV PUSH (FOR BLOOD PRESSURE SUPPORT)
PREFILLED_SYRINGE | INTRAVENOUS | Status: DC | PRN
  Administered 2020-10-27 (×6): 40 ug via INTRAVENOUS

## 2020-10-27 MED ORDER — LACTATED RINGERS IV SOLN: INTRAVENOUS | Status: DC

## 2020-10-27 MED ORDER — PROTAMINE SULFATE 10 MG/ML IV SOLN
INTRAVENOUS | Status: AC
  Filled 2020-10-27: qty 25

## 2020-10-27 MED ORDER — DOCUSATE SODIUM 100 MG PO CAPS
200.0000 mg | ORAL_CAPSULE | Freq: Every day | ORAL | Status: DC
  Administered 2020-10-28: 200 mg via ORAL
  Filled 2020-10-27 (×2): qty 2

## 2020-10-27 MED ORDER — TRAMADOL HCL 50 MG PO TABS
50.0000 mg | ORAL_TABLET | ORAL | Status: DC | PRN
  Administered 2020-10-28 – 2020-10-29 (×5): 100 mg via ORAL
  Filled 2020-10-27 (×5): qty 2

## 2020-10-27 MED ORDER — ACETAMINOPHEN 650 MG RE SUPP
650.0000 mg | Freq: Once | RECTAL | Status: AC
  Administered 2020-10-27: 650 mg via RECTAL

## 2020-10-27 MED ORDER — POTASSIUM CHLORIDE 10 MEQ/50ML IV SOLN
10.0000 meq | INTRAVENOUS | Status: AC
  Administered 2020-10-27 (×3): 10 meq via INTRAVENOUS

## 2020-10-27 MED ORDER — PROPOFOL 10 MG/ML IV BOLUS
INTRAVENOUS | Status: AC
  Filled 2020-10-27: qty 20

## 2020-10-27 MED ORDER — ACETAMINOPHEN 160 MG/5ML PO SOLN
1000.0000 mg | Freq: Four times a day (QID) | ORAL | Status: DC
  Administered 2020-10-27: 1000 mg
  Filled 2020-10-27: qty 40.6

## 2020-10-27 MED ORDER — ROCURONIUM BROMIDE 10 MG/ML (PF) SYRINGE
PREFILLED_SYRINGE | INTRAVENOUS | Status: AC
  Filled 2020-10-27: qty 10

## 2020-10-27 MED ORDER — FAMOTIDINE IN NACL 20-0.9 MG/50ML-% IV SOLN
20.0000 mg | Freq: Two times a day (BID) | INTRAVENOUS | Status: AC
  Administered 2020-10-27: 20 mg via INTRAVENOUS
  Filled 2020-10-27: qty 50

## 2020-10-27 MED ORDER — ACETAMINOPHEN 500 MG PO TABS
1000.0000 mg | ORAL_TABLET | Freq: Four times a day (QID) | ORAL | Status: DC
  Administered 2020-10-28 – 2020-10-31 (×12): 1000 mg via ORAL
  Filled 2020-10-27 (×12): qty 2

## 2020-10-27 MED ORDER — PLASMA-LYTE 148 IV SOLN
INTRAVENOUS | Status: DC | PRN
  Administered 2020-10-27: 500 mL

## 2020-10-27 MED ORDER — PANTOPRAZOLE SODIUM 40 MG PO TBEC
40.0000 mg | DELAYED_RELEASE_TABLET | Freq: Every day | ORAL | Status: DC
  Administered 2020-10-29: 40 mg via ORAL
  Filled 2020-10-27: qty 1

## 2020-10-27 MED ORDER — PHENYLEPHRINE HCL (PRESSORS) 10 MG/ML IV SOLN
INTRAVENOUS | Status: AC
  Filled 2020-10-27: qty 1

## 2020-10-27 MED ORDER — NITROGLYCERIN 0.2 MG/ML ON CALL CATH LAB
INTRAVENOUS | Status: DC | PRN
  Administered 2020-10-27 (×3): 20 ug via INTRAVENOUS

## 2020-10-27 MED ORDER — THROMBIN 20000 UNITS EX SOLR
OROMUCOSAL | Status: DC | PRN
  Administered 2020-10-27: 12 mL via TOPICAL

## 2020-10-27 MED ORDER — THROMBIN (RECOMBINANT) 20000 UNITS EX SOLR
CUTANEOUS | Status: AC
  Filled 2020-10-27: qty 20000

## 2020-10-27 MED ORDER — ALBUMIN HUMAN 5 % IV SOLN
250.0000 mL | INTRAVENOUS | Status: DC | PRN
  Administered 2020-10-27 – 2020-10-28 (×2): 12.5 g via INTRAVENOUS

## 2020-10-27 MED ORDER — PROTAMINE SULFATE 10 MG/ML IV SOLN
INTRAVENOUS | Status: AC
  Filled 2020-10-27: qty 5

## 2020-10-27 MED ORDER — ONDANSETRON HCL 4 MG/2ML IJ SOLN: 4.0000 mg | Freq: Four times a day (QID) | INTRAMUSCULAR | Status: DC | PRN

## 2020-10-27 MED ORDER — ORAL CARE MOUTH RINSE
15.0000 mL | OROMUCOSAL | Status: DC
  Administered 2020-10-27 – 2020-10-28 (×5): 15 mL via OROMUCOSAL

## 2020-10-27 MED ORDER — CHLORHEXIDINE GLUCONATE 0.12% ORAL RINSE (MEDLINE KIT)
15.0000 mL | Freq: Two times a day (BID) | OROMUCOSAL | Status: DC
  Administered 2020-10-27: 15 mL via OROMUCOSAL

## 2020-10-27 MED ORDER — SODIUM CHLORIDE 0.9 % IV SOLN: INTRAVENOUS | Status: DC | PRN

## 2020-10-27 MED ORDER — LEVOTHYROXINE SODIUM 25 MCG PO TABS
125.0000 ug | ORAL_TABLET | Freq: Every day | ORAL | Status: DC
  Administered 2020-10-28 – 2020-10-31 (×4): 125 ug via ORAL
  Filled 2020-10-27 (×5): qty 1

## 2020-10-27 MED ORDER — PHENYLEPHRINE 40 MCG/ML (10ML) SYRINGE FOR IV PUSH (FOR BLOOD PRESSURE SUPPORT)
PREFILLED_SYRINGE | INTRAVENOUS | Status: AC
  Filled 2020-10-27: qty 10

## 2020-10-27 MED ORDER — ACETAMINOPHEN 160 MG/5ML PO SOLN: 650.0000 mg | Freq: Once | ORAL | Status: AC

## 2020-10-27 MED ORDER — VANCOMYCIN HCL IN DEXTROSE 1-5 GM/200ML-% IV SOLN
1000.0000 mg | Freq: Once | INTRAVENOUS | Status: AC
  Administered 2020-10-27: 1000 mg via INTRAVENOUS
  Filled 2020-10-27: qty 200

## 2020-10-27 MED ORDER — ALBUMIN HUMAN 5 % IV SOLN: INTRAVENOUS | Status: DC | PRN

## 2020-10-27 MED ORDER — CHLORHEXIDINE GLUCONATE 4 % EX LIQD: 30.0000 mL | CUTANEOUS | Status: DC

## 2020-10-27 MED ORDER — ASPIRIN 81 MG PO CHEW: 324.0000 mg | CHEWABLE_TABLET | Freq: Every day | ORAL | Status: DC

## 2020-10-27 MED ORDER — THROMBIN 20000 UNITS EX SOLR
CUTANEOUS | Status: DC | PRN
  Administered 2020-10-27: 20000 [IU] via TOPICAL

## 2020-10-27 MED ORDER — OXYCODONE HCL 5 MG PO TABS
5.0000 mg | ORAL_TABLET | ORAL | Status: DC | PRN
  Filled 2020-10-27: qty 2

## 2020-10-27 MED ORDER — HEMOSTATIC AGENTS (NO CHARGE) OPTIME
TOPICAL | Status: DC | PRN
  Administered 2020-10-27: 1 via TOPICAL

## 2020-10-27 SURGICAL SUPPLY — 103 items
ADH SKN CLS APL DERMABOND .7 (GAUZE/BANDAGES/DRESSINGS) ×3
BAG DECANTER FOR FLEXI CONT (MISCELLANEOUS) ×4 IMPLANT
BLADE CLIPPER SURG (BLADE) ×4 IMPLANT
BLADE STERNUM SYSTEM 6 (BLADE) ×4 IMPLANT
BLADE SURG 15 STRL LF DISP TIS (BLADE) ×5 IMPLANT
BLADE SURG 15 STRL SS (BLADE) ×8
BNDG ELASTIC 4X5.8 VLCR STR LF (GAUZE/BANDAGES/DRESSINGS) ×4 IMPLANT
BNDG ELASTIC 6X5.8 VLCR STR LF (GAUZE/BANDAGES/DRESSINGS) ×4 IMPLANT
BNDG GAUZE ELAST 4 BULKY (GAUZE/BANDAGES/DRESSINGS) ×4 IMPLANT
CANISTER SUCT 3000ML PPV (MISCELLANEOUS) ×4 IMPLANT
CATH THORACIC 28FR (CATHETERS) ×4 IMPLANT
CATH THORACIC 36FR (CATHETERS) ×4 IMPLANT
CATH THORACIC 36FR RT ANG (CATHETERS) ×4 IMPLANT
CLIP VESOCCLUDE MED 24/CT (CLIP) IMPLANT
CLIP VESOCCLUDE SM WIDE 24/CT (CLIP) ×6 IMPLANT
COVER MAYO STAND STRL (DRAPES) ×4 IMPLANT
DERMABOND ADVANCED (GAUZE/BANDAGES/DRESSINGS) ×1
DERMABOND ADVANCED .7 DNX12 (GAUZE/BANDAGES/DRESSINGS) ×2 IMPLANT
DRAPE CARDIOVASCULAR INCISE (DRAPES) ×4
DRAPE HALF SHEET 40X57 (DRAPES) ×4 IMPLANT
DRAPE SLUSH/WARMER DISC (DRAPES) ×4 IMPLANT
DRAPE SRG 135X102X78XABS (DRAPES) ×3 IMPLANT
DRSG COVADERM 4X14 (GAUZE/BANDAGES/DRESSINGS) ×4 IMPLANT
ELECT CAUTERY BLADE 6.4 (BLADE) ×4 IMPLANT
ELECT REM PT RETURN 9FT ADLT (ELECTROSURGICAL) ×8
ELECTRODE REM PT RTRN 9FT ADLT (ELECTROSURGICAL) ×6 IMPLANT
FELT TEFLON 1X6 (MISCELLANEOUS) ×5 IMPLANT
GAUZE SPONGE 4X4 12PLY STRL (GAUZE/BANDAGES/DRESSINGS) ×4 IMPLANT
GAUZE SPONGE 4X4 12PLY STRL LF (GAUZE/BANDAGES/DRESSINGS) ×3 IMPLANT
GLOVE BIO SURGEON STRL SZ7 (GLOVE) IMPLANT
GLOVE BIOGEL PI IND STRL 6 (GLOVE) IMPLANT
GLOVE BIOGEL PI IND STRL 6.5 (GLOVE) ×9 IMPLANT
GLOVE BIOGEL PI IND STRL 7.0 (GLOVE) IMPLANT
GLOVE BIOGEL PI INDICATOR 6 (GLOVE)
GLOVE BIOGEL PI INDICATOR 6.5 (GLOVE) ×3
GLOVE BIOGEL PI INDICATOR 7.0 (GLOVE)
GLOVE ORTHO TXT STRL SZ7.5 (GLOVE) IMPLANT
GLOVE SURG SS PI 6.5 STRL IVOR (GLOVE) ×3 IMPLANT
GLOVE SURG SS PI 7.5 STRL IVOR (GLOVE) ×6 IMPLANT
GLOVE SURG UNDER POLY LF SZ6 (GLOVE) ×9 IMPLANT
GLOVE SURG UNDER POLY LF SZ7 (GLOVE) ×4 IMPLANT
GLOVE TRIUMPH SURG SIZE 7.0 (KITS) ×8 IMPLANT
GOWN STRL REUS W/ TWL LRG LVL3 (GOWN DISPOSABLE) ×12 IMPLANT
GOWN STRL REUS W/ TWL XL LVL3 (GOWN DISPOSABLE) ×3 IMPLANT
GOWN STRL REUS W/TWL LRG LVL3 (GOWN DISPOSABLE) ×16
GOWN STRL REUS W/TWL XL LVL3 (GOWN DISPOSABLE) ×4
HEMOSTAT POWDER SURGIFOAM 1G (HEMOSTASIS) ×12 IMPLANT
HEMOSTAT SURGICEL 2X14 (HEMOSTASIS) ×4 IMPLANT
INSERT FOGARTY 61MM (MISCELLANEOUS) IMPLANT
KIT BASIN OR (CUSTOM PROCEDURE TRAY) ×4 IMPLANT
KIT CATH CPB BARTLE (MISCELLANEOUS) ×4 IMPLANT
KIT SUCTION CATH 14FR (SUCTIONS) ×10 IMPLANT
KIT TURNOVER KIT B (KITS) ×4 IMPLANT
KIT VASOVIEW HEMOPRO 2 VH 4000 (KITS) ×4 IMPLANT
NEEDLE 27GX1/2 REG BEVEL ECLIP (NEEDLE) ×4 IMPLANT
NS IRRIG 1000ML POUR BTL (IV SOLUTION) ×20 IMPLANT
PACK E OPEN HEART (SUTURE) ×4 IMPLANT
PACK OPEN HEART (CUSTOM PROCEDURE TRAY) ×4 IMPLANT
PAD ARMBOARD 7.5X6 YLW CONV (MISCELLANEOUS) ×8 IMPLANT
PAD ELECT DEFIB RADIOL ZOLL (MISCELLANEOUS) ×4 IMPLANT
PENCIL BUTTON HOLSTER BLD 10FT (ELECTRODE) ×4 IMPLANT
POSITIONER HEAD DONUT 9IN (MISCELLANEOUS) ×4 IMPLANT
PUNCH AORTIC ROTATE 4.0MM (MISCELLANEOUS) IMPLANT
PUNCH AORTIC ROTATE 4.5MM 8IN (MISCELLANEOUS) ×4 IMPLANT
PUNCH AORTIC ROTATE 5MM 8IN (MISCELLANEOUS) IMPLANT
SPONGE LAP 18X18 RF (DISPOSABLE) ×3 IMPLANT
SPONGE LAP 4X18 RFD (DISPOSABLE) ×4 IMPLANT
SUPPORT HEART JANKE-BARRON (MISCELLANEOUS) ×4 IMPLANT
SUT BONE WAX W31G (SUTURE) ×4 IMPLANT
SUT MNCRL AB 4-0 PS2 18 (SUTURE) ×3 IMPLANT
SUT PROLENE 3 0 SH DA (SUTURE) IMPLANT
SUT PROLENE 3 0 SH1 36 (SUTURE) ×8 IMPLANT
SUT PROLENE 4 0 RB 1 (SUTURE)
SUT PROLENE 4 0 SH DA (SUTURE) IMPLANT
SUT PROLENE 4-0 RB1 .5 CRCL 36 (SUTURE) IMPLANT
SUT PROLENE 5 0 C 1 36 (SUTURE) IMPLANT
SUT PROLENE 6 0 C 1 30 (SUTURE) ×3 IMPLANT
SUT PROLENE 7 0 BV 1 (SUTURE) IMPLANT
SUT PROLENE 7 0 BV1 MDA (SUTURE) ×8 IMPLANT
SUT PROLENE 8 0 BV175 6 (SUTURE) ×4 IMPLANT
SUT SILK  1 MH (SUTURE)
SUT SILK 1 MH (SUTURE) IMPLANT
SUT SILK 2 0 SH (SUTURE) IMPLANT
SUT STEEL STERNAL CCS#1 18IN (SUTURE) IMPLANT
SUT STEEL SZ 6 DBL 3X14 BALL (SUTURE) IMPLANT
SUT VIC AB 1 CTX 36 (SUTURE) ×16
SUT VIC AB 1 CTX36XBRD ANBCTR (SUTURE) ×10 IMPLANT
SUT VIC AB 2-0 CT1 27 (SUTURE) ×4
SUT VIC AB 2-0 CT1 TAPERPNT 27 (SUTURE) ×2 IMPLANT
SUT VIC AB 2-0 CTX 27 (SUTURE) IMPLANT
SUT VIC AB 3-0 SH 27 (SUTURE)
SUT VIC AB 3-0 SH 27X BRD (SUTURE) IMPLANT
SUT VIC AB 3-0 X1 27 (SUTURE) ×4 IMPLANT
SUT VICRYL 4-0 PS2 18IN ABS (SUTURE) IMPLANT
SYSTEM SAHARA CHEST DRAIN ATS (WOUND CARE) ×4 IMPLANT
TAPE CLOTH SURG 4X10 WHT LF (GAUZE/BANDAGES/DRESSINGS) ×3 IMPLANT
TAPE PAPER 2X10 WHT MICROPORE (GAUZE/BANDAGES/DRESSINGS) ×3 IMPLANT
TOWEL GREEN STERILE (TOWEL DISPOSABLE) ×4 IMPLANT
TOWEL GREEN STERILE FF (TOWEL DISPOSABLE) ×4 IMPLANT
TRAY FOLEY SLVR 16FR LF STAT (SET/KITS/TRAYS/PACK) ×4 IMPLANT
TUBING LAP HI FLOW INSUFFLATIO (TUBING) ×4 IMPLANT
UNDERPAD 30X36 HEAVY ABSORB (UNDERPADS AND DIAPERS) ×4 IMPLANT
WATER STERILE IRR 1000ML POUR (IV SOLUTION) ×8 IMPLANT

## 2020-10-27 NOTE — Progress Notes (Signed)
  Echocardiogram Echocardiogram Transesophageal has been performed.  Augustine Radar 10/27/2020, 9:39 AM

## 2020-10-27 NOTE — Anesthesia Preprocedure Evaluation (Signed)
Anesthesia Evaluation  Patient identified by MRN, date of birth, ID band Patient awake    Reviewed: Allergy & Precautions, NPO status , Patient's Chart, lab work & pertinent test results  Airway Mallampati: III  TM Distance: >3 FB Neck ROM: Full    Dental  (+) Teeth Intact   Pulmonary former smoker,    breath sounds clear to auscultation       Cardiovascular  Rhythm:Regular Rate:Normal     Neuro/Psych    GI/Hepatic   Endo/Other  diabetes  Renal/GU      Musculoskeletal   Abdominal   Peds  Hematology   Anesthesia Other Findings   Reproductive/Obstetrics                             Anesthesia Physical Anesthesia Plan  ASA: III  Anesthesia Plan: General   Post-op Pain Management:    Induction: Intravenous  PONV Risk Score and Plan: Ondansetron and Dexamethasone  Airway Management Planned: Oral ETT  Additional Equipment: Arterial line and Ultrasound Guidance Line Placement  Intra-op Plan:   Post-operative Plan: Extubation in OR  Informed Consent: I have reviewed the patients History and Physical, chart, labs and discussed the procedure including the risks, benefits and alternatives for the proposed anesthesia with the patient or authorized representative who has indicated his/her understanding and acceptance.     Dental advisory given  Plan Discussed with: CRNA and Anesthesiologist  Anesthesia Plan Comments:         Anesthesia Quick Evaluation

## 2020-10-27 NOTE — H&P (Signed)
[address]       [place] [address]             [phone]      Cardiothoracic Surgery Admission History and Physical  PCP is Raina Mina., MD  Referring Provider is Leonie Man, MD          Chief Complaint    Patient presents with    .   Coronary Artery Disease            Surgical consult, Cath 10/08/20          HPI:     The patient is a 64 year old gentleman with a history of hyperlipidemia, type 2 diabetes, and remote smoking who recently fell off his [place] injuring left side of his chest.  His chest pain was felt to be musculoskeletal but he had an abnormal ECG.  A previous stress echocardiogram in 2019 was normal.  He had a gated cardiac CT with coronary CTA and fractional flow reserve performed on 09/29/2020.  This showed moderate to severe multivessel coronary disease with distal RCA occlusion and left-to-right collaterals.  The FFR study showed significant LAD stenosis with an FFR of 0.79 in the mid LAD.  There was a sharp drop off after the first diagonal branch.  The left circumflex had a distal FFR of 0.51.  The RCA was occluded distally.  He subsequently underwent cardiac catheterization on 10/08/2020 which showed severely diseased RCA that was occluded distally with filling of the posterior descending and posterior lateral branches by collaterals from the left circumflex.  The LAD had 70% proximal to mid stenosis with 60% stenosis of the first diagonal branch at its origin.  The left circumflex had 90% mid vessel stenosis with 90% stenosis of the first marginal branch coming off the same area.  Left ventricular ejection fraction was estimated 45% with mild global hypokinesis.  LVEDP was low.     The patient is here today with his wife.  He remains quite active but said that he does have exertional pain in his left upper arm which resolves with rest.  He has had no fatigue.  Denies orthopnea and PND.  He has had no pain or  pressure in his chest.  He denies shortness of breath.          Past Medical History:    Diagnosis   Date    .   Abnormal EKG   03/09/2018    .   Acquired hypothyroidism   03/07/2018    .   Allergy        .   Aortic atherosclerosis ([place])   07/17/2020        Formatting of this note might be different from the original. Seen on CT [facility] 06/2019    .   Arthritis        .   Asthma        .   Cataract        .   Chronic gout without tophus   03/07/2018    .   Coronary artery calcification seen on CAT scan   07/17/2020        Formatting of this note might be different from the original. 06/2019. [facility]    .   Depression   03/07/2018    .   Diplopia   10/11/2018    .   DISH (diffuse idiopathic skeletal hyperostosis)   07/17/2020  Formatting of this note might be different from the original. Seen on CT [facility] 06/2019    .   Dizzy   05/09/2018    .   Gastroesophageal reflux disease without esophagitis   03/07/2018        Formatting of this note might be different from the original. Long term    .   GERD (gastroesophageal reflux disease)   03/07/2018    .   Gout   03/07/2018    .   Heel spur, left   02/07/2020    .   Hyperlipidemia   03/07/2018    .   Hypothyroidism (acquired)   03/07/2018        Formatting of this note might be different from the original. 1990s.    .   Mixed hyperlipidemia   03/07/2018    .   Ocular myasthenia gravis ([place])   07/17/2020        Formatting of this note might be different from the original. Ocular.  Follows at [facility].  Sees neurology and eye doctor.    .   Palpitation   03/08/2018    .   Plantar fasciitis   02/07/2020    .   Primary osteoarthritis involving multiple joints   07/17/2020    .   Smokeless tobacco use   07/17/2020        Formatting of this note might be different from the original. Discussed chantix.   He wants to use. Did well in past. Off label, he understands.    .   Tightness of heel cord, left   02/07/2020    .   Type 2 diabetes mellitus ([place])   03/07/2018    .   Type 2 diabetes mellitus without complication, without long-term current use of insulin ([place])   03/07/2018        Formatting of this note might be different from the original. 2014.                Past Surgical History:    Procedure   Laterality   Date    .   kidney stone extraction            .   KNEE SURGERY            .   LEFT HEART CATH AND CORONARY ANGIOGRAPHY   N/A   10/08/2020        Procedure: LEFT HEART CATH AND CORONARY ANGIOGRAPHY;  Surgeon: Leonie Man, MD;  Location: [facility];  Service: Cardiovascular;  Laterality: N/A;    .   NOSE SURGERY                        Family History    Problem   Relation   Age of Onset    .   Rheum arthritis   Mother        .   Heart Problems   Mother        .   Cancer   Father        .   Hypertension   Father        .   CAD   Father        .   Blindness   Father        .   Colon cancer   Neg Hx        .   Esophageal cancer  Neg Hx        .   Rectal cancer   Neg Hx        .   Stomach cancer   Neg Hx              Social History   Social History             Tobacco Use    .   Smoking status:   Former Smoker            Types:   Cigars, Pipe    .   Smokeless tobacco:   Current User            Types:   Snuff    .   Tobacco comment: stopped smoking pipe tobacco 30 years ago    Vaping Use    .   Vaping Use:   Never used    Substance Use Topics    .   Alcohol use:   Yes            Comment: Occasional Beer     .   Drug use:   Never                 Current Outpatient Medications    Medication   Sig   Dispense   Refill     .   acetaminophen (TYLENOL) 500 MG tablet   Take 500-1,000 mg by mouth every 6 (six) hours as needed (for pain.).            Marland Kitchen   aspirin EC 81 MG tablet   Take 81 mg by mouth every evening.             Marland Kitchen   Dextran 70-Hypromellose, PF, (ARTIFICIAL TEARS PF) 0.1-0.3 % SOLN   Place 1 drop into both eyes 3 (three) times daily as needed (dry/irritated eyes.).            Marland Kitchen   empagliflozin (JARDIANCE) 10 MG TABS tablet   Take 10 mg by mouth daily.            Marland Kitchen   levothyroxine (SYNTHROID) 125 MCG tablet   Take 125 mcg by mouth daily.            .   metFORMIN (GLUCOPHAGE) 1000 MG tablet   Take 1,000 mg by mouth 2 (two) times daily with a meal.            .   metoprolol succinate (TOPROL XL) 25 MG 24 hr tablet   Take 1 tablet (25 mg total) by mouth daily.   90 tablet   3    .   Omega-3 Fatty Acids (FISH OIL ULTRA) 1400 MG CAPS   Take 1,400 mg by mouth in the morning and at bedtime.            .   pantoprazole (PROTONIX) 40 MG tablet   Take 40 mg by mouth daily.            Marland Kitchen   PARoxetine (PAXIL) 40 MG tablet   Take 40 mg by mouth daily.            Marland Kitchen   pyridostigmine (MESTINON) 60 MG tablet   Take 60 mg by mouth in the morning, at noon, and at bedtime.            .   rosuvastatin (CRESTOR) 10 MG tablet   Take 1 tablet (10 mg total) by mouth 3 (three)  times a week. (Patient taking differently: Take 10 mg by mouth every Monday, Wednesday, and Friday at 8 PM. In the evening)   90 tablet   3    .   Semaglutide,0.25 or 0.5MG /DOS, (OZEMPIC, 0.25 OR 0.5 MG/DOSE,) 2 MG/1.5ML SOPN   Inject 0.5 mg into the skin every Wednesday.             .   metoprolol tartrate (LOPRESSOR) 100 MG tablet   Take 1 tablet (100 mg total) by mouth once for 1 dose. (Patient not taking: Reported on 10/02/2020)   1 tablet   0        No current facility-administered medications for this visit.                 Allergies    Allergen   Reactions    .   Codeine   Itching    .   Hydrocodone-Acetaminophen   Hives and Itching    .   Lipitor [Atorvastatin]   Other (See Comments)            Aches and pains    .   Latex   Itching          Review of Systems   Constitutional: Negative for activity change and fatigue.   HENT: Negative.    Eyes: Negative.    Respiratory: Negative for shortness of breath.         History of asthma   Cardiovascular: Negative for chest pain, palpitations and leg swelling.   Gastrointestinal: Positive for diarrhea.        Reflux   Endocrine: Negative.    Genitourinary: Negative.    Musculoskeletal: Positive for arthralgias.   Skin: Negative.    Neurological: Negative.    Hematological: Negative.    Psychiatric/Behavioral: Negative.          BP 106/66 (BP Location: Right Arm, Patient Position: Sitting)   Pulse (!) 56   Resp 18   Ht 5\' 6"  (1.676 m)   Wt 193 lb 9.6 oz (87.8 kg)   SpO2 95% Comment: RA with mask on  BMI 31.25 kg/m   Physical Exam  Constitutional:       Appearance: Normal appearance. He is obese.     Comments: BMI 31.25 kg/m  HENT:      Head: Normocephalic and atraumatic.  Eyes:      Extraocular Movements: Extraocular movements intact.     Pupils: Pupils are equal, round, and reactive to light.  Neck:      Vascular: No carotid bruit.  Cardiovascular:      Rate and Rhythm: Normal rate and regular rhythm.     Pulses: Normal pulses.     Heart sounds: Normal heart sounds. No murmur heard.   Pulmonary:      Effort: Pulmonary effort is normal.     Breath sounds: Normal breath sounds.  Abdominal:     General: There is no distension.     Tenderness: There is no abdominal tenderness.   Musculoskeletal:         General: No swelling.   Skin:     General: Skin is warm and dry.  Neurological:      General: No focal deficit present.     Mental Status: He is  alert and oriented to person, place, and time.  Psychiatric:        Mood and Affect: Mood normal.        Behavior: Behavior normal.  Diagnostic Tests:     Physicians        Panel Physicians    Referring Physician    Case Authorizing Physician     Leonie Man, MD (Primary)            Procedures       LEFT HEART CATH AND CORONARY ANGIOGRAPHY    Conclusion        .There is mild left ventricular systolic dysfunction.   .LV end diastolic pressure is normal.   .There is no aortic valve stenosis.   .Prox RCA lesion is 95% stenosed. Prox RCA to Mid RCA lesion is 50% stenosed. Mid RCA lesion is 70% stenosed. Mid RCA to Dist RCA lesion is 100% stenosed. (Fills via left-right collaterals from LCx)   .Ost LM to Ost LAD lesion is 20% stenosed with 30% stenosed side branch in Ost Cx.   Colon Flattery LAD to Prox LAD lesion is 30% stenosed.   .Mid Cx lesion is 90% stenosed with 90% stenosed side branch in 1st Mrg.   .Mid Cx to Dist Cx lesion is 70% stenosed with 95% stenosed side branch in 2nd Mrg.   .Prox LAD to Mid LAD lesion is 70% stenosed with 60% stenosed side branch in 1st Diag.      SUMMARY  .Severe three-vessel disease:    ?Extensive proximal to mid 90 having 70 and 80% stenosis followed by 100% CTO (fills via left to right collaterals from AVG LCx);    ?LEFT MAIN diffuse 10 to 20% stenosis, 20% ostial LCx with 90% focal concentric stenosis at OM1 (1st Mrg) followed by a second 70% stenosis at a very small caliber OM 2,    ?Segmental calcified 40% proximal LAD with DFR positive long segment of 60-65% at the takeoff of D1 (DFR was 0.85, CT FFR was 0.79).   ?LAD after D1 (1st Diag) bifurcates more distally into tandem vessels of D2 and basely the septal LAD-is relatively small caliber   .Mildly reduced LVEF with mild global hypokinesis EF roughly 45% by visual estimate.  Low LVEDP          RECOMMENDATION  .He is relatively asymptomatic, recommend outpatient CVTS consultation -> need to consider the benefits of full revascularization with CABG versus PCI of the bifurcation LCx-OM1 lesion and bifurcation LAD-D1 lesion.   .No antiplatelet agent besides aspirin with planned CVTS consultation   .Continue aggressive GUIDELINE DIRECTED MEDICAL THERAPY      Follow-up with Dr. Bettina Gavia        That way  Glenetta Hew, MD        Recommendations       Antiplatelet/Anticoag   Recommend Aspirin $RemoveBeforeDEI'81mg'[id number]$  daily for moderate CAD.    Discharge Date   In the absence of any other complications or medical issues, we expect the patient to be ready for discharge from a cath perspective on 10/08/2020. As he is asymptomatic, okay to schedule outpatient CVTS consultation.  The patient will determine his desires for CABG versus two-vessel (both bifurcation) PCI.  I explained to him the risks and benefits of PCI, but needs to hear the risk and benefits of CABG.  CABG would allow for complete revascularization.    Indications       Abnormal cardiac CT angiography [R93.1 (ICD-10-CM)]    Coronary artery disease involving native coronary artery of native heart with angina pectoris ([place]) [D62.229 (ICD-10-CM)]    Procedural Details       Technical Details   PCP:  Raina Mina.,  MD      Cardiologist:  Norman Herrlich, MD     MATAIO MELE is a 64 y.o. male with a hx of chest pain hyperlipidemia and coronary artery calcification last seen 09/12/2020. He was referred for cardiac CTA which showed a very high calcium score 739 88th percentile for age and sex he had moderate to severe multivessel CAD with 1 to 24% left main coronary artery 50 to 69% left anterior descending coronary artery stenosis proximal and mid vessel 25 to 49% left circumflex coronary artery and 50 to 69% right coronary artery stenosis.  Subsequent fractional flow reserve showed diminished flow with  hemodynamic stenosis in the left anterior descending coronary artery and left circumflex coronary artery. He was seen by Dr. Dulce Sellar and after reviewing the coronary CTA findings, was referred for invasive evaluation with cardiac catheterization.  Time Out: Verified patient identification, verified procedure, site/side was marked, verified correct patient position, special equipment/implants available, medications/allergies/relevent history reviewed, required imaging and test results available. Performed.  Access:  RIGHT Radial Artery: 6 Fr sheath -- Seldinger technique using Micropuncture Kit -- Direct ultrasound guidance used.  Permanent image obtained and placed on chart. -- 10 mL radial cocktail IA; 5000 Units IV Heparin  Left Heart Catheterization: 5 &6Fr Catheters advanced or exchanged over a J-wire under direct fluoroscopic guidance into the ascending aorta; TIG 4.0 catheter advanced first.  * LV Hemodynamics (LV Gram): TIG 4.0 Catheter * Left Coronary Artery Cineangiography: TIG 4.0 Catheter ; 6Fr XB3 Guide Catheter for DFR * Right Coronary Artery Cineangiography: TIG 4.0 Catheter   Review of initial angiography revealed: Occluded RCA with left to right collaterals.  Significant lesion in the proximal LCx but questionable ostial LCx lesion and mid LAD lesion. ->  CTA-FFR suggested mid LAD lesion was 0.79, however no clear obvious lesion is seen that would be greater than 50%..  Preparations are made for DFR/FFR evaluation of concerning lesions.  DFR of LAD performed:  6Fr XB3 Guide Catheter for DFR, COMET-Pressurewire -> Additional bolus of IV heparin administered to treatment ACT> 250 seconds  Upon completion of post DFR angiography, the comet wire was removed, then the catheter was removed completely out of the body over a wire, without complication.  Radial sheath removed in the Cardiac Catheterization lab with TR Band placed for hemostasis.  TR Band: 1340  Hours; 13 mL  air  MEDICATIONS SQ Lidocaine 4 mL Radial Cocktail: 3 mg Verapmil in 10 mL NS Heparin: Total of 13,000 units IV NS 500 mL bolus Estimated blood loss <50 mL.   During this procedure medications were administered to achieve and maintain moderate conscious sedation while the patient's heart rate, blood pressure, and oxygen saturation were continuously monitored and I was present face-to-face 100% of this time.    Medications  (Filter: Administrations occurring from 1217 to 1342 on 10/08/20)     (important)  Continuous medications are totaled by the amount administered until 10/08/20 1342.    midazolam (VERSED) injection (mg)  Total dose:  1 mg      Date/Time       Rate/Dose/Volume   Action    10/08/20 1224       1 mg   Given       Heparin (Porcine) in NaCl 1000-0.9 UT/500ML-% SOLN (mL)  Total volume:  1,000 mL      Date/Time       Rate/Dose/Volume   Action    10/08/20 1225       500 mL  Given    1225       500 mL   Given       lidocaine (PF) (XYLOCAINE) 1 % injection (mL)  Total volume:  2 mL      Date/Time       Rate/Dose/Volume   Action    10/08/20 1239       2 mL   Given       Radial Cocktail/Verapamil only (mL)  Total volume:  10 mL      Date/Time       Rate/Dose/Volume   Action    10/08/20 1240       10 mL   Given       heparin sodium (porcine) injection (Units)  Total dose:  13,000 Units      Date/Time       Rate/Dose/Volume   Action    10/08/20 1243       5,000 Units   Given    1259       5,000 Units   Given    1313       3,000 Units   Given       0.9 %  sodium chloride infusion (mL/kg/hr)  Total dose:  Cannot be calculated* Dosing weight:  82.9  *Continuous medication not stopped within the calculation time range.      Date/Time       Rate/Dose/Volume   Action    10/08/20 1323       1 mL/kg/hr - 82.9  mL/hr   New Bag/Given       iohexol (OMNIPAQUE) 350 MG/ML injection (mL)  Total volume:  90 mL      Date/Time       Rate/Dose/Volume   Action    10/08/20 1338       90 mL   Given       Sedation Time       Sedation Time Physician-1: 1 hour 14 minutes 36 seconds    Contrast         Medication Name   Total Dose    iohexol (OMNIPAQUE) 350 MG/ML injection   90 mL       Radiation/Fluoro       Fluoro time: 8.3 (min)  DAP: 21426 (mGycm2)  Cumulative Air Kerma: 741 (mGy)    Complications       Complications documented before study signed (10/08/2020  2:29 PM)         No complications were associated with this study.    Documented by Leonie Man, MD - 10/08/2020  1:57 PM       Coronary Findings     Diagnostic  Dominance: Right    Left Main    Ost LM to Ost LAD lesion is 20% stenosed with 30% stenosed side branch in Ost Cx. The lesion is mildly calcified.    Left Anterior Descending    Vessel is small.    Ost LAD to Prox LAD lesion is 30% stenosed. The lesion is segmental. The lesion is calcified.    Prox LAD to Mid LAD lesion is 70% stenosed with 60% stenosed side branch in 1st Diag. The lesion is located at the bifurcation, eccentric, concentric and smooth. CTA FFR    First Diagonal Branch    Vessel is small in size.    First Septal Branch    Vessel is small in size.    Ramus Intermedius    Vessel is small.    Left Circumflex  Vessel is moderate in size.    Mid Cx lesion is 90% stenosed with 90% stenosed side branch in 1st Mrg. The lesion is located at the bifurcation.    Mid Cx to Dist Cx lesion is 70% stenosed with 95% stenosed side branch in 2nd Mrg. The lesion is focal and concentric.    First Obtuse Marginal Branch    Vessel is small in size.    Second Obtuse Marginal Branch    Vessel is small in size.    First Left Posterolateral Branch     Vessel is small in size.    Second Left Posterolateral Branch    Vessel is small in size.    Left Posterior Atrioventricular Artery    Vessel is large in size.    Right Coronary Artery    Prox RCA lesion is 95% stenosed. The lesion is segmental and eccentric.    Prox RCA to Mid RCA lesion is 50% stenosed. The lesion is segmental and irregular.    Mid RCA lesion is 70% stenosed. The lesion is focal and concentric.    Mid RCA to Dist RCA lesion is 100% stenosed. The lesion is chronically occluded.    Acute Marginal Branch    Vessel is small in size.    Right Ventricular Branch    Vessel is small in size.    Right Posterior Atrioventricular Artery    Collaterals    RPAV filled by collaterals from [place].         First Right Posterolateral Branch    Vessel is small in size.    Second Right Posterolateral Branch    Vessel is moderate in size.    Intervention     No interventions have been documented.  Wall Motion                Resting            untitled image        All segments of the heart are hypokinetic.    Imaging not adequate to evaluate regional wall motion abnormality            untitled image       Left Heart       Left Ventricle   The left ventricular size is in the upper limits of normal. There is mild left ventricular systolic dysfunction. LV end diastolic pressure is normal. Calculated EF is 45%. There are LV function abnormalities due to global hypokinesis.    Aortic Valve   There is no aortic valve stenosis. There is normal aortic valve motion.    Coronary Diagrams     Diagnostic  Dominance: Right  untitled image  Intervention     Implants          No implant documentation for this case.    Syngo Images      Show images for CARDIAC CATHETERIZATION  Images on Long Term Storage      Show images for Bryson, Gavia "Ronalee Belts"   Link to Procedure  Log       Procedure Log       Hemo Data             Most Recent Value     AO Systolic Pressure   79 mmHg    AO Diastolic Pressure   43 mmHg    AO Mean   57 mmHg    LV Systolic Pressure   87 mmHg    LV Diastolic Pressure  3 mmHg    LV EDP   9 mmHg    AOp Systolic Pressure   81 mmHg    AOp Diastolic Pressure   43 mmHg    AOp Mean Pressure   58 mmHg    LVp Systolic Pressure   82 mmHg    LVp Diastolic Pressure   4 mmHg    LVp EDP Pressure   9 mmHg       EXAM:  CT FFR ANALYSIS     CLINICAL DATA:  ECG abnormal, 45yr CHD risk 10-20%, not treadmill  candidate Chest pain/anginal equiv, 55yr CHD risk > 20%, not  treadmill candidate     FINDINGS:  FFRct analysis was performed on the original cardiac CT angiogram  dataset. Diagrammatic representation of the FFRct analysis is  provided in a separate PDF document in PACS. This dictation was  created using the PDF document and an interactive 3D model of the  results. 3D model is not available in the EMR/PACS. Normal FFR range  is >0.80.     1. Left Main:  No significant stenosis. FFR = 0.99     2. LAD: Significant stenosis. Proximal FFR = 0.94, Mid FFR = 0.79  (sharp drop after the takeoff of D1), Distal FFR = 0.76  3. LCX: Significant stenosis. Proximal FFR = 0.96, Distal FFR = 0.51  4. RCA: Significant stenosis. Proximal FFR = 1.00, Mid FFR = 0.85  (sharp drop after the proximal stenosis), Distal FFR = Occluded     IMPRESSION:  1. CT FFR analysis demonstrates significant multivessel CAD,  including an occluded distal RCA (suspect collateralized),  significant mid-LAD stenosis and significant proximal to mid LCX  stenosis.     2.  Cardiac catheterization is recommended.        Electronically Signed    By: Pixie Casino M.D.    On: 09/30/2020 08:29     Impression:     This 64 year old diabetic gentleman has severe  three-vessel coronary disease with mild left ventricular systolic dysfunction and mild anginal symptoms consisting of left arm aching pain with exertion.  He is still quite active.  I think revascularization would be the best treatment for him since he has a large myocardial territory at risk with high-grade left circumflex stenosis compromising the lateral wall as well as the inferior wall through collaterals to the distal RCA.  His left circumflex and LAD stenoses are at bifurcations and I think percutaneous intervention would be higher risk due to the possibility of occluding blood supply to the diagonal and obtuse marginal branches.  I think his best long-term prognosis will be with coronary bypass graft surgery.  I reviewed the cardiac catheterization films with the patient and his wife answered their questions. His upper extremity arterial dopplers show decrease > 50% with left radial compression so can't use that vessel. Right arm is ulnar dependent so will put arterial line on that side. They are in agreement with proceeding with coronary bypass surgery. I discussed the operative procedure with the patient and his wife including alternatives, benefits and risks; including but not limited to bleeding, blood transfusion, infection, stroke, myocardial infarction, graft failure, heart block requiring a permanent pacemaker, organ dysfunction, and death.  Alyson Locket Read understands and agrees to proceed.    Plan:   Coronary bypass graft surgery.      Gaye Pollack, MD  [facility]  [phone]

## 2020-10-27 NOTE — Transfer of Care (Signed)
Immediate Anesthesia Transfer of Care Note  Patient: Derek Moore  Procedure(s) Performed: CORONARY ARTERY BYPASS GRAFTING (CABG), ON PUMP, TIMES FOUR, USING LEFT INTERNAL MAMMARY ARTERY AND ENDOSCOPICALLY HARVESTED RIGHT GREATER SAPHENOUS VEIN (N/A Chest) TRANSESOPHAGEAL ECHOCARDIOGRAM (TEE) (N/A )  Patient Location: ICU  Anesthesia Type:General  Level of Consciousness: sedated and Patient remains intubated per anesthesia plan  Airway & Oxygen Therapy: Patient remains intubated per anesthesia plan and Patient placed on Ventilator (see vital sign flow sheet for setting)  Post-op Assessment: Report given to RN and Post -op Vital signs reviewed and stable  Post vital signs: Reviewed and stable  Last Vitals:  Vitals Value Taken Time  BP    Temp    Pulse    Resp    SpO2      Last Pain:  Vitals:   10/27/20 0617  TempSrc:   PainSc: 0-No pain         Complications: No complications documented.

## 2020-10-27 NOTE — Anesthesia Procedure Notes (Addendum)
Central Venous Catheter Insertion Performed by: Kipp Brood, MD, anesthesiologist Start/End11/29/2021 6:50 AM, 10/27/2020 7:00 AM Patient location: Pre-op. Preanesthetic checklist: patient identified, IV checked, site marked, risks and benefits discussed, surgical consent, monitors and equipment checked, pre-op evaluation, timeout performed and anesthesia consent Lidocaine 1% used for infiltration and patient sedated Hand hygiene performed  and maximum sterile barriers used  Catheter size: 8.5 Fr Sheath introducer Procedure performed using ultrasound guided technique. Ultrasound Notes:anatomy identified, needle tip was noted to be adjacent to the nerve/plexus identified, no ultrasound evidence of intravascular and/or intraneural injection and image(s) printed for medical record Attempts: 1 Following insertion, line sutured and dressing applied. Post procedure assessment: blood return through all ports, free fluid flow and no air  Patient tolerated the procedure well with no immediate complications.

## 2020-10-27 NOTE — Progress Notes (Signed)
      [address]       [place] [address]             [phone]      S/p CABG x 4  Intubated, starting to wake up  Started on amiodarone for atrial fib  On 40 mg of Neo-  119/60 90 paced  CI = 2.4 Hct = 30, K= 4.4 ABG 7.29/50/141/26- vent adjustments made earlier  Careful with vent wean/ extubation  Stable from cardiac standpoint  Viviann Spare C. Dorris Fetch, MD [facility] [phone]

## 2020-10-27 NOTE — Anesthesia Procedure Notes (Addendum)
Central Venous Catheter Insertion Performed by: Kipp Brood, MD, anesthesiologist Start/End11/29/2021 6:50 AM, 10/27/2020 7:00 AM Patient location: Pre-op. Preanesthetic checklist: patient identified, IV checked, site marked, risks and benefits discussed, surgical consent, monitors and equipment checked, pre-op evaluation, timeout performed and anesthesia consent Hand hygiene performed  and maximum sterile barriers used  PA cath was placed.Swan type:thermodilution Procedure performed without using ultrasound guided technique. Attempts: 1 Following insertion, line sutured, dressing applied and Biopatch. Post procedure assessment: blood return through all ports  Patient tolerated the procedure well with no immediate complications.

## 2020-10-27 NOTE — Interval H&P Note (Signed)
History and Physical Interval Note:  10/27/2020 7:14 AM  Derek Moore  has presented today for surgery, with the diagnosis of CAD.  The various methods of treatment have been discussed with the patient and family. After consideration of risks, benefits and other options for treatment, the patient has consented to  Procedure(s): CORONARY ARTERY BYPASS GRAFTING (CABG) (N/A) possible RADIAL ARTERY HARVEST (Left) TRANSESOPHAGEAL ECHOCARDIOGRAM (TEE) (N/A) as a surgical intervention.  The patient's history has been reviewed, patient examined, no change in status, stable for surgery.  I have reviewed the patient's chart and labs.  Questions were answered to the patient's satisfaction.     [name]

## 2020-10-27 NOTE — Anesthesia Procedure Notes (Signed)
Procedure Name: Intubation Date/Time: 10/27/2020 7:51 AM Performed by: Kipp Brood, MD Pre-anesthesia Checklist: Patient identified, Emergency Drugs available, Suction available and Patient being monitored Patient Re-evaluated:Patient Re-evaluated prior to induction Oxygen Delivery Method: Circle system utilized Preoxygenation: Pre-oxygenation with 100% oxygen Induction Type: IV induction Ventilation: Mask ventilation without difficulty and Oral airway inserted - appropriate to patient size Laryngoscope Size: Hyacinth Meeker and 2 Grade View: Grade I Tube type: Oral Tube size: 7.5 mm Number of attempts: 1 Airway Equipment and Method: Stylet and Oral airway Placement Confirmation: ETT inserted through vocal cords under direct vision,  positive ETCO2 and breath sounds checked- equal and bilateral Secured at: 22 cm Tube secured with: Tape Dental Injury: Teeth and Oropharynx as per pre-operative assessment

## 2020-10-27 NOTE — Brief Op Note (Addendum)
10/27/2020  11:38 AM  PATIENT:  Derek Moore  64 y.o. male  PRE-OPERATIVE DIAGNOSIS:  Coronary Artery Disease  POST-OPERATIVE DIAGNOSIS:  Coronary Artery Disease  PROCEDURES:   CORONARY ARTERY BYPASS GRAFTING   LIMA-->D2 (LAD too small for grafting) SVG-->D1 SVG-->OM1 SVG-->PDA  ENDOSCOPIC VEIN HARVESTING Vein harvest time  Vein prep time  TRANSESOPHAGEAL ECHOCARDIOGRAM (TEE) (N/A)  SURGEON:  [name], MD   PHYSICIAN ASSISTANT: Adyen Bifulco  ASSISTANTS: Staff RNFA  ANESTHESIA:   general  EBL: per perfusion and anesthesia records  BLOOD ADMINISTERED: Platlets, (1 pheresis)  DRAINS: Medistinal and left pleural tubes   LOCAL MEDICATIONS USED:  NONE  SPECIMEN:  No Specimen  DISPOSITION OF SPECIMEN:  N/A  COUNTS:  YES  DICTATION: .Dragon Dictation  PLAN OF CARE: Admit to inpatient   PATIENT DISPOSITION:  ICU - intubated and hemodynamically stable.   Delay start of Pharmacological VTE agent (>24hrs) due to surgical blood loss or risk of bleeding: yes

## 2020-10-27 NOTE — Anesthesia Procedure Notes (Signed)
Arterial Line Insertion Start/End11/29/2021 6:45 AM, 10/27/2020 6:50 AM Performed by: Waynard Edwards, CRNA, CRNA  Patient location: Pre-op. Preanesthetic checklist: patient identified, IV checked, site marked, risks and benefits discussed, surgical consent, monitors and equipment checked, pre-op evaluation, timeout performed and anesthesia consent Patient sedated Right, radial was placed Catheter size: 20 G Hand hygiene performed , maximum sterile barriers used  and Seldinger technique used Allen's test indicative of satisfactory collateral circulation Attempts: 1 Procedure performed without using ultrasound guided technique. Following insertion, dressing applied and Biopatch. Post procedure assessment: normal and unchanged  Patient tolerated the procedure well with no immediate complications.

## 2020-10-27 NOTE — Progress Notes (Signed)
Weaning parameters finished and NIF/VC done. NIF -22, VC 600. ABG parameters not met. Patient placed back on full support per MD. Will draw another ABG in a couple hours.

## 2020-10-27 NOTE — Op Note (Signed)
CARDIOVASCULAR SURGERY OPERATIVE NOTE  10/27/2020  Surgeon:  [name], MD  First Assistant:  Jillyn Hidden,  PA-C   Preoperative Diagnosis:  Severe multi-vessel coronary artery disease   Postoperative Diagnosis:  Same   Procedure:  1. Median Sternotomy 2. Extracorporeal circulation 3.   Coronary artery bypass grafting x 4   Left internal mammary artery graft to the second diagonal branch of the LAD  SVG to diagonal 1  SVG to OM1  SVG to PDA 4.   Endoscopic vein harvest from the right leg   Anesthesia:  General Endotracheal   Clinical History/Surgical Indication:  This 64 year old diabetic gentleman has severe three-vessel coronary disease with mild left ventricular systolic dysfunction and mild anginal symptoms consisting of left arm aching pain with exertion.  He is still quite active.  I think revascularization would be the best treatment for him since he has a large myocardial territory at risk with high-grade left circumflex stenosis compromising the lateral wall as well as the inferior wall through collaterals to the distal RCA.  His left circumflex and LAD stenoses are at bifurcations and I think percutaneous intervention would be higher risk due to the possibility of occluding blood supply to the diagonal and obtuse marginal branches.  I think his best long-term prognosis will be with coronary bypass graft surgery.  I reviewed the cardiac catheterization films with the patient and his wife answered their questions. His upper extremity arterial dopplers show decrease > 50% with left radial compression so can't use that vessel. Right arm is ulnar dependent so will put arterial line on that side. They are in agreement with proceeding with coronary bypass surgery. I discussed the operative procedure with the patient and his wife including alternatives, benefits and risks;  including but not limited to bleeding, blood transfusion, infection, stroke, myocardial infarction, graft failure, heart block requiring a permanent pacemaker, organ dysfunction, and death.  Derek Moore understands and agrees to proceed.   Preparation:  The patient was seen in the preoperative holding area and the correct patient, correct operation were confirmed with the patient after reviewing the medical record and catheterization. The consent was signed by me. Preoperative antibiotics were given. A pulmonary arterial line and radial arterial line were placed by the anesthesia team. The patient was taken back to the operating room and positioned supine on the operating room table. After being placed under general endotracheal anesthesia by the anesthesia team a foley catheter was placed. The neck, chest, abdomen, and both legs were prepped with betadine soap and solution and draped in the usual sterile manner. A surgical time-out was taken and the correct patient and operative procedure were confirmed with the nursing and anesthesia staff.   Cardiopulmonary Bypass:  A median sternotomy was performed. The pericardium was opened in the midline. Right ventricular function appeared normal. The ascending aorta was of normal size and had no palpable plaque. There were no contraindications to aortic cannulation or cross-clamping. The patient was fully systemically heparinized and the ACT was maintained > 400 sec. The proximal aortic arch was cannulated with a 20 F aortic cannula for arterial inflow. Venous cannulation was performed via the right atrial appendage using a two-staged venous cannula. An antegrade cardioplegia/vent cannula was inserted into the mid-ascending aorta. Aortic occlusion was performed with a single cross-clamp. Systemic cooling to 32 degrees Centigrade and topical cooling of the heart with iced saline were used. Hyperkalemic antegrade cold blood cardioplegia was used to induce  diastolic arrest and was then  given at about 20 minute intervals throughout the period of arrest to maintain myocardial temperature at or below 10 degrees centigrade. A temperature probe was inserted into the interventricular septum and an insulating pad was placed in the pericardium.   Left internal mammary artery harvest:  The left side of the sternum was retracted using the Rultract retractor. The left internal mammary artery was harvested as a pedicle graft. All side branches were clipped. It was a medium-sized vessel of good quality with excellent blood flow. It was ligated distally and divided. It was sprayed with topical papaverine solution to prevent vasospasm.   Endoscopic vein harvest:  The right greater saphenous vein was harvested endoscopically through a 2 cm incision medial to the right knee. It was harvested from the upper thigh to below the knee. It was a medium-sized vein of good quality. The side branches were all ligated with 4-0 silk ties.    Coronary arteries:  The coronary arteries were examined.   LAD:  Intramyocardial in the proximal and mid portions. Distal vessel small. The second diagonal is a medium caliber vessel with no distal disease so the LIMA was anastomosed to that. It communicated freely with the LAD on cath. The D1 was small but graftable with no distal disease.  LCX:  OM1 is medium caliber vessel with no distal disease. The OM2 is small and not graftable.  RCA:  PDA is a medium caliber vessel with no distal disease. PL is smaller.   Grafts:  1. LIMA to the D2: 1.75 mm. It was sewn end to side using 8-0 prolene continuous suture. 2. SVG to D1:  1.5 mm. It was sewn end to side using 7-0 prolene continuous suture. 3. SVG to OM1:  1.6 mm. It was sewn end to side using 7-0 prolene continuous suture. 4. SVG to PDA:  1.75 mm. It was sewn end to side using 7-0 prolene continuous suture.  The proximal vein graft anastomoses were performed to the  mid-ascending aorta using continuous 6-0 prolene suture. Graft markers were placed around the proximal anastomoses.   Completion:  The patient was rewarmed to 37 degrees Centigrade. The clamp was removed from the LIMA pedicle and there was rapid warming of the septum and return of ventricular fibrillation. The crossclamp was removed with a time of 3 minutes. There was spontaneous return of sinus rhythm. The distal and proximal anastomoses were checked for hemostasis. The position of the grafts was satisfactory. Two temporary epicardial pacing wires were placed on the right atrium and two on the right ventricle. The patient was weaned from CPB without difficulty on no inotropes. CPB time was 110 minutes. Cardiac output was 4 LPM. TEE showed normal LV systolic function.  Heparin was fully reversed with protamine and the aortic and venous cannulas removed. Hemostasis was achieved. Mediastinal and left pleural drainage tubes were placed. The sternum was closed with double #6 stainless steel wires. The fascia was closed with continuous # 1 vicryl suture. The subcutaneous tissue was closed with 2-0 vicryl continuous suture. The skin was closed with 3-0 vicryl subcuticular suture. All sponge, needle, and instrument counts were reported correct at the end of the case. Dry sterile dressings were placed over the incisions and around the chest tubes which were connected to pleurevac suction. The patient was then transported to the surgical intensive care unit in stable condition.

## 2020-10-27 NOTE — Anesthesia Postprocedure Evaluation (Signed)
Anesthesia Post Note  Patient: Derek Moore  Procedure(s) Performed: CORONARY ARTERY BYPASS GRAFTING (CABG), ON PUMP, TIMES FOUR, USING LEFT INTERNAL MAMMARY ARTERY AND ENDOSCOPICALLY HARVESTED RIGHT GREATER SAPHENOUS VEIN (N/A Chest) TRANSESOPHAGEAL ECHOCARDIOGRAM (TEE) (N/A )     Patient location during evaluation: SICU Anesthesia Type: General Level of consciousness: sedated and patient remains intubated per anesthesia plan Pain management: pain level controlled Vital Signs Assessment: post-procedure vital signs reviewed and stable Respiratory status: patient remains intubated per anesthesia plan and patient on ventilator - see flowsheet for VS Cardiovascular status: stable Anesthetic complications: no   No complications documented.  Last Vitals:  Vitals:   10/27/20 1700 10/27/20 1715  BP: (!) 73/59 (!) 75/60  Pulse: 93 (!) 35  Resp: 16 (!) 27  Temp: 37.3 C 37.4 C  SpO2: 100% 92%    Last Pain:  Vitals:   10/27/20 1438  TempSrc: Core  PainSc:                  Callie Bunyard COKER

## 2020-10-28 ENCOUNTER — Encounter (HOSPITAL_COMMUNITY): Payer: Self-pay | Admitting: Surgery

## 2020-10-28 ENCOUNTER — Inpatient Hospital Stay (HOSPITAL_COMMUNITY): Payer: Commercial Managed Care - PPO

## 2020-10-28 LAB — POCT I-STAT 7, (LYTES, BLD GAS, ICA,H+H)
Acid-Base Excess: 0 mmol/L (ref 0.0–2.0)
Acid-Base Excess: 0 mmol/L (ref 0.0–2.0)
Acid-Base Excess: 1 mmol/L (ref 0.0–2.0)
Acid-Base Excess: 2 mmol/L (ref 0.0–2.0)
Acid-Base Excess: 0 mmol/L (ref 0.0–2.0)
Bicarbonate: 27.5 mmol/L (ref 20.0–28.0)
Bicarbonate: 25.6 mmol/L (ref 20.0–28.0)
Bicarbonate: 26.6 mmol/L (ref 20.0–28.0)
Bicarbonate: 26.7 mmol/L (ref 20.0–28.0)
Bicarbonate: 26 mmol/L (ref 20.0–28.0)
Calcium, Ion: 1.21 mmol/L (ref 1.15–1.40)
Calcium, Ion: 1.14 mmol/L — ABNORMAL LOW (ref 1.15–1.40)
Calcium, Ion: 1.23 mmol/L (ref 1.15–1.40)
Calcium, Ion: 1.25 mmol/L (ref 1.15–1.40)
Calcium, Ion: 1.24 mmol/L (ref 1.15–1.40)
HCT: 30 % — ABNORMAL LOW (ref 39.0–52.0)
HCT: 30 % — ABNORMAL LOW (ref 39.0–52.0)
HCT: 31 % — ABNORMAL LOW (ref 39.0–52.0)
HCT: 32 % — ABNORMAL LOW (ref 39.0–52.0)
HCT: 30 % — ABNORMAL LOW (ref 39.0–52.0)
Hemoglobin: 10.2 g/dL — ABNORMAL LOW (ref 13.0–17.0)
Hemoglobin: 10.2 g/dL — ABNORMAL LOW (ref 13.0–17.0)
Hemoglobin: 10.5 g/dL — ABNORMAL LOW (ref 13.0–17.0)
Hemoglobin: 10.9 g/dL — ABNORMAL LOW (ref 13.0–17.0)
Hemoglobin: 10.2 g/dL — ABNORMAL LOW (ref 13.0–17.0)
O2 Saturation: 99 %
O2 Saturation: 99 %
O2 Saturation: 99 %
O2 Saturation: 99 %
O2 Saturation: 98 %
Patient temperature: 38
Patient temperature: 37.5
Patient temperature: 37.9
Patient temperature: 38.1
Patient temperature: 37.6
Potassium: 4.2 mmol/L (ref 3.5–5.1)
Potassium: 4.1 mmol/L (ref 3.5–5.1)
Potassium: 4.2 mmol/L (ref 3.5–5.1)
Potassium: 4.2 mmol/L (ref 3.5–5.1)
Potassium: 4.1 mmol/L (ref 3.5–5.1)
Sodium: 142 mmol/L (ref 135–145)
Sodium: 142 mmol/L (ref 135–145)
Sodium: 142 mmol/L (ref 135–145)
Sodium: 143 mmol/L (ref 135–145)
Sodium: 143 mmol/L (ref 135–145)
TCO2: 29 mmol/L (ref 22–32)
TCO2: 27 mmol/L (ref 22–32)
TCO2: 28 mmol/L (ref 22–32)
TCO2: 28 mmol/L (ref 22–32)
TCO2: 27 mmol/L (ref 22–32)
pCO2 arterial: 57.4 mmHg — ABNORMAL HIGH (ref 32.0–48.0)
pCO2 arterial: 43.3 mmHg (ref 32.0–48.0)
pCO2 arterial: 46.3 mmHg (ref 32.0–48.0)
pCO2 arterial: 48 mmHg (ref 32.0–48.0)
pCO2 arterial: 49.3 mmHg — ABNORMAL HIGH (ref 32.0–48.0)
pH, Arterial: 7.293 — ABNORMAL LOW (ref 7.350–7.450)
pH, Arterial: 7.352 (ref 7.350–7.450)
pH, Arterial: 7.357 (ref 7.350–7.450)
pH, Arterial: 7.403 (ref 7.350–7.450)
pH, Arterial: 7.334 — ABNORMAL LOW (ref 7.350–7.450)
pO2, Arterial: 169 mmHg — ABNORMAL HIGH (ref 83.0–108.0)
pO2, Arterial: 137 mmHg — ABNORMAL HIGH (ref 83.0–108.0)
pO2, Arterial: 154 mmHg — ABNORMAL HIGH (ref 83.0–108.0)
pO2, Arterial: 159 mmHg — ABNORMAL HIGH (ref 83.0–108.0)
pO2, Arterial: 112 mmHg — ABNORMAL HIGH (ref 83.0–108.0)

## 2020-10-28 LAB — PREPARE FRESH FROZEN PLASMA
Unit division: 0
Unit division: 0

## 2020-10-28 LAB — BASIC METABOLIC PANEL
Anion gap: 10 (ref 5–15)
Anion gap: 8 (ref 5–15)
BUN: 9 mg/dL (ref 8–23)
BUN: 11 mg/dL (ref 8–23)
CO2: 24 mmol/L (ref 22–32)
CO2: 24 mmol/L (ref 22–32)
Calcium: 8.3 mg/dL — ABNORMAL LOW (ref 8.9–10.3)
Calcium: 8.6 mg/dL — ABNORMAL LOW (ref 8.9–10.3)
Chloride: 106 mmol/L (ref 98–111)
Chloride: 106 mmol/L (ref 98–111)
Creatinine, Ser: 0.96 mg/dL (ref 0.61–1.24)
Creatinine, Ser: 0.86 mg/dL (ref 0.61–1.24)
GFR, Estimated: >60 mL/min (ref >60)
GFR, Estimated: >60 mL/min (ref >60)
Glucose, Bld: 122 mg/dL — ABNORMAL HIGH (ref 70–99)
Glucose, Bld: 133 mg/dL — ABNORMAL HIGH (ref 70–99)
Potassium: 4.3 mmol/L (ref 3.5–5.1)
Potassium: 3.9 mmol/L (ref 3.5–5.1)
Sodium: 140 mmol/L (ref 135–145)
Sodium: 138 mmol/L (ref 135–145)

## 2020-10-28 LAB — BPAM FFP
Blood Product Expiration Date: 202112042359
Blood Product Expiration Date: 202112042359
ISSUE DATE / TIME: 202111291318
ISSUE DATE / TIME: 202111291318
Unit Type and Rh: 8400
Unit Type and Rh: 8400

## 2020-10-28 LAB — GLUCOSE, CAPILLARY
Glucose-Capillary: 108 mg/dL — ABNORMAL HIGH (ref 70–99)
Glucose-Capillary: 110 mg/dL — ABNORMAL HIGH (ref 70–99)
Glucose-Capillary: 116 mg/dL — ABNORMAL HIGH (ref 70–99)
Glucose-Capillary: 125 mg/dL — ABNORMAL HIGH (ref 70–99)
Glucose-Capillary: 129 mg/dL — ABNORMAL HIGH (ref 70–99)
Glucose-Capillary: 129 mg/dL — ABNORMAL HIGH (ref 70–99)
Glucose-Capillary: 130 mg/dL — ABNORMAL HIGH (ref 70–99)
Glucose-Capillary: 133 mg/dL — ABNORMAL HIGH (ref 70–99)
Glucose-Capillary: 137 mg/dL — ABNORMAL HIGH (ref 70–99)
Glucose-Capillary: 137 mg/dL — ABNORMAL HIGH (ref 70–99)
Glucose-Capillary: 152 mg/dL — ABNORMAL HIGH (ref 70–99)
Glucose-Capillary: 154 mg/dL — ABNORMAL HIGH (ref 70–99)
Glucose-Capillary: 188 mg/dL — ABNORMAL HIGH (ref 70–99)
Glucose-Capillary: 98 mg/dL (ref 70–99)

## 2020-10-28 LAB — CBC
HCT: 31.8 % — ABNORMAL LOW (ref 39.0–52.0)
HCT: 31 % — ABNORMAL LOW (ref 39.0–52.0)
Hemoglobin: 10.2 g/dL — ABNORMAL LOW (ref 13.0–17.0)
Hemoglobin: 10.4 g/dL — ABNORMAL LOW (ref 13.0–17.0)
MCH: 30.6 pg (ref 26.0–34.0)
MCH: 31.5 pg (ref 26.0–34.0)
MCHC: 32.1 g/dL (ref 30.0–36.0)
MCHC: 33.5 g/dL (ref 30.0–36.0)
MCV: 95.5 fL (ref 80.0–100.0)
MCV: 93.9 fL (ref 80.0–100.0)
Platelets: 105 10*3/uL — ABNORMAL LOW (ref 150–400)
Platelets: 93 10*3/uL — ABNORMAL LOW (ref 150–400)
RBC: 3.33 MIL/uL — ABNORMAL LOW (ref 4.22–5.81)
RBC: 3.3 MIL/uL — ABNORMAL LOW (ref 4.22–5.81)
RDW: 13.9 % (ref 11.5–15.5)
RDW: 14.3 % (ref 11.5–15.5)
WBC: 10.3 10*3/uL (ref 4.0–10.5)
WBC: 8.3 10*3/uL (ref 4.0–10.5)
nRBC: 0 % (ref 0.0–0.2)
nRBC: 0 % (ref 0.0–0.2)

## 2020-10-28 LAB — MAGNESIUM
Magnesium: 2.6 mg/dL — ABNORMAL HIGH (ref 1.7–2.4)
Magnesium: 2.3 mg/dL (ref 1.7–2.4)

## 2020-10-28 LAB — PREPARE PLATELET PHERESIS: Unit division: 0

## 2020-10-28 LAB — TYPE AND SCREEN
ABO/RH(D): AB POS
Antibody Screen: NEGATIVE

## 2020-10-28 LAB — BPAM PLATELET PHERESIS
Blood Product Expiration Date: 202111292359
ISSUE DATE / TIME: 202111291146
Unit Type and Rh: 6200

## 2020-10-28 MED ORDER — SODIUM CHLORIDE 0.9% FLUSH
10.0000 mL | Freq: Two times a day (BID) | INTRAVENOUS | Status: DC
  Administered 2020-10-28 (×3): 10 mL

## 2020-10-28 MED ORDER — PYRIDOSTIGMINE BROMIDE 60 MG PO TABS
60.0000 mg | ORAL_TABLET | Freq: Three times a day (TID) | ORAL | Status: DC
  Administered 2020-10-28 – 2020-10-31 (×10): 60 mg via ORAL
  Filled 2020-10-28 (×13): qty 1

## 2020-10-28 MED ORDER — INSULIN ASPART 100 UNIT/ML ~~LOC~~ SOLN
0.0000 [IU] | SUBCUTANEOUS | Status: DC
  Administered 2020-10-28 (×2): 2 [IU] via SUBCUTANEOUS

## 2020-10-28 MED ORDER — INSULIN DETEMIR 100 UNIT/ML ~~LOC~~ SOLN: 30.0000 [IU] | Freq: Every day | SUBCUTANEOUS | Status: DC

## 2020-10-28 MED ORDER — INSULIN DETEMIR 100 UNIT/ML ~~LOC~~ SOLN
30.0000 [IU] | Freq: Every day | SUBCUTANEOUS | Status: DC
  Administered 2020-10-28: 30 [IU] via SUBCUTANEOUS
  Filled 2020-10-28 (×2): qty 0.3

## 2020-10-28 MED ORDER — ORAL CARE MOUTH RINSE
15.0000 mL | Freq: Two times a day (BID) | OROMUCOSAL | Status: DC
  Administered 2020-10-28 – 2020-10-30 (×4): 15 mL via OROMUCOSAL

## 2020-10-28 MED ORDER — FUROSEMIDE 10 MG/ML IJ SOLN
40.0000 mg | Freq: Once | INTRAMUSCULAR | Status: AC
  Administered 2020-10-28: 40 mg via INTRAVENOUS
  Filled 2020-10-28: qty 4

## 2020-10-28 MED ORDER — SODIUM CHLORIDE 0.9% FLUSH: 10.0000 mL | INTRAVENOUS | Status: DC | PRN

## 2020-10-28 MED ORDER — MIDODRINE HCL 5 MG PO TABS
10.0000 mg | ORAL_TABLET | Freq: Three times a day (TID) | ORAL | Status: DC
  Administered 2020-10-28 – 2020-10-29 (×6): 10 mg via ORAL
  Filled 2020-10-28 (×6): qty 2

## 2020-10-28 MED FILL — Lidocaine HCl Local Soln Prefilled Syringe 100 MG/5ML (2%): INTRAMUSCULAR | Qty: 5 | Status: AC

## 2020-10-28 MED FILL — Heparin Sodium (Porcine) Inj 1000 Unit/ML: INTRAMUSCULAR | Qty: 10 | Status: AC

## 2020-10-28 MED FILL — Sodium Chloride IV Soln 0.9%: INTRAVENOUS | Qty: 2000 | Status: AC

## 2020-10-28 MED FILL — Electrolyte-R (PH 7.4) Solution: INTRAVENOUS | Qty: 5000 | Status: AC

## 2020-10-28 MED FILL — Heparin Sodium (Porcine) Inj 1000 Unit/ML: INTRAMUSCULAR | Qty: 30 | Status: AC

## 2020-10-28 MED FILL — Thrombin (Recombinant) For Soln 20000 Unit: CUTANEOUS | Qty: 1 | Status: AC

## 2020-10-28 MED FILL — Sodium Bicarbonate IV Soln 8.4%: INTRAVENOUS | Qty: 50 | Status: AC

## 2020-10-28 MED FILL — Mannitol IV Soln 20%: INTRAVENOUS | Qty: 500 | Status: AC

## 2020-10-28 MED FILL — Potassium Chloride Inj 2 mEq/ML: INTRAVENOUS | Qty: 40 | Status: AC

## 2020-10-28 MED FILL — Magnesium Sulfate Inj 50%: INTRAMUSCULAR | Qty: 10 | Status: AC

## 2020-10-28 NOTE — Discharge Summary (Signed)
Physician Discharge Summary  Patient ID: PINCHOS TOPEL MRN: [id number] DOB/AGE: Feb 10, 1956 64 y.o.  Admit date: 10/27/2020 Discharge date: 10/31/2020  Admission Diagnoses: Multivessel coronary artery disease Chest pain Type 2 diabetes mellitus Hyperlipidemia Hypothyroidism (acquired) History of gastroesophageal reflux disease History of ocular myasthenia  Discharge Diagnoses:   Multivessel coronary artery disease Chest pain Type 2 diabetes mellitus Hyperlipidemia Hypothyroidism (acquired) History of gastroesophageal reflux disease History of ocular myasthenia S/P CABG x 4 Postoperative atrial fibrillation Expected acute blood loss anemia  Discharged Condition: good  History of Present Illness:  The patient is a 64 year old gentleman with a history of hyperlipidemia, type 2 diabetes, and remote smoking who recently fell off his [place] injuring left side of his chest.  His chest pain was felt to be musculoskeletal but he had an abnormal ECG.  A previous stress echocardiogram in 2019 was normal.  He had a gated cardiac CT with coronary CTA and fractional flow reserve performed on 09/29/2020.  This showed moderate to severe multivessel coronary disease with distal RCA occlusion and left-to-right collaterals.  The FFR study showed significant LAD stenosis with an FFR of 0.79 in the mid LAD.  There was a sharp drop off after the first diagonal branch.  The left circumflex had a distal FFR of 0.51.  The RCA was occluded distally.  He subsequently underwent cardiac catheterization on 10/08/2020 which showed severely diseased RCA that was occluded distally with filling of the posterior descending and posterior lateral branches by collaterals from the left circumflex.  The LAD had 70% proximal to mid stenosis with 60% stenosis of the first diagonal branch at its origin.  The left circumflex had 90% mid vessel stenosis with 90% stenosis of the first marginal branch coming off the same area.   Left ventricular ejection fraction was estimated 45% with mild global hypokinesis.  LVEDP was low.  This 64 year old diabetic gentleman has severe three-vessel coronary disease with mild left ventricular systolic dysfunction and mild anginal symptoms consisting of left arm aching pain with exertion.  He is still quite active.  I think revascularization would be the best treatment for him since he has a large myocardial territory at risk with high-grade left circumflex stenosis compromising the lateral wall as well as the inferior wall through collaterals to the distal RCA.  His left circumflex and LAD stenoses are at bifurcations and I think percutaneous intervention would be higher risk due to the possibility of occluding blood supply to the diagonal and obtuse marginal branches.  I think his best long-term prognosis will be with coronary bypass graft surgery.  I reviewed the cardiac catheterization films with the patient and his wife answered their questions. His upper extremity arterial dopplers show decrease > 50% with left radial compression so can't use that vessel. Right arm is ulnar dependent so will put arterial line on that side. They are in agreement with proceeding with coronary bypass surgery. I discussed the operative procedure with the patient and his wife including alternatives, benefits and risks; including but not limited to bleeding, blood transfusion, infection, stroke, myocardial infarction, graft failure, heart block requiring a permanent pacemaker, organ dysfunction, and death.  Genene Churn Barocio understands and agrees to proceed.   Hospital Course:   Mr. Justo was admitted to the hospital for elective surgery on 10/27/20 and taken to the OR where CABG x 4 was carried out without complication. He was transfused with 1 platelet pheresis at the conclusion of the operation for perioperative coagulopathy. Following the procedure, he  was taken to the ICU in stable condition where he  remained hemodynamically stable n low-dose NeoSynephrine. Marland Kitchen   He was weaned from the mechanical ventilator and successfully extubated during the early morning hours of the first postoperative day. He developed atrial fibrillation early post-op and was started on an IV amiodarone load. He quickly reverted back to normal sinus rhythm and the amiodarone was converted to PO on the first post-op day. His glucose was monitored and sliding scale insulin along with long-acting insulin were provided as required. The CXR obtained on post-op day 1 showed a small right pneumothorax and since his respiratory status was stable the decision was made to repeat the CXR later in the day and hopefully avoid placing a right pleural tube. Midodrine was added for known chronically low BP and H/O orthostatic hypotension.  Follow up CXR showed stable appearance of pneumothorax.  He was maintaining Sinus Bradycardia and was therefore not started on a beta blocker.  He was medically stable for transfer to the progressive care unit on 10/29/2020.  The patient remains bradycardic.  He has not been started on a BB due to this.  Follow up CXR shows stable appearance of a right sided pneumothorax.  The patient also has a small left sided pleural effusion.  He has expected post operative thrombocytopenia.  This remained stable with most recent level being 108.  The patient is ambulating independently.  His surgical incisions are healing without evidence of infection.  He is medically stable for discharge home today.    Significant Diagnostic Studies:   LEFT HEART CATH AND CORONARY ANGIOGRAPHY  Conclusion    There is mild left ventricular systolic dysfunction.  LV end diastolic pressure is normal.  There is no aortic valve stenosis.  Prox RCA lesion is 95% stenosed. Prox RCA to Mid RCA lesion is 50% stenosed. Mid RCA lesion is 70% stenosed. Mid RCA to Dist RCA lesion is 100% stenosed. (Fills via left-right collaterals from  LCx)  Ost LM to Ost LAD lesion is 20% stenosed with 30% stenosed side branch in Ost Cx.  Ost LAD to Prox LAD lesion is 30% stenosed.  Mid Cx lesion is 90% stenosed with 90% stenosed side branch in 1st Mrg.  Mid Cx to Dist Cx lesion is 70% stenosed with 95% stenosed side branch in 2nd Mrg.  Prox LAD to Mid LAD lesion is 70% stenosed with 60% stenosed side branch in 1st Diag.   SUMMARY  Severe three-vessel disease:  ? Extensive proximal to mid 90 having 70 and 80% stenosis followed by 100% CTO (fills via left to right collaterals from AVG LCx);  ? LEFT MAIN diffuse 10 to 20% stenosis, 20% ostial LCx with 90% focal concentric stenosis at OM1 (1st Mrg) followed by a second 70% stenosis at a very small caliber OM 2,  ? Segmental calcified 40% proximal LAD with DFR positive long segment of 60-65% at the takeoff of D1 (DFR was 0.85, CT FFR was 0.79).  LAD after D1 (1st Diag) bifurcates more distally into tandem vessels of D2 and basely the septal LAD-is relatively small caliber  Mildly reduced LVEF with mild global hypokinesis EF roughly 45% by visual estimate.  Low LVEDP  Treatments:   CARDIOVASCULAR SURGERY OPERATIVE NOTE  10/27/2020  Surgeon:  [name], MD  First Assistant:  Jillyn Hidden,  PA-C   Preoperative Diagnosis:  Severe multi-vessel coronary artery disease   Postoperative Diagnosis:  Same   Procedure:  1. Median Sternotomy 2. Extracorporeal circulation  3.   Coronary artery bypass grafting x 4   Left internal mammary artery graft to the second diagonal branch of the LAD  SVG to diagonal 1  SVG to OM1  SVG to PDA 4.   Endoscopic vein harvest from the right leg  Discharge Exam: Blood pressure 125/90, pulse (!) 57, temperature 98.4 F (36.9 C), temperature source Oral, resp. rate 14, height  (1.676 m), weight 89.6 kg, SpO2 97 %.  General appearance: alert and cooperative Neurologic: intact Heart: regular rate and rhythm, S1,  S2 normal, no murmur Lungs: clear to auscultation bilaterally Extremities: mild edema Wound: incision ok  Discharge disposition: 01-Home or Self Care   Allergies as of 10/31/2020      Reactions   Codeine Itching   Hydrocodone-acetaminophen Hives, Itching   Lipitor [atorvastatin] Other (See Comments)   Aches and pains   Latex Itching      Medication List    STOP taking these medications   metoprolol succinate 25 MG 24 hr tablet Commonly known as: Toprol XL   metoprolol tartrate 100 MG tablet Commonly known as: LOPRESSOR     TAKE these medications   acetaminophen 500 MG tablet Commonly known as: TYLENOL Take 500-1,000 mg by mouth every 6 (six) hours as needed (for pain.).   albuterol 108 (90 Base) MCG/ACT inhaler Commonly known as: VENTOLIN HFA Inhale into the lungs every 6 (six) hours as needed for wheezing or shortness of breath.   amiodarone 200 MG tablet Commonly known as: PACERONE Take 1 tablet (200 mg total) by mouth 2 (two) times daily. X 7 days, then decrease to 200 mg daily   Artificial Tears PF 0.1-0.3 % Soln Generic drug: Dextran 70-Hypromellose (PF) Place 1 drop into both eyes 3 (three) times daily as needed (dry/irritated eyes.).   aspirin 325 MG EC tablet Take 1 tablet (325 mg total) by mouth daily. What changed:   medication strength  how much to take  when to take this   Fish Oil Ultra 1400 MG Caps Take 1,400 mg by mouth in the morning and at bedtime.   furosemide 40 MG tablet Commonly known as: LASIX Take 1 tablet (40 mg total) by mouth daily. Start taking on: November 01, 2020   Jardiance 10 MG Tabs tablet Generic drug: empagliflozin Take 10 mg by mouth daily.   levothyroxine 125 MCG tablet Commonly known as: SYNTHROID Take 125 mcg by mouth daily before breakfast.   metFORMIN 1000 MG tablet Commonly known as: GLUCOPHAGE Take 1,000 mg by mouth 2 (two) times daily with a meal.   oxyCODONE 5 MG immediate release tablet Commonly  known as: Oxy IR/ROXICODONE Take 1 tablet (5 mg total) by mouth every 4 (four) hours as needed for severe pain.   Ozempic (0.25 or 0.5 MG/DOSE) 2 MG/1.5ML Sopn Generic drug: Semaglutide(0.25 or 0.5MG /DOS) Inject 0.5 mg into the skin every Wednesday.   pantoprazole 40 MG tablet Commonly known as: PROTONIX Take 40 mg by mouth daily.   PARoxetine 40 MG tablet Commonly known as: PAXIL Take 40 mg by mouth daily.   potassium chloride SA 20 MEQ tablet Commonly known as: KLOR-CON Take 1 tablet (20 mEq total) by mouth daily.   pyridostigmine 60 MG tablet Commonly known as: MESTINON Take 60 mg by mouth in the morning, at noon, and at bedtime.   rosuvastatin 10 MG tablet Commonly known as: CRESTOR Take 1 tablet (10 mg total) by mouth 3 (three) times a week. What changed:   when to take  this  additional instructions       Follow-up Information    Baldo Daub, MD. Go on 11/17/2020.   Specialty: Cardiology Why: Your appointment with Dr. Dulce Sellar is on Monday, 11/17/2020 at 3 PM. Contact information: [address] [place] [address] [phone]        [facility]. Go on 11/07/2020.   Specialty: Cardiothoracic Surgery Why: Your appointment for suture removal is on Friday, 11/07/2020 at 11 AM Contact information: [address] [place], [address] [place] [address] [phone]       [name], MD. Go on 12/03/2020.   Specialty: Cardiothoracic Surgery Why: Your follow-up appointment with Dr. Lavinia Sharps is on Wednesday, 12/03/2020 at 12:30 PM Contact information: [address] [place] [address] [phone]              The patient has been discharged on:   1.Beta Blocker:  Yes [   ]                              No   [ x  ]                              If No, reason: bradycardia, hypotension  2.Ace Inhibitor/ARB: Yes [   ]                                     No  [ x   ]                                      If No, reason: hypotension  3.Statin:   Yes [x   ]                  No  [   ]                  If No, reason:  4.Ecasa:  Yes  [x   ]                  No   [   ]                  If No, reason:     Signed: Arvon Schreiner, PA-C 10/31/2020, 10:21 AM

## 2020-10-28 NOTE — Plan of Care (Signed)
  Problem: Education: Goal: Knowledge of General Education information will improve Description: Including pain rating scale, medication(s)/side effects and non-pharmacologic comfort measures Outcome: Progressing   Problem: Health Behavior/Discharge Planning: Goal: Ability to manage health-related needs will improve Outcome: Progressing   Problem: Clinical Measurements: Goal: Ability to maintain clinical measurements within normal limits will improve Outcome: Progressing   Problem: Clinical Measurements: Goal: Will remain free from infection Outcome: Progressing   Problem: Clinical Measurements: Goal: Cardiovascular complication will be avoided Outcome: Progressing   Problem: Nutrition: Goal: Adequate nutrition will be maintained Outcome: Not Applicable   Problem: Elimination: Goal: Will not experience complications related to bowel motility Outcome: Progressing Goal: Will not experience complications related to urinary retention Outcome: Progressing   Problem: Nutrition: Goal: Adequate nutrition will be maintained Outcome: Not Applicable

## 2020-10-28 NOTE — Progress Notes (Signed)
Anesthesiology Follow-up:  Awake and alert, sitting in char, neuro intact, taking PO liquids well.  VS: T- 36.5 BP- 110/70- HR-90 a-paced  (sinus bradycardia underneath) O2 Sat 98% on 1L [place]   K-4.3 Na-140 BUN/Cr.-90.96 glucose- 122 H/H- 10.2/31.8 Platelets- 105,00  Extubated at 02:45 this morning, 12 hours post-op   64 year old male one day S/P CABG X 4, patient ocular myasthenia on mestinon which may have contributed to his prolonged weaning post-op. Now don well without apparent complications.  Kipp Brood

## 2020-10-28 NOTE — Discharge Instructions (Signed)

## 2020-10-28 NOTE — Procedures (Signed)
Arterial Catheter Insertion Procedure Note  Derek Moore  [id number]  [birth year]  Date:10/28/20  Time:4:52 AM    Provider Performing: Augustina Mood    Procedure: Insertion of Arterial Line (88828) with US guidance (00349)   Indication(s) Blood pressure monitoring and/or need for frequent ABGs  Consent Risks of the procedure as well as the alternatives and risks of each were explained to the patient and/or caregiver.  Consent for the procedure was obtained and is signed in the bedside chart  Anesthesia None   Time Out Verified patient identification, verified procedure, site/side was marked, verified correct patient position, special equipment/implants available, medications/allergies/relevant history reviewed, required imaging and test results available.   Sterile Technique Maximal sterile technique including full sterile barrier drape, hand hygiene, sterile gown, sterile gloves, mask, hair covering, sterile ultrasound probe cover (if used).   Procedure Description Area of catheter insertion was cleaned with chlorhexidine and draped in sterile fashion. Without real-time ultrasound guidance an arterial catheter was placed into the left radial artery.  Appropriate arterial tracings confirmed on monitor.     Complications/Tolerance None; patient tolerated the procedure well.   EBL    Specimen(s) None

## 2020-10-28 NOTE — Procedures (Signed)
Extubation Procedure Note  Patient Details:   Name: HANDY MCLOUD DOB: 08-Jan-1956 MRN: [id number]   Airway Documentation:    Vent end date: 10/28/20 Vent end time: 0245   Evaluation  O2 sats: stable throughout Complications: No apparent complications Patient did tolerate procedure well. Bilateral Breath Sounds: Clear, Diminished   Yes  Suctioned pt mouth and airway. Normal ABG VC 1.3L NIF-25. No complications noted. Extubated to 4L [place].  Ulice Brilliant 10/28/2020, 2:51 AM

## 2020-10-28 NOTE — Progress Notes (Signed)
Patient ID: RANDON SOMERA, male   DOB: 1956/03/02, 64 y.o.   MRN: [id number] TCTS Evening Rounds:  Hemodynamically stable off neo.  AV paced on IV amio.  Feels well.  Pm labs pending  Will start diuresis.

## 2020-10-28 NOTE — Progress Notes (Signed)
1 Day Post-Op Procedure(s) (LRB): CORONARY ARTERY BYPASS GRAFTING (CABG), ON PUMP, TIMES FOUR, USING LEFT INTERNAL MAMMARY ARTERY AND ENDOSCOPICALLY HARVESTED RIGHT GREATER SAPHENOUS VEIN (N/A) TRANSESOPHAGEAL ECHOCARDIOGRAM (TEE) (N/A) Subjective: Sore  Objective: Vital signs in last 24 hours: Temp:  [96.1 F (35.6 C)-101.1 F (38.4 C)] 99.7 F (37.6 C) (11/30 0508) Pulse Rate:  [35-95] 89 (11/30 0508) Cardiac Rhythm: Atrial paced;Ventricular paced (11/29 1943) Resp:  [12-27] 24 (11/30 0508) BP: (73-127)/(50-91) 86/61 (11/30 0500) SpO2:  [92 %-100 %] 100 % (11/30 0508) Arterial Line BP: (72-158)/(28-80) 131/49 (11/30 0508) FiO2 (%):  [40 %-50 %] 40 % (11/30 0140) Weight:  [92.7 kg] 92.7 kg (11/30 0508)  Hemodynamic parameters for last 24 hours: PAP: (23-299)/(11-291) 25/13 CO:  [3.7 L/min-6.5 L/min] 5.6 L/min CI:  [1.9 L/min/m2-3.3 L/min/m2] 2.9 L/min/m2  Intake/Output from previous day: 11/29 0701 - 11/30 0700 In: 7397.7 [I.V.:3553.3; OEVOJ:5009; IV Piggyback:2447.4] Out: 3222 [Urine:1873; Blood:739; Chest Tube:610] Intake/Output this shift: Total I/O In: 1339.8 [I.V.:1037.5; IV Piggyback:302.3] Out: 923 [Urine:643; Chest Tube:280]  General appearance: alert and cooperative Neurologic: intact Heart: regular rate and rhythm, S1, S2 normal, no murmur Lungs: clear to auscultation bilaterally Abdomen: soft, non-tender; bowel sounds normal Extremities: edema mild Wound: dressing dry  Lab Results: Recent Labs    10/27/20 2053 10/27/20 2054 10/28/20 0347 10/28/20 0511  WBC 9.1  --   --  10.3  HGB 10.7*   < > 10.2* 10.2*  HCT 32.2*   < > 30.0* 31.8*  PLT 106*  --   --  105*   < > = values in this interval not displayed.   BMET:  Recent Labs    10/27/20 2053 10/27/20 2054 10/28/20 0347 10/28/20 0511  NA 139   < > 143 140  K 4.3   < > 4.1 4.3  CL 109  --   --  106  CO2 24  --   --  24  GLUCOSE 133*  --   --  122*  BUN 10  --   --  9  CREATININE 0.94  --    --  0.96  CALCIUM 8.0*  --   --  8.3*   < > = values in this interval not displayed.    PT/INR:  Recent Labs    10/27/20 1427  LABPROT 16.2*  INR 1.4*   ABG    Component Value Date/Time   PHART 7.334 (L) 10/28/2020 0347   HCO3 26.0 10/28/2020 0347   TCO2 27 10/28/2020 0347   ACIDBASEDEF 2.0 10/27/2020 1759   O2SAT 98.0 10/28/2020 0347   CBG (last 3)  Recent Labs    10/28/20 0227 10/28/20 0345 10/28/20 0446  GLUCAP 154* 108* 116*   CXR: moderate right PTX  Assessment/Plan: S/P Procedure(s) (LRB): CORONARY ARTERY BYPASS GRAFTING (CABG), ON PUMP, TIMES FOUR, USING LEFT INTERNAL MAMMARY ARTERY AND ENDOSCOPICALLY HARVESTED RIGHT GREATER SAPHENOUS VEIN (N/A) TRANSESOPHAGEAL ECHOCARDIOGRAM (TEE) (N/A)  POD 1 Hemodynamically stable in sinus brady rhythm on IV amio for early postop atrial fib. Will switch to po. Still on some neo to support BP. Wean as tolerated. His wife said he runs a low BP and has some orthostasis at times so will start midodrine for a few days.  Volume excess: Wt is 13 lbs over preop. Diurese once neo weaned.  DM: glucose under control on insulin drip. Preop Hgb A1c was 7.1 on multiple meds. Will start Levemir and SSI.  Ocular myasthenia: resume Mestinon.  Right ptx. Repeat CXR at 4 pm.  If it does not enlarge will hold off on chest tube since it is small to moderate and he is doing well from pulmonary standpoint.  IS, OOB.  DC chest tubes, swan.   LOS: 1 day    [name] 10/28/2020

## 2020-10-29 ENCOUNTER — Ambulatory Visit: Payer: Commercial Managed Care - PPO | Admitting: Cardiology

## 2020-10-29 ENCOUNTER — Inpatient Hospital Stay (HOSPITAL_COMMUNITY): Payer: Commercial Managed Care - PPO

## 2020-10-29 LAB — CBC
HCT: 31.5 % — ABNORMAL LOW (ref 39.0–52.0)
Hemoglobin: 10.2 g/dL — ABNORMAL LOW (ref 13.0–17.0)
MCH: 30.7 pg (ref 26.0–34.0)
MCHC: 32.4 g/dL (ref 30.0–36.0)
MCV: 94.9 fL (ref 80.0–100.0)
Platelets: 87 10*3/uL — ABNORMAL LOW (ref 150–400)
RBC: 3.32 MIL/uL — ABNORMAL LOW (ref 4.22–5.81)
RDW: 14 % (ref 11.5–15.5)
WBC: 9.2 10*3/uL (ref 4.0–10.5)
nRBC: 0 % (ref 0.0–0.2)

## 2020-10-29 LAB — BASIC METABOLIC PANEL
Anion gap: 9 (ref 5–15)
BUN: 12 mg/dL (ref 8–23)
CO2: 26 mmol/L (ref 22–32)
Calcium: 8.9 mg/dL (ref 8.9–10.3)
Chloride: 105 mmol/L (ref 98–111)
Creatinine, Ser: 0.87 mg/dL (ref 0.61–1.24)
GFR, Estimated: >60 mL/min (ref >60)
Glucose, Bld: 107 mg/dL — ABNORMAL HIGH (ref 70–99)
Potassium: 3.6 mmol/L (ref 3.5–5.1)
Sodium: 140 mmol/L (ref 135–145)

## 2020-10-29 LAB — GLUCOSE, CAPILLARY
Glucose-Capillary: 133 mg/dL — ABNORMAL HIGH (ref 70–99)
Glucose-Capillary: 161 mg/dL — ABNORMAL HIGH (ref 70–99)
Glucose-Capillary: 171 mg/dL — ABNORMAL HIGH (ref 70–99)
Glucose-Capillary: 78 mg/dL (ref 70–99)
Glucose-Capillary: 87 mg/dL (ref 70–99)
Glucose-Capillary: 95 mg/dL (ref 70–99)

## 2020-10-29 MED ORDER — MORPHINE SULFATE (PF) 2 MG/ML IV SOLN: 1.0000 mg | INTRAVENOUS | Status: DC | PRN

## 2020-10-29 MED ORDER — ROSUVASTATIN CALCIUM 5 MG PO TABS
10.0000 mg | ORAL_TABLET | ORAL | Status: DC
  Administered 2020-10-31: 10 mg via ORAL
  Filled 2020-10-29: qty 2

## 2020-10-29 MED ORDER — SODIUM CHLORIDE 0.9% FLUSH: 3.0000 mL | INTRAVENOUS | Status: DC | PRN

## 2020-10-29 MED ORDER — SODIUM CHLORIDE 0.9 % IV SOLN: 250.0000 mL | INTRAVENOUS | Status: DC | PRN

## 2020-10-29 MED ORDER — ONDANSETRON HCL 4 MG/2ML IJ SOLN: 4.0000 mg | Freq: Four times a day (QID) | INTRAMUSCULAR | Status: DC | PRN

## 2020-10-29 MED ORDER — AMIODARONE HCL 200 MG PO TABS
200.0000 mg | ORAL_TABLET | Freq: Two times a day (BID) | ORAL | Status: DC
  Administered 2020-10-29 – 2020-10-31 (×5): 200 mg via ORAL
  Filled 2020-10-29 (×5): qty 1

## 2020-10-29 MED ORDER — SENNOSIDES-DOCUSATE SODIUM 8.6-50 MG PO TABS: 1.0000 | ORAL_TABLET | Freq: Two times a day (BID) | ORAL | Status: DC | PRN

## 2020-10-29 MED ORDER — PANTOPRAZOLE SODIUM 40 MG PO TBEC
40.0000 mg | DELAYED_RELEASE_TABLET | Freq: Every day | ORAL | Status: DC
  Administered 2020-10-30 – 2020-10-31 (×2): 40 mg via ORAL
  Filled 2020-10-29 (×2): qty 1

## 2020-10-29 MED ORDER — INSULIN ASPART 100 UNIT/ML ~~LOC~~ SOLN
0.0000 [IU] | Freq: Three times a day (TID) | SUBCUTANEOUS | Status: DC
  Administered 2020-10-29: 2 [IU] via SUBCUTANEOUS
  Administered 2020-10-29 – 2020-10-30 (×3): 4 [IU] via SUBCUTANEOUS
  Administered 2020-10-30 (×2): 2 [IU] via SUBCUTANEOUS
  Administered 2020-10-31: 4 [IU] via SUBCUTANEOUS

## 2020-10-29 MED ORDER — INSULIN DETEMIR 100 UNIT/ML ~~LOC~~ SOLN
25.0000 [IU] | Freq: Every day | SUBCUTANEOUS | Status: DC
  Administered 2020-10-29 – 2020-10-31 (×3): 25 [IU] via SUBCUTANEOUS
  Filled 2020-10-29 (×5): qty 0.25

## 2020-10-29 MED ORDER — LEVALBUTEROL HCL 0.63 MG/3ML IN NEBU: 0.6300 mg | INHALATION_SOLUTION | Freq: Four times a day (QID) | RESPIRATORY_TRACT | Status: DC | PRN

## 2020-10-29 MED ORDER — ASPIRIN EC 325 MG PO TBEC
325.0000 mg | DELAYED_RELEASE_TABLET | Freq: Every day | ORAL | Status: DC
  Administered 2020-10-30 – 2020-10-31 (×2): 325 mg via ORAL
  Filled 2020-10-29 (×2): qty 1

## 2020-10-29 MED ORDER — FUROSEMIDE 40 MG PO TABS
40.0000 mg | ORAL_TABLET | Freq: Every day | ORAL | Status: DC
  Administered 2020-10-29 – 2020-10-31 (×3): 40 mg via ORAL
  Filled 2020-10-29 (×3): qty 1

## 2020-10-29 MED ORDER — TRAMADOL HCL 50 MG PO TABS: 50.0000 mg | ORAL_TABLET | ORAL | Status: DC | PRN

## 2020-10-29 MED ORDER — ~~LOC~~ CARDIAC SURGERY, PATIENT & FAMILY EDUCATION: Freq: Once | Status: DC

## 2020-10-29 MED ORDER — ONDANSETRON HCL 4 MG PO TABS: 4.0000 mg | ORAL_TABLET | Freq: Four times a day (QID) | ORAL | Status: DC | PRN

## 2020-10-29 MED ORDER — SODIUM CHLORIDE 0.9% FLUSH
3.0000 mL | Freq: Two times a day (BID) | INTRAVENOUS | Status: DC
  Administered 2020-10-29 – 2020-10-31 (×4): 3 mL via INTRAVENOUS

## 2020-10-29 MED ORDER — POTASSIUM CHLORIDE CRYS ER 20 MEQ PO TBCR: 20.0000 meq | EXTENDED_RELEASE_TABLET | Freq: Two times a day (BID) | ORAL | Status: DC

## 2020-10-29 MED ORDER — PAROXETINE HCL 20 MG PO TABS
40.0000 mg | ORAL_TABLET | Freq: Every day | ORAL | Status: DC
  Administered 2020-10-29 – 2020-10-31 (×3): 40 mg via ORAL
  Filled 2020-10-29 (×4): qty 2

## 2020-10-29 MED ORDER — MIDODRINE HCL 5 MG PO TABS
5.0000 mg | ORAL_TABLET | Freq: Three times a day (TID) | ORAL | Status: DC
  Administered 2020-10-30 (×3): 5 mg via ORAL
  Filled 2020-10-29 (×3): qty 1

## 2020-10-29 MED ORDER — POTASSIUM CHLORIDE CRYS ER 20 MEQ PO TBCR
20.0000 meq | EXTENDED_RELEASE_TABLET | Freq: Two times a day (BID) | ORAL | Status: DC
  Administered 2020-10-30 – 2020-10-31 (×3): 20 meq via ORAL
  Filled 2020-10-29 (×3): qty 1

## 2020-10-29 MED ORDER — ACETAMINOPHEN 325 MG PO TABS: 650.0000 mg | ORAL_TABLET | Freq: Four times a day (QID) | ORAL | Status: DC | PRN

## 2020-10-29 MED ORDER — POTASSIUM CHLORIDE CRYS ER 20 MEQ PO TBCR
20.0000 meq | EXTENDED_RELEASE_TABLET | ORAL | Status: AC
  Administered 2020-10-29 (×3): 20 meq via ORAL
  Filled 2020-10-29 (×3): qty 1

## 2020-10-29 NOTE — Anesthesia Postprocedure Evaluation (Signed)
Anesthesia Post Note  Patient: Derek Moore  Procedure(s) Performed: CORONARY ARTERY BYPASS GRAFTING (CABG), ON PUMP, TIMES FOUR, USING LEFT INTERNAL MAMMARY ARTERY AND ENDOSCOPICALLY HARVESTED RIGHT GREATER SAPHENOUS VEIN (N/A Chest) TRANSESOPHAGEAL ECHOCARDIOGRAM (TEE) (N/A )     Patient location during evaluation: SICU Anesthesia Type: General Level of consciousness: awake and alert and patient cooperative Pain management: satisfactory to patient Vital Signs Assessment: post-procedure vital signs reviewed and stable Respiratory status: spontaneous breathing, nonlabored ventilation and patient connected to nasal cannula oxygen Cardiovascular status: blood pressure returned to baseline Postop Assessment: no apparent nausea or vomiting Anesthetic complications: no   No complications documented.  Last Vitals:  Vitals:   10/29/20 0400 10/29/20 0500  BP: 98/66 99/67  Pulse: 89 89  Resp: 16 17  Temp:    SpO2: 97% 98%    Last Pain:  Vitals:   10/29/20 0400  TempSrc:   PainSc: 3                  Kadence Mikkelson COKER

## 2020-10-29 NOTE — Progress Notes (Signed)
Mobility Specialist: Progress Note   10/29/20 1604  Mobility  Activity Ambulated in hall  Level of Assistance Modified independent, requires aide device or extra time  Assistive Device Front wheel walker  Distance Ambulated (ft) 250 ft  Mobility Response Tolerated well  Mobility performed by Mobility specialist  $Mobility charge 1 Mobility   Pre-Mobility: 80 HR Post-Mobility: 80 HR, 113/72 BP, 100% SpO2  Pt was asx during ambulation. Pt to BR after returning to room per request, NT notified.   [facility] Perri Lamagna Mobility Specialist

## 2020-10-29 NOTE — Progress Notes (Signed)
CARDIAC REHAB PHASE I   Offered to walk with pt. Pt getting IJ d/c'd. Pt states he ambulated 136ft this am. Encouraged continued walks, sternal precautions and IS. Pt and wife in agreement. Will continue to follow.  [phone] Reynold Bowen, RN BSN 10/29/2020 2:38 PM

## 2020-10-29 NOTE — Progress Notes (Signed)
2 Days Post-Op Procedure(s) (LRB): CORONARY ARTERY BYPASS GRAFTING (CABG), ON PUMP, TIMES FOUR, USING LEFT INTERNAL MAMMARY ARTERY AND ENDOSCOPICALLY HARVESTED RIGHT GREATER SAPHENOUS VEIN (N/A) TRANSESOPHAGEAL ECHOCARDIOGRAM (TEE) (N/A) Subjective: No complaints  Objective: Vital signs in last 24 hours: Temp:  [98.1 F (36.7 C)-98.8 F (37.1 C)] 98.5 F (36.9 C) (12/01 0358) Pulse Rate:  [78-90] 80 (12/01 0800) Cardiac Rhythm: Normal sinus rhythm (12/01 0400) Resp:  [12-36] 17 (12/01 0800) BP: (90-116)/(54-78) 102/57 (12/01 0800) SpO2:  [84 %-100 %] 96 % (12/01 0800) Arterial Line BP: (95-131)/(38-66) 104/38 (12/01 0800) Weight:  [92.1 kg] 92.1 kg (12/01 0500)  Hemodynamic parameters for last 24 hours:    Intake/Output from previous day: 11/30 0701 - 12/01 0700 In: 1647.9 [P.O.:300; I.V.:1147.8; IV Piggyback:200.1] Out: 2270 [Urine:2260; Chest Tube:10] Intake/Output this shift: Total I/O In: 36.7 [I.V.:36.7] Out: -   General appearance: alert and cooperative Neurologic: intact Heart: regular rate and rhythm, S1, S2 normal, no murmur Lungs: clear to auscultation bilaterally Extremities: edema moderate Wound: dressing dry  Lab Results: Recent Labs    10/28/20 1607 10/29/20 0406  WBC 8.3 9.2  HGB 10.4* 10.2*  HCT 31.0* 31.5*  PLT 93* 87*   BMET:  Recent Labs    10/28/20 1607 10/29/20 0406  NA 138 140  K 3.9 3.6  CL 106 105  CO2 24 26  GLUCOSE 133* 107*  BUN 11 12  CREATININE 0.86 0.87  CALCIUM 8.6* 8.9    PT/INR:  Recent Labs    10/27/20 1427  LABPROT 16.2*  INR 1.4*   ABG    Component Value Date/Time   PHART 7.334 (L) 10/28/2020 0347   HCO3 26.0 10/28/2020 0347   TCO2 27 10/28/2020 0347   ACIDBASEDEF 2.0 10/27/2020 1759   O2SAT 98.0 10/28/2020 0347   CBG (last 3)  Recent Labs    10/29/20 0003 10/29/20 0356 10/29/20 0625  GLUCAP 78 87 95   CXR: stable small right ptx. Lungs clear otherwise with no effusion  Assessment/Plan: S/P  Procedure(s) (LRB): CORONARY ARTERY BYPASS GRAFTING (CABG), ON PUMP, TIMES FOUR, USING LEFT INTERNAL MAMMARY ARTERY AND ENDOSCOPICALLY HARVESTED RIGHT GREATER SAPHENOUS VEIN (N/A) TRANSESOPHAGEAL ECHOCARDIOGRAM (TEE) (N/A)  POD 2 Hemodynamically stable off neo.  Rhythm under pacer is sinus brady 50. Will continue atrial pacing and switch amio to 200 bid po. Hold off on beta blocker.  Volume excess: diuresed well yesterday. Will continue po lasix. Wt still 12 lbs over preop.  DM: glucose under good control on Levemir and SSI.  Ocular myasthenia: continue mestinon.  DC arterial line, sleeve, foley. Transfer to 4E and continue mobilization.   LOS: 2 days    [name] 10/29/2020

## 2020-10-29 NOTE — Progress Notes (Signed)
Pt arrived from unit from 2 heart. VSS.Will continue to monitor pt. Pt A- pacing @ 80. CHG bath given . Oriented to unit   Everlean Cherry, RN

## 2020-10-30 ENCOUNTER — Inpatient Hospital Stay (HOSPITAL_COMMUNITY): Payer: Commercial Managed Care - PPO

## 2020-10-30 LAB — BASIC METABOLIC PANEL
Anion gap: 9 (ref 5–15)
BUN: 14 mg/dL (ref 8–23)
CO2: 25 mmol/L (ref 22–32)
Calcium: 8.9 mg/dL (ref 8.9–10.3)
Chloride: 105 mmol/L (ref 98–111)
Creatinine, Ser: 0.94 mg/dL (ref 0.61–1.24)
GFR, Estimated: >60 mL/min (ref >60)
Glucose, Bld: 119 mg/dL — ABNORMAL HIGH (ref 70–99)
Potassium: 3.6 mmol/L (ref 3.5–5.1)
Sodium: 139 mmol/L (ref 135–145)

## 2020-10-30 LAB — GLUCOSE, CAPILLARY
Glucose-Capillary: 123 mg/dL — ABNORMAL HIGH (ref 70–99)
Glucose-Capillary: 148 mg/dL — ABNORMAL HIGH (ref 70–99)
Glucose-Capillary: 123 mg/dL — ABNORMAL HIGH (ref 70–99)
Glucose-Capillary: 195 mg/dL — ABNORMAL HIGH (ref 70–99)

## 2020-10-30 LAB — CBC
HCT: 30.7 % — ABNORMAL LOW (ref 39.0–52.0)
Hemoglobin: 10.4 g/dL — ABNORMAL LOW (ref 13.0–17.0)
MCH: 31.1 pg (ref 26.0–34.0)
MCHC: 33.9 g/dL (ref 30.0–36.0)
MCV: 91.9 fL (ref 80.0–100.0)
Platelets: 89 10*3/uL — ABNORMAL LOW (ref 150–400)
RBC: 3.34 MIL/uL — ABNORMAL LOW (ref 4.22–5.81)
RDW: 13.3 % (ref 11.5–15.5)
WBC: 7.9 10*3/uL (ref 4.0–10.5)
nRBC: 0 % (ref 0.0–0.2)

## 2020-10-30 MED ORDER — ASPIRIN 325 MG PO TBEC
325.0000 mg | DELAYED_RELEASE_TABLET | Freq: Every day | ORAL | 0 refills | Status: DC
Start: 2020-10-31 — End: 2021-01-08

## 2020-10-30 NOTE — Progress Notes (Addendum)
      [address]       [place] [address]             [phone]      3 Days Post-Op Procedure(s) (LRB): CORONARY ARTERY BYPASS GRAFTING (CABG), ON PUMP, TIMES FOUR, USING LEFT INTERNAL MAMMARY ARTERY AND ENDOSCOPICALLY HARVESTED RIGHT GREATER SAPHENOUS VEIN (N/A) TRANSESOPHAGEAL ECHOCARDIOGRAM (TEE) (N/A)   Subjective:  Patient up getting ready to the bathroom.  He has no complaints and is hoping to go home tomorrow.  + ambulation  + BM  Objective: Vital signs in last 24 hours: Temp:  [98.1 F (36.7 C)-98.7 F (37.1 C)] 98.5 F (36.9 C) (12/02 0603) Pulse Rate:  [80] 80 (12/02 0603) Cardiac Rhythm: Atrial paced (12/01 1900) Resp:  [14-19] 15 (12/02 0603) BP: (102-146)/(55-83) 119/71 (12/02 0603) SpO2:  [96 %-100 %] 99 % (12/02 0603) Arterial Line BP: (104-120)/(38-48) 120/48 (12/01 0900) Weight:  [90 kg] 90 kg (12/02 0500)  Intake/Output from previous day: 12/01 0701 - 12/02 0700 In: 105.7 [I.V.:105.7] Out: 77 [Urine:76; Stool:1]  General appearance: alert, cooperative and no distress Heart: regular rate and rhythm and paced Lungs: diminished breath sounds bibasilar Abdomen: soft, non-tender; bowel sounds normal; no masses,  no organomegaly Extremities: edema trace Wound: clean and dry  Lab Results: Recent Labs    10/29/20 0406 10/30/20 0358  WBC 9.2 7.9  HGB 10.2* 10.4*  HCT 31.5* 30.7*  PLT 87* 89*   BMET:  Recent Labs    10/29/20 0406 10/30/20 0358  NA 140 139  K 3.6 3.6  CL 105 105  CO2 26 25  GLUCOSE 107* 119*  BUN 12 14  CREATININE 0.87 0.94  CALCIUM 8.9 8.9    PT/INR:  Recent Labs    10/27/20 1427  LABPROT 16.2*  INR 1.4*   ABG    Component Value Date/Time   PHART 7.334 (L) 10/28/2020 0347   HCO3 26.0 10/28/2020 0347   TCO2 27 10/28/2020 0347   ACIDBASEDEF 2.0 10/27/2020 1759   O2SAT 98.0 10/28/2020 0347   CBG (last 3)  Recent Labs    10/29/20 1647 10/29/20 2139 10/30/20 0609  GLUCAP 171* 161* 123*     Assessment/Plan: S/P Procedure(s) (LRB): CORONARY ARTERY BYPASS GRAFTING (CABG), ON PUMP, TIMES FOUR, USING LEFT INTERNAL MAMMARY ARTERY AND ENDOSCOPICALLY HARVESTED RIGHT GREATER SAPHENOUS VEIN (N/A) TRANSESOPHAGEAL ECHOCARDIOGRAM (TEE) (N/A)  1. CV- Sinus Bradycardia under pacer- on Amiodarone 200 mg BID, no BB currently, may be able to take off pacer today and see how he does 2. Pulm- no acute issues, CXR with stable appearance of right pneumothorax, small left pleural effusion 3. Renal- creatinine WNL, weight is trending down, continue Lasix, potassim 4. Expected post operative blood loss anemia, mild Hgb at 10.4 5. Expected post operative thrombocytopenia- stable at 89 6. Ocular myasthenia- continue mestinon 7. Dispo- patient stable, remains bradycardic on Amiodarone, continue to hold BB, diurese, watch platelet count   LOS: 3 days    Lowella Dandy, PA-C 10/30/2020   Chart reviewed, patient examined, agree with above. He says he feels well and wants to go home tomorrow. Pacer turned off and rhythm is sinus brady 58.  His baseline rhythm preop was sinus brady in the 50's so this should be ok. Will continue amio 200 bid for now and no beta blocker.  If rhythm stable today will remove pacer wires in am.  Continue diuresis. CXR shows stable small right ptx.

## 2020-10-30 NOTE — Progress Notes (Signed)
CARDIAC REHAB PHASE I   PRE:  Rate/Rhythm: 65 SR  BP:  Sitting: 102/58      SaO2: 95 RA  MODE:  Ambulation: 300 ft   POST:  Rate/Rhythm: 71 SR  BP:  Sitting: 109/59    SaO2: 94 RA   Pt ambulated 337ft in hallway standby assist with front wheel walker. Pt states minimal SOB, but denies CP or dizziness. Pt returned to recliner, demonstrating 1250 on IS. Encouraged continued IS use and walks. Call bell and bedside table within reach. Will continue to follow.  [id number] Reynold Bowen, RN BSN 10/30/2020 8:54 AM

## 2020-10-30 NOTE — Progress Notes (Signed)
Mobility Specialist: Progress Note   10/30/20 1145  Mobility  Activity Ambulated in hall  Level of Assistance Modified independent, requires aide device or extra time  Assistive Device Front wheel walker  Distance Ambulated (ft) 440 ft  Mobility Response Tolerated well  Mobility performed by Mobility specialist  Bed Position Chair  $Mobility charge 1 Mobility   Pre-Mobility: 67 HR, 99% SpO2 Post-Mobility: 66 HR, 115/66 BP  Pt c/o feeling dizzy upon standing, resolved quickly. Pt had no c/o during ambulation.  [facility] Tammy Wickliffe Mobility Specialist

## 2020-10-31 ENCOUNTER — Inpatient Hospital Stay (HOSPITAL_COMMUNITY): Payer: Commercial Managed Care - PPO

## 2020-10-31 DIAGNOSIS — J189 Pneumonia, unspecified organism: Secondary | ICD-10-CM | POA: Insufficient documentation

## 2020-10-31 LAB — GLUCOSE, CAPILLARY
Glucose-Capillary: 106 mg/dL — ABNORMAL HIGH (ref 70–99)
Glucose-Capillary: 186 mg/dL — ABNORMAL HIGH (ref 70–99)

## 2020-10-31 LAB — BASIC METABOLIC PANEL
Anion gap: 8 (ref 5–15)
BUN: 16 mg/dL (ref 8–23)
CO2: 27 mmol/L (ref 22–32)
Calcium: 9.2 mg/dL (ref 8.9–10.3)
Chloride: 108 mmol/L (ref 98–111)
Creatinine, Ser: 0.98 mg/dL (ref 0.61–1.24)
GFR, Estimated: >60 mL/min (ref >60)
Glucose, Bld: 78 mg/dL (ref 70–99)
Potassium: 3.4 mmol/L — ABNORMAL LOW (ref 3.5–5.1)
Sodium: 143 mmol/L (ref 135–145)

## 2020-10-31 LAB — CBC
HCT: 29 % — ABNORMAL LOW (ref 39.0–52.0)
Hemoglobin: 10.1 g/dL — ABNORMAL LOW (ref 13.0–17.0)
MCH: 31.9 pg (ref 26.0–34.0)
MCHC: 34.8 g/dL (ref 30.0–36.0)
MCV: 91.5 fL (ref 80.0–100.0)
Platelets: 108 10*3/uL — ABNORMAL LOW (ref 150–400)
RBC: 3.17 MIL/uL — ABNORMAL LOW (ref 4.22–5.81)
RDW: 13.4 % (ref 11.5–15.5)
WBC: 6.8 10*3/uL (ref 4.0–10.5)
nRBC: 0 % (ref 0.0–0.2)

## 2020-10-31 MED ORDER — POTASSIUM CHLORIDE CRYS ER 20 MEQ PO TBCR
40.0000 meq | EXTENDED_RELEASE_TABLET | Freq: Once | ORAL | Status: AC
  Administered 2020-10-31: 40 meq via ORAL
  Filled 2020-10-31: qty 2

## 2020-10-31 MED ORDER — FUROSEMIDE 40 MG PO TABS
40.0000 mg | ORAL_TABLET | Freq: Every day | ORAL | 0 refills | Status: DC
Start: 2020-11-01 — End: 2020-11-11

## 2020-10-31 MED ORDER — AMIODARONE HCL 200 MG PO TABS
200.0000 mg | ORAL_TABLET | Freq: Two times a day (BID) | ORAL | 1 refills | Status: DC
Start: 2020-10-31 — End: 2020-12-19

## 2020-10-31 MED ORDER — POTASSIUM CHLORIDE CRYS ER 20 MEQ PO TBCR
20.0000 meq | EXTENDED_RELEASE_TABLET | Freq: Every day | ORAL | 0 refills | Status: DC
Start: 2020-10-31 — End: 2020-11-11

## 2020-10-31 MED ORDER — OXYCODONE HCL 5 MG PO TABS: 5.0000 mg | ORAL_TABLET | ORAL | 0 refills | Status: DC | PRN

## 2020-10-31 NOTE — Progress Notes (Signed)
4 Days Post-Op Procedure(s) (LRB): CORONARY ARTERY BYPASS GRAFTING (CABG), ON PUMP, TIMES FOUR, USING LEFT INTERNAL MAMMARY ARTERY AND ENDOSCOPICALLY HARVESTED RIGHT GREATER SAPHENOUS VEIN (N/A) TRANSESOPHAGEAL ECHOCARDIOGRAM (TEE) (N/A) Subjective:  No complaints. Feels fine and wants to go home.   Objective: Vital signs in last 24 hours: Temp:  [97.7 F (36.5 C)-98.6 F (37 C)] 98.4 F (36.9 C) (12/03 0406) Pulse Rate:  [59-80] 60 (12/02 2359) Cardiac Rhythm: Normal sinus rhythm;Sinus bradycardia;Atrial flutter (12/03 0400) Resp:  [16-19] 19 (12/03 0406) BP: (84-105)/(49-64) 105/64 (12/03 0406) SpO2:  [94 %-100 %] 94 % (12/03 0406) Weight:  [89.6 kg] 89.6 kg (12/03 0548)  Hemodynamic parameters for last 24 hours:    Intake/Output from previous day: No intake/output data recorded. Intake/Output this shift: No intake/output data recorded.  General appearance: alert and cooperative Neurologic: intact Heart: regular rate and rhythm, S1, S2 normal, no murmur Lungs: clear to auscultation bilaterally Extremities: mild edema Wound: incision ok  Lab Results: Recent Labs    10/30/20 0358 10/31/20 0132  WBC 7.9 6.8  HGB 10.4* 10.1*  HCT 30.7* 29.0*  PLT 89* 108*   BMET:  Recent Labs    10/30/20 0358 10/31/20 0132  NA 139 143  K 3.6 3.4*  CL 105 108  CO2 25 27  GLUCOSE 119* 78  BUN 14 16  CREATININE 0.94 0.98  CALCIUM 8.9 9.2    PT/INR: No results for input(s): LABPROT, INR in the last 72 hours. ABG    Component Value Date/Time   PHART 7.334 (L) 10/28/2020 0347   HCO3 26.0 10/28/2020 0347   TCO2 27 10/28/2020 0347   ACIDBASEDEF 2.0 10/27/2020 1759   O2SAT 98.0 10/28/2020 0347   CBG (last 3)  Recent Labs    10/30/20 1728 10/30/20 2147 10/31/20 0602  GLUCAP 123* 195* 106*    Assessment/Plan: S/P Procedure(s) (LRB): CORONARY ARTERY BYPASS GRAFTING (CABG), ON PUMP, TIMES FOUR, USING LEFT INTERNAL MAMMARY ARTERY AND ENDOSCOPICALLY HARVESTED RIGHT  GREATER SAPHENOUS VEIN (N/A) TRANSESOPHAGEAL ECHOCARDIOGRAM (TEE) (N/A)  POD 4 Hemodynamically stable in sinus 58-60 which is his baseline. Will DC pacing wires. No beta blocker.  Mild volume excess: wt is coming down but 6.6 lbs over preop. Will continue lasix and Kdur for another week.  DM: glucose under control. Continue home regimen.  Plan discharge later this am after pacing wire removal.   LOS: 4 days    [name] 10/31/2020

## 2020-10-31 NOTE — Progress Notes (Signed)
Mobility Specialist: Progress Note   10/31/20 1118  Mobility  Activity Refused mobility   Pt refused mobility stating he is discharging soon.   [facility] Almer Bushey Mobility Specialist

## 2020-10-31 NOTE — Progress Notes (Signed)
CARDIAC REHAB PHASE I   D/c education completed with pt and wife. Pt educated on importance of site care and monitoring incision daily. Encouraged continued IS use, walks, and sternal precautions. Pt given in-the-tube sheet along with heart healthy and diabetic diets. Reviewed restrictions and exercise guidelines. Will refer to CRP II [place].  [id number] Reynold Bowen, RN BSN 10/31/2020 10:34 AM

## 2020-10-31 NOTE — Progress Notes (Signed)
Epicardial pacing wires discontinued as per orders.  Pt tolerated well.  1 hour bedrest up at 0853. Frequent vitals cycling. Will continue to monitor.

## 2020-11-03 ENCOUNTER — Telehealth: Payer: Self-pay

## 2020-11-03 ENCOUNTER — Other Ambulatory Visit: Payer: Self-pay

## 2020-11-03 ENCOUNTER — Ambulatory Visit
Admission: RE | Admit: 2020-11-03 | Discharge: 2020-11-03 | Disposition: A | Discharge status: Home / Self Care | Payer: Commercial Managed Care - PPO | Source: Ambulatory Visit | Attending: Surgery | Admitting: Surgery

## 2020-11-03 DIAGNOSIS — R0602 Shortness of breath: Secondary | ICD-10-CM

## 2020-11-03 DIAGNOSIS — Z951 Presence of aortocoronary bypass graft: Secondary | ICD-10-CM

## 2020-11-03 NOTE — Telephone Encounter (Signed)
[name], Derek Moore contacted the office with concerns about her husband and shortness of breath.  Patient is s/p CABG with Dr. Laneta Simmers 10/27/20 and was discharged home 10/31/2020.  She states that he gets short of breath when he lays flat, mostly at night.  He is coughing a lot per patient.  He is currently taking 40 mg Lasix daily for fluid retention and has been urinating appropriately.  He is down 10 pounds since before surgery and he does not have any problems with lower extremity swelling.  Advised patient to get a chest xray, downstairs, first floor of [facility].  Patient waited in waiting room as Jari Favre, PA looked over chest xray, advised that it does not look like there has been much of a change, maybe even slight improvement.  Advised patient to sleep in recliner until small bilateral plural resolve, continue to use incentive spirometer, and walk as tolerated.  He acknowledged receipt. Patient is aware of follow-up appointments.

## 2020-11-06 DIAGNOSIS — Z8709 Personal history of other diseases of the respiratory system: Secondary | ICD-10-CM | POA: Insufficient documentation

## 2020-11-06 HISTORY — DX: Personal history of other diseases of the respiratory system: Z87.09

## 2020-11-07 ENCOUNTER — Other Ambulatory Visit: Payer: Self-pay

## 2020-11-07 ENCOUNTER — Ambulatory Visit (INDEPENDENT_AMBULATORY_CARE_PROVIDER_SITE_OTHER): Payer: Self-pay | Admitting: *Deleted

## 2020-11-07 DIAGNOSIS — Z951 Presence of aortocoronary bypass graft: Secondary | ICD-10-CM

## 2020-11-07 DIAGNOSIS — Z4802 Encounter for removal of sutures: Secondary | ICD-10-CM

## 2020-11-07 NOTE — Progress Notes (Signed)
Patient arrived for nurse visit to remove sutures post-CABG by Dr. Laneta Simmers 10/27/20.  Sutures removed with no signs or symptoms of infection noted.  Incisions well approximated.  Patient tolerated suture removal well.  Patient and family instructed to keep the incision site clean and dry. Patient and family acknowledged instructions given.  All questions answered.

## 2020-11-10 NOTE — Progress Notes (Addendum)
Cardiology Office Note:    Date:  11/11/2020   ID:  Derek Moore, Wilson 11-17-1956, MRN [id number]  PCP:  Gordan Payment., MD  Cardiologist:  Norman Herrlich, MD    Referring MD: Gordan Payment., MD   11/11/2020: Both Dr.Tobb and I reviewed the intraoperative TEE.  Images the left atrial appendage are limited and was not formally evaluated and conclusion is that there is a probable left atrial thrombus.  I advised the family that he should have a TEE done if thrombus is confirmed he requires lifelong anticoagulation and I am uncomfortable committing him in the absence of it with brief atrial fibrillation perioperatively.  I will call the patient and his wife and speak to them today  ASSESSMENT:    1. PAF (paroxysmal atrial fibrillation) (HCC)   2. On amiodarone therapy   3. Coronary artery disease of native artery of native heart with stable angina pectoris (HCC)   4. Pure hypercholesterolemia   5. Type 2 diabetes mellitus without complication, with long-term current use of insulin (HCC)   6. Mixed hyperlipidemia    PLAN:    In order of problems listed above:  1. Sinus rhythm we will apply a 7-day ZIO if no atrial fibrillation discontinue amiodarone 2. Stable CAD after bypass surgery awaiting first postoperative visit. 3. Discontinue statin with myasthenia gravis to see if his muscle weakness improves readdress the issue next visit regarding PCSK9 inhibitor 4. Stable diabetes treated by his PCP 5. Shortness of breath appears to be multifactorial part of this may be LV dysfunction heart failure resume a diuretic proBNP and echocardiogram   Next appointment: 3 weeks   Medication Adjustments/Labs and Tests Ordered: Current medicines are reviewed at length with the patient today.  Concerns regarding medicines are outlined above.  No orders of the defined types were placed in this encounter.  No orders of the defined types were placed in this encounter.   Chief Complaint   Patient presents with  . Follow-up    After CABG    History of Present Illness:    Derek Moore is a 64 y.o. male with a hx of chest pain hyperlipidemia and coronary artery calcification    last seen 10/02/2020.He was referred for cardiac CTA which showed a very high calcium score 739- 88th percentile for age and sex he had moderate to severe multivessel CAD with 1 to 24% left main coronary artery 50 to 69% left anterior descending coronary artery stenosis proximal and mid vessel 25 to 49% left circumflex coronary artery and 50 to 69% right coronary artery stenosis.  Subsequent fractional flow reserve showed diminished flow with hemodynamic stenosis in the left anterior descending coronary artery and left circumflex coronary artery.  He subsequent underwent elective coronary artery bypass surgery 10/27/2020 with a left thoracic artery anastomosis to left anterior descending and 3 vein grafts.  He developed early atrial fibrillation was loaded with IV amiodarone and resumed sinus rhythm.  He had a small pneumothorax and chest x-ray with stable appearance did not require chest tube.  The rhythm was sinus bradycardia and he was not placed on a beta-blocker.  Compliance with diet, lifestyle and medications: Yes  Left heart cath 10/08/2020:  Conclusion   There is mild left ventricular systolic dysfunction.  LV end diastolic pressure is normal.  There is no aortic valve stenosis.  Prox RCA lesion is 95% stenosed. Prox RCA to Mid RCA lesion is 50% stenosed. Mid RCA lesion is 70% stenosed. Mid RCA  to Dist RCA lesion is 100% stenosed. (Fills via left-right collaterals from LCx)  Ost LM to Ost LAD lesion is 20% stenosed with 30% stenosed side branch in Ost Cx.  Ost LAD to Prox LAD lesion is 30% stenosed.  Mid Cx lesion is 90% stenosed with 90% stenosed side branch in 1st Mrg.  Mid Cx to Dist Cx lesion is 70% stenosed with 95% stenosed side branch in 2nd Mrg.  Prox LAD to Mid LAD lesion  is 70% stenosed with 60% stenosed side branch in 1st Diag.   SUMMARY  Severe three-vessel disease:  ? Extensive proximal to mid 90 having 70 and 80% stenosis followed by 100% CTO (fills via left to right collaterals from AVG LCx);  ? LEFT MAIN diffuse 10 to 20% stenosis, 20% ostial LCx with 90% focal concentric stenosis at OM1 (1st Mrg) followed by a second 70% stenosis at a very small caliber OM 2,  ? Segmental calcified 40% proximal LAD with DFR positive long segment of 60-65% at the takeoff of D1 (DFR was 0.85, CT FFR was 0.79).  LAD after D1 (1st Diag) bifurcates more distally into tandem vessels of D2 and basely the septal LAD-is relatively small caliber  Mildly reduced LVEF with mild global hypokinesis EF roughly 45% by visual estimate.  Low LVEDP     RECOMMENDATION  He is relatively asymptomatic, recommend outpatient CVTS consultation -> need to consider the benefits of full revascularization with CABG versus PCI of the bifurcation LCx-OM1 lesion and bifurcation LAD-D1 lesion.  No antiplatelet agent besides aspirin with planned CVTS consultation  He had a chest x-ray performed 11/03/2020 for shortness of breath this shows residual small right apical pneumothorax decreased in size and small bilateral pleural effusions.  Has struggled post bypass he has had shortness of breath treated for asthma and has increased neuromuscular weakness due to myasthenia. He is on a statin will going to discontinue and likely need a PCSK9 inhibitor. He came home on a diuretic and was doing well he has edema may have heart failure and will do a proBNP level and I placed him back on diuretic therapy and echocardiogram to reassess ejection fraction that was mildly reduced prior to bypass surgery. No fever chills or wound drainage he has no symptoms of atrial fibrillation his wife reviewed his hospital records and my chart alerts me that a TEE says that he had a left atrial appendage thrombus and I will asked  Dr. Carlena Sax to review the images and to let me know if the thrombus is present he needs to be anticoagulated. Scheduled for postoperative for surgical visit. Past Medical History:  Diagnosis Date  . Abnormal EKG 03/09/2018  . Acquired hypothyroidism 03/07/2018  . Allergy   . Aortic atherosclerosis (HCC) 07/17/2020   Formatting of this note might be different from the original. Seen on [facility] 06/2019  . Arthritis   . Asthma   . Cataract   . Chronic gout without tophus 03/07/2018  . Coronary artery calcification seen on CAT scan 07/17/2020   Formatting of this note might be different from the original. 06/2019. [facility]  . Depression 03/07/2018  . Diplopia 10/11/2018  . DISH (diffuse idiopathic skeletal hyperostosis) 07/17/2020   Formatting of this note might be different from the original. Seen on [facility] 06/2019  . Dizzy 05/09/2018  . Gastroesophageal reflux disease without esophagitis 03/07/2018   Formatting of this note might be different from the original. Long term  . GERD (gastroesophageal reflux disease) 03/07/2018  .  Gout 03/07/2018  . Heel spur, left 02/07/2020  . Hyperlipidemia 03/07/2018  . Hypothyroidism (acquired) 03/07/2018   Formatting of this note might be different from the original. 1990s.  . Mixed hyperlipidemia 03/07/2018  . Ocular myasthenia gravis (HCC) 07/17/2020   Formatting of this note might be different from the original. Ocular.  Follows at [facility].  Sees neurology and eye doctor.  . Palpitation 03/08/2018  . Plantar fasciitis 02/07/2020  . Pneumonia   . Primary osteoarthritis involving multiple joints 07/17/2020  . Smokeless tobacco use 07/17/2020   Formatting of this note might be different from the original. Discussed chantix.  He wants to use. Did well in past. Off label, he understands.  . Tightness of heel cord, left 02/07/2020  . Type 2 diabetes mellitus (HCC) 03/07/2018  . Type 2 diabetes mellitus without complication, without long-term current use of insulin (HCC) 03/07/2018   Formatting of  this note might be different from the original. 2014.    Past Surgical History:  Procedure Laterality Date  . CATARACT EXTRACTION W/ INTRAOCULAR LENS  IMPLANT, BILATERAL    . COLONOSCOPY W/ POLYPECTOMY    . CORONARY ARTERY BYPASS GRAFT N/A 10/27/2020   Procedure: CORONARY ARTERY BYPASS GRAFTING (CABG), ON PUMP, TIMES FOUR, USING LEFT INTERNAL MAMMARY ARTERY AND ENDOSCOPICALLY HARVESTED RIGHT GREATER SAPHENOUS VEIN;  Surgeon: [name], MD;  Location: [facility] OR;  Service: Open Heart Surgery;  Laterality: N/A;  . detatched retina    . kidney stone extraction     cystoscopy x 2  . KNEE SURGERY Left    bone spurs  . LEFT HEART CATH AND CORONARY ANGIOGRAPHY N/A 10/08/2020   Procedure: LEFT HEART CATH AND CORONARY ANGIOGRAPHY;  Surgeon: Marykay Lex, MD;  Location: [facility] INVASIVE CV LAB;  Service: Cardiovascular;  Laterality: N/A;  . NOSE SURGERY    . TEE WITHOUT CARDIOVERSION N/A 10/27/2020   Procedure: TRANSESOPHAGEAL ECHOCARDIOGRAM (TEE);  Surgeon: [name], MD;  Location: [facility] OR;  Service: Open Heart Surgery;  Laterality: N/A;    Current Medications: Current Meds  Medication Sig  . acetaminophen (TYLENOL) 500 MG tablet Take 500-1,000 mg by mouth every 6 (six) hours as needed (for pain.).  Marland Kitchen albuterol (PROVENTIL) (2.5 MG/3ML) 0.083% nebulizer solution Take 2.5 mg by nebulization every 6 (six) hours as needed.  Marland Kitchen albuterol (VENTOLIN HFA) 108 (90 Base) MCG/ACT inhaler Inhale into the lungs every 6 (six) hours as needed for wheezing or shortness of breath.  Marland Kitchen amiodarone (PACERONE) 200 MG tablet Take 1 tablet (200 mg total) by mouth 2 (two) times daily. X 7 days, then decrease to 200 mg daily  . aspirin EC 325 MG EC tablet Take 1 tablet (325 mg total) by mouth daily.  Marland Kitchen Dextran 70-Hypromellose, PF, (ARTIFICIAL TEARS PF) 0.1-0.3 % SOLN Place 1 drop into both eyes 3 (three) times daily as needed (dry/irritated eyes.).  Marland Kitchen empagliflozin (JARDIANCE) 10 MG TABS tablet Take 10 mg by mouth  daily.  Marland Kitchen levothyroxine (SYNTHROID) 125 MCG tablet Take 125 mcg by mouth daily before breakfast.   . metFORMIN (GLUCOPHAGE) 1000 MG tablet Take 1,000 mg by mouth 2 (two) times daily with a meal.  . Omega-3 Fatty Acids (FISH OIL ULTRA) 1400 MG CAPS Take 1,400 mg by mouth in the morning and at bedtime.  . pantoprazole (PROTONIX) 40 MG tablet Take 40 mg by mouth daily.  Marland Kitchen PARoxetine (PAXIL) 40 MG tablet Take 40 mg by mouth daily.  Marland Kitchen pyridostigmine (MESTINON) 60 MG tablet Take 60 mg  by mouth 4 (four) times daily.  . rosuvastatin (CRESTOR) 10 MG tablet Take 1 tablet (10 mg total) by mouth 3 (three) times a week. (Patient taking differently: Take 10 mg by mouth every Monday, Wednesday, and Friday at 8 PM. In the evening)  . Semaglutide,0.25 or 0.5MG /DOS, (OZEMPIC, 0.25 OR 0.5 MG/DOSE,) 2 MG/1.5ML SOPN Inject 0.5 mg into the skin every Wednesday.      Allergies:   Codeine, Hydrocodone-acetaminophen, Lipitor [atorvastatin], and Latex   Social History   Socioeconomic History  . Marital status: Married    Spouse name: Not on file  . Number of children: Not on file  . Years of education: Not on file  . Highest education level: Not on file  Occupational History  . Not on file  Tobacco Use  . Smoking status: Former Smoker    Types: Cigars, Pipe    Quit date: 11/30/1999    Years since quitting: 20.9  . Smokeless tobacco: Current User    Types: Snuff  . Tobacco comment: stopped smoking pipe tobacco 30 years ago  Vaping Use  . Vaping Use: Never used  Substance and Sexual Activity  . Alcohol use: Yes    Comment: Occasional Beer   . Drug use: Never  . Sexual activity: Not on file  Other Topics Concern  . Not on file  Social History Narrative  . Not on file   Social Determinants of Health   Financial Resource Strain: Not on file  Food Insecurity: Not on file  Transportation Needs: Not on file  Physical Activity: Not on file  Stress: Not on file  Social Connections: Not on file      Family History: The patient's family history includes Blindness in his father; CAD in his father; Cancer in his father; Heart Problems in his mother; Hypertension in his father; Rheum arthritis in his mother. There is no history of Colon cancer, Esophageal cancer, Rectal cancer, or Stomach cancer. ROS:   Please see the history of present illness.    All other systems reviewed and are negative.  EKGs/Labs/Other Studies Reviewed:    The following studies were reviewed today:  EKG:  EKG ordered today and personally reviewed.  The ekg ordered today demonstrates sinus rhythm ST-T abnormality  Recent Labs: 10/24/2020: ALT 21 10/28/2020: Magnesium 2.3 10/31/2020: BUN 16; Creatinine, Ser 0.98; Hemoglobin 10.1; Platelets 108; Potassium 3.4; Sodium 143  Recent Lipid Panel No results found for: CHOL, TRIG, HDL, CHOLHDL, VLDL, LDLCALC, LDLDIRECT  Physical Exam:    VS:  BP (!) 108/56   Pulse 63   Ht 5\' 6"  (1.676 m)   Wt 202 lb (91.6 kg)   SpO2 97%   BMI 32.60 kg/m     Wt Readings from Last 3 Encounters:  11/11/20 202 lb (91.6 kg)  10/31/20 197 lb 9.6 oz (89.6 kg)  10/24/20 195 lb 3 oz (88.5 kg)     GEN:  Well nourished, well developed in no acute distress HEENT: Normal NECK: No JVD; No carotid bruits LYMPHATICS: No lymphadenopathy CARDIAC: RRR, no murmurs, rubs, gallops RESPIRATORY:  Clear to auscultation without rales, wheezing or rhonchi  ABDOMEN: Soft, non-tender, non-distended MUSCULOSKELETAL: Lower extremity 1-2+ pitting edema; No deformity  SKIN: Warm and dry NEUROLOGIC:  Alert and oriented x 3 PSYCHIATRIC:  Normal affect    Signed, 10/26/20, MD  11/11/2020 8:33 AM    [facility]

## 2020-11-11 ENCOUNTER — Encounter: Payer: Self-pay | Admitting: Cardiology

## 2020-11-11 ENCOUNTER — Ambulatory Visit (INDEPENDENT_AMBULATORY_CARE_PROVIDER_SITE_OTHER): Payer: Commercial Managed Care - PPO

## 2020-11-11 ENCOUNTER — Ambulatory Visit: Payer: Commercial Managed Care - PPO | Admitting: Cardiology

## 2020-11-11 ENCOUNTER — Other Ambulatory Visit: Payer: Self-pay

## 2020-11-11 VITALS — BP 108/56 | HR 63 | Ht 66.0 in | Wt 202.0 lb

## 2020-11-11 DIAGNOSIS — I25118 Atherosclerotic heart disease of native coronary artery with other forms of angina pectoris: Secondary | ICD-10-CM | POA: Diagnosis not present

## 2020-11-11 DIAGNOSIS — Z794 Long term (current) use of insulin: Secondary | ICD-10-CM

## 2020-11-11 DIAGNOSIS — Z79899 Other long term (current) drug therapy: Secondary | ICD-10-CM | POA: Diagnosis not present

## 2020-11-11 DIAGNOSIS — E7800 Pure hypercholesterolemia, unspecified: Secondary | ICD-10-CM | POA: Diagnosis not present

## 2020-11-11 DIAGNOSIS — I480 Paroxysmal atrial fibrillation: Secondary | ICD-10-CM | POA: Diagnosis not present

## 2020-11-11 DIAGNOSIS — E782 Mixed hyperlipidemia: Secondary | ICD-10-CM

## 2020-11-11 DIAGNOSIS — E119 Type 2 diabetes mellitus without complications: Secondary | ICD-10-CM

## 2020-11-11 MED ORDER — FUROSEMIDE 20 MG PO TABS: 20.0000 mg | ORAL_TABLET | Freq: Every day | ORAL | 3 refills | Status: DC

## 2020-11-11 MED ORDER — POTASSIUM CHLORIDE CRYS ER 20 MEQ PO TBCR: 20.0000 meq | EXTENDED_RELEASE_TABLET | Freq: Every day | ORAL | 3 refills | Status: DC

## 2020-11-11 NOTE — Patient Instructions (Signed)
Medication Instructions:  Your physician has recommended you make the following change in your medication:  STOP: Crestor DECREASE: Lasix 20 mg take one tablet by mouth daily.  DECREASE: Potassium 20 meq take one tablet by mouth daily.  *If you need a refill on your cardiac medications before your next appointment, please call your pharmacy*   Lab Work: Your physician recommends that you return for lab work in: TODAY BMP, ProBNP, TSH, T3, T4 If you have labs (blood work) drawn today and your tests are completely normal, you will receive your results only by: Marland Kitchen MyChart Message (if you have MyChart) OR . A paper copy in the mail If you have any lab test that is abnormal or we need to change your treatment, we will call you to review the results.   Testing/Procedures: Your physician has requested that you have an echocardiogram. Echocardiography is a painless test that uses sound waves to create images of your heart. It provides your doctor with information about the size and shape of your heart and how well your heart's chambers and valves are working. This procedure takes approximately one hour. There are no restrictions for this procedure.  A zio monitor was ordered today. It will remain on for 7 days. You will then return monitor and event diary in provided box. It takes 1-2 weeks for report to be downloaded and returned to [place]. We will call you with the results. If monitor falls off or has orange flashing light, please call Zio for further instructions.      Follow-Up: At [facility], you and your health needs are our priority.  As part of our continuing mission to provide you with exceptional heart care, we have created designated Provider Care Teams.  These Care Teams include your primary Cardiologist (physician) and Advanced Practice Providers (APPs -  Physician Assistants and Nurse Practitioners) who all work together to provide you with the care you need, when you need it.  We  recommend signing up for the patient portal called "MyChart".  Sign up information is provided on this After Visit Summary.  MyChart is used to connect with patients for Virtual Visits (Telemedicine).  Patients are able to view lab/test results, encounter notes, upcoming appointments, etc.  Non-urgent messages can be sent to your provider as well.   To learn more about what you can do with MyChart, go to [email or web address].    Your next appointment:   3 week(s)  The format for your next appointment:   In Person  Provider:   Norman Herrlich, MD or Thomasene Ripple, DO   Other Instructions

## 2020-11-12 ENCOUNTER — Ambulatory Visit (INDEPENDENT_AMBULATORY_CARE_PROVIDER_SITE_OTHER): Payer: Commercial Managed Care - PPO

## 2020-11-12 ENCOUNTER — Telehealth: Payer: Self-pay | Admitting: Cardiology

## 2020-11-12 ENCOUNTER — Telehealth: Payer: Self-pay

## 2020-11-12 DIAGNOSIS — I480 Paroxysmal atrial fibrillation: Secondary | ICD-10-CM

## 2020-11-12 DIAGNOSIS — Z79899 Other long term (current) drug therapy: Secondary | ICD-10-CM

## 2020-11-12 LAB — BASIC METABOLIC PANEL
BUN/Creatinine Ratio: 14 (ref 10–24)
BUN: 18 mg/dL (ref 8–27)
CO2: 18 mmol/L — ABNORMAL LOW (ref 20–29)
Calcium: 9.5 mg/dL (ref 8.6–10.2)
Chloride: 102 mmol/L (ref 96–106)
Creatinine, Ser: 1.3 mg/dL — ABNORMAL HIGH (ref 0.76–1.27)
GFR calc Af Amer: 66 mL/min/{1.73_m2} (ref >59)
GFR calc non Af Amer: 57 mL/min/{1.73_m2} — ABNORMAL LOW (ref >59)
Glucose: 242 mg/dL — ABNORMAL HIGH (ref 65–99)
Potassium: 4.7 mmol/L (ref 3.5–5.2)
Sodium: 137 mmol/L (ref 134–144)

## 2020-11-12 LAB — TSH+T4F+T3FREE
Free T4: 1.15 ng/dL (ref 0.82–1.77)
T3, Free: 1.6 pg/mL — ABNORMAL LOW (ref 2.0–4.4)
TSH: 29.2 u[IU]/mL — ABNORMAL HIGH (ref 0.450–4.500)

## 2020-11-12 LAB — ECHOCARDIOGRAM COMPLETE
Area-P 1/2: 2.37 cm2
Calc EF: 48.1 %
S' Lateral: 3.5 cm
Single Plane A2C EF: 48 %
Single Plane A4C EF: 47.3 %

## 2020-11-12 LAB — PRO B NATRIURETIC PEPTIDE: NT-Pro BNP: 247 pg/mL (ref 0–376)

## 2020-11-12 MED ORDER — FUROSEMIDE 20 MG PO TABS: 10.0000 mg | ORAL_TABLET | Freq: Every day | ORAL | 3 refills | Status: DC

## 2020-11-12 MED ORDER — LEVOTHYROXINE SODIUM 50 MCG PO TABS: 50.0000 ug | ORAL_TABLET | Freq: Every day | ORAL | 3 refills | Status: DC

## 2020-11-12 NOTE — Telephone Encounter (Signed)
The patient has been notified of the result and verbalized understanding.  All questions (if any) were answered. Sampson Goon, RN 11/12/2020 2:20 PM   "Good result his ejection fraction remains mildly reduced and I suspect this will improve with time." MD [facility]

## 2020-11-12 NOTE — Telephone Encounter (Signed)
Derek Daub, MD  11/12/2020 7:37 AM EST      DM is poorly controlled, needs to see PCP  Also hypothyroidism, start 0.050 mg synthroid daily, due to amiodarone  Recheck BMP 2 weeks  Reduce furosemide to 1/2 daily after 2 days

## 2020-11-12 NOTE — Progress Notes (Signed)
Complete echocardiogram performed.  Jimmy Ammarie Matsuura RDCS, RVT  

## 2020-11-12 NOTE — Telephone Encounter (Signed)
Outreach made to Pt.  Advised to see PCP for follow up for diabetes.  Advised of increased dose of levothyroxine.  New prescription sent.  Pt will get repeat lab work in 2 weeks at [place] office.  Order placed.  Advised Pt to continue lasix 20 mg for 2 more days and then reduce to 10 mg daily.  Pt indicates understanding

## 2020-11-12 NOTE — Telephone Encounter (Signed)
Patient calling for echo results 

## 2020-11-13 ENCOUNTER — Telehealth (HOSPITAL_COMMUNITY): Payer: Self-pay

## 2020-11-13 NOTE — Telephone Encounter (Signed)
Cardiac rehab referral for Ph.II faxed to [place]. 

## 2020-11-14 ENCOUNTER — Telehealth: Payer: Self-pay | Admitting: Cardiology

## 2020-11-14 NOTE — Telephone Encounter (Signed)
[facility] [place] would like [place] to fax them the patient's recent lab results.  Please fax results to [phone]

## 2020-11-14 NOTE — Telephone Encounter (Signed)
Fax sent at this time.  

## 2020-11-17 ENCOUNTER — Ambulatory Visit: Payer: Commercial Managed Care - PPO | Admitting: Cardiology

## 2020-12-02 ENCOUNTER — Other Ambulatory Visit: Payer: Self-pay | Admitting: Surgery

## 2020-12-02 DIAGNOSIS — Z951 Presence of aortocoronary bypass graft: Secondary | ICD-10-CM

## 2020-12-03 ENCOUNTER — Ambulatory Visit
Admission: RE | Admit: 2020-12-03 | Discharge: 2020-12-03 | Disposition: A | Discharge status: Home / Self Care | Payer: Commercial Managed Care - PPO | Source: Ambulatory Visit | Attending: Surgery | Admitting: Surgery

## 2020-12-03 ENCOUNTER — Encounter: Payer: Self-pay | Admitting: Surgery

## 2020-12-03 ENCOUNTER — Ambulatory Visit (INDEPENDENT_AMBULATORY_CARE_PROVIDER_SITE_OTHER): Payer: Self-pay | Admitting: Surgery

## 2020-12-03 ENCOUNTER — Other Ambulatory Visit: Payer: Self-pay

## 2020-12-03 VITALS — BP 92/56 | HR 64 | Resp 20 | Ht 66.0 in | Wt 188.0 lb

## 2020-12-03 DIAGNOSIS — Z951 Presence of aortocoronary bypass graft: Secondary | ICD-10-CM

## 2020-12-05 ENCOUNTER — Ambulatory Visit: Payer: Commercial Managed Care - PPO | Admitting: Cardiology

## 2020-12-05 ENCOUNTER — Encounter: Payer: Self-pay | Admitting: Cardiology

## 2020-12-05 ENCOUNTER — Other Ambulatory Visit: Payer: Self-pay

## 2020-12-05 ENCOUNTER — Encounter: Payer: Self-pay | Admitting: Surgery

## 2020-12-05 VITALS — BP 110/60 | HR 65 | Ht 66.0 in | Wt 194.2 lb

## 2020-12-05 DIAGNOSIS — E669 Obesity, unspecified: Secondary | ICD-10-CM

## 2020-12-05 DIAGNOSIS — Z951 Presence of aortocoronary bypass graft: Secondary | ICD-10-CM | POA: Diagnosis not present

## 2020-12-05 DIAGNOSIS — E782 Mixed hyperlipidemia: Secondary | ICD-10-CM | POA: Diagnosis not present

## 2020-12-05 DIAGNOSIS — E119 Type 2 diabetes mellitus without complications: Secondary | ICD-10-CM

## 2020-12-05 DIAGNOSIS — I2510 Atherosclerotic heart disease of native coronary artery without angina pectoris: Secondary | ICD-10-CM | POA: Diagnosis not present

## 2020-12-05 HISTORY — DX: Obesity, unspecified: E66.9

## 2020-12-05 NOTE — Patient Instructions (Signed)
Medication Instructions:  Your physician recommends that you continue on your current medications as directed. Please refer to the Current Medication list given to you today.  *If you need a refill on your cardiac medications before your next appointment, please call your pharmacy*   Lab Work: NONE If you have labs (blood work) drawn today and your tests are completely normal, you will receive your results only by: Marland Kitchen MyChart Message (if you have MyChart) OR . A paper copy in the mail If you have any lab test that is abnormal or we need to change your treatment, we will call you to review the results.   Testing/Procedures: NONE   Follow-Up: At [facility], you and your health needs are our priority.  As part of our continuing mission to provide you with exceptional heart care, we have created designated Provider Care Teams.  These Care Teams include your primary Cardiologist (physician) and Advanced Practice Providers (APPs -  Physician Assistants and Nurse Practitioners) who all work together to provide you with the care you need, when you need it.  We recommend signing up for the patient portal called "MyChart".  Sign up information is provided on this After Visit Summary.  MyChart is used to connect with patients for Virtual Visits (Telemedicine).  Patients are able to view lab/test results, encounter notes, upcoming appointments, etc.  Non-urgent messages can be sent to your provider as well.   To learn more about what you can do with MyChart, go to [email or web address].    Your next appointment:   8 week(s)  The format for your next appointment:   In Person  Provider:   Norman Herrlich, MD   Other Instructions

## 2020-12-05 NOTE — Progress Notes (Signed)
Cardiology Office Note:    Date:  12/05/2020   ID:  Derek Moore, Derek Moore [birth year], MRN [id number]  PCP:  Raina Mina., MD  Cardiologist:  Berniece Salines, DO  Electrophysiologist:  None   Referring MD: Raina Mina., MD   I feel better  History of Present Illness:    Derek Moore is a 65 y.o. male with a hx of   chest pain hyperlipidemia and coronary artery calcification   last seen 10/02/2020.He was referred for cardiac CTA which showed a very high calcium score 739- 88th percentile for age and sex he had moderate to severe multivessel CAD with 1 to 24% left main coronary artery 64 to 69% left anterior descending coronary artery stenosis proximal and mid vessel 25 to 49% left circumflex coronary artery and 50 to 69% right coronary artery stenosis. Subsequent fractional flow reserve showed diminished flow with hemodynamic stenosis in the left anterior descending coronary artery and left circumflex coronary artery.  He subsequent underwent elective coronary artery bypass surgery 10/27/2020 with a left thoracic artery anastomosis to left anterior descending and 3 vein grafts.  He developed early atrial fibrillation was loaded with IV amiodarone and resumed sinus rhythm.  He had a small pneumothorax and chest x-ray with stable appearance did not require chest tube.  The rhythm was sinus bradycardia and he was not placed on beta blocker.   The patient was seen by Dr. Bettina Gavia on December 40 2021 at that time as your monitor was placed on the patient to understand for recurrent atrial fibrillation.  At that time it was also discussed as his intraoperative TEE reported left atrial appendage clot, after further review this was inconclusive therefore shared decision for follow-up TEE to rule out left atrial appendage clot.  At that time his statin was also stopped due to myasthenia gravis/muscle weakness.  In the interim he did see CT surgery Lasix and his potassium was stopped as  well.  Today he offers no complaints.    Past Medical History:  Diagnosis Date  . Abnormal EKG 03/09/2018  . Acquired hypothyroidism 03/07/2018  . Allergy   . Aortic atherosclerosis ([place]) 07/17/2020   Formatting of this note might be different from the original. Seen on CT [facility] 06/2019  . Arthritis   . Asthma   . Cataract   . Chronic gout without tophus 03/07/2018  . Coronary artery calcification seen on CAT scan 07/17/2020   Formatting of this note might be different from the original. 06/2019. [facility]  . Depression 03/07/2018  . Diplopia 10/11/2018  . DISH (diffuse idiopathic skeletal hyperostosis) 07/17/2020   Formatting of this note might be different from the original. Seen on CT [facility] 06/2019  . Dizzy 05/09/2018  . Gastroesophageal reflux disease without esophagitis 03/07/2018   Formatting of this note might be different from the original. Long term  . GERD (gastroesophageal reflux disease) 03/07/2018  . Gout 03/07/2018  . Heel spur, left 02/07/2020  . Hyperlipidemia 03/07/2018  . Hypothyroidism (acquired) 03/07/2018   Formatting of this note might be different from the original. 1990s.  . Mixed hyperlipidemia 03/07/2018  . Ocular myasthenia gravis ([place]) 07/17/2020   Formatting of this note might be different from the original. Ocular.  Follows at [facility].  Sees neurology and eye doctor.  . Palpitation 03/08/2018  . Plantar fasciitis 02/07/2020  . Pneumonia   . Primary osteoarthritis involving multiple joints 07/17/2020  . Smokeless tobacco use 07/17/2020   Formatting of this note might be different  from the original. Discussed chantix.  He wants to use. Did well in past. Off label, he understands.  . Tightness of heel cord, left 02/07/2020  . Type 2 diabetes mellitus (HCC) 03/07/2018  . Type 2 diabetes mellitus without complication, without long-term current use of insulin (HCC) 03/07/2018   Formatting of this note might be different from the original. 2014.    Past Surgical History:  Procedure Laterality  Date  . CATARACT EXTRACTION W/ INTRAOCULAR LENS  IMPLANT, BILATERAL    . COLONOSCOPY W/ POLYPECTOMY    . CORONARY ARTERY BYPASS GRAFT N/A 10/27/2020   Procedure: CORONARY ARTERY BYPASS GRAFTING (CABG), ON PUMP, TIMES FOUR, USING LEFT INTERNAL MAMMARY ARTERY AND ENDOSCOPICALLY HARVESTED RIGHT GREATER SAPHENOUS VEIN;  Surgeon: [name], MD;  Location: [facility] OR;  Service: Open Heart Surgery;  Laterality: N/A;  . detatched retina    . kidney stone extraction     cystoscopy x 2  . KNEE SURGERY Left    bone spurs  . LEFT HEART CATH AND CORONARY ANGIOGRAPHY N/A 10/08/2020   Procedure: LEFT HEART CATH AND CORONARY ANGIOGRAPHY;  Surgeon: Marykay Lex, MD;  Location: [facility] INVASIVE CV LAB;  Service: Cardiovascular;  Laterality: N/A;  . NOSE SURGERY    . TEE WITHOUT CARDIOVERSION N/A 10/27/2020   Procedure: TRANSESOPHAGEAL ECHOCARDIOGRAM (TEE);  Surgeon: [name], MD;  Location: [facility] OR;  Service: Open Heart Surgery;  Laterality: N/A;    Current Medications: Current Meds  Medication Sig  . acetaminophen (TYLENOL) 500 MG tablet Take 500-1,000 mg by mouth every 6 (six) hours as needed (for pain.).  Marland Kitchen albuterol (PROVENTIL) (2.5 MG/3ML) 0.083% nebulizer solution Take 2.5 mg by nebulization every 6 (six) hours as needed.  Marland Kitchen albuterol (VENTOLIN HFA) 108 (90 Base) MCG/ACT inhaler Inhale into the lungs every 6 (six) hours as needed for wheezing or shortness of breath.  Marland Kitchen amiodarone (PACERONE) 200 MG tablet Take 1 tablet (200 mg total) by mouth 2 (two) times daily. X 7 days, then decrease to 200 mg daily  . aspirin EC 325 MG EC tablet Take 1 tablet (325 mg total) by mouth daily.  Marland Kitchen Dextran 70-Hypromellose, PF, (ARTIFICIAL TEARS PF) 0.1-0.3 % SOLN Place 1 drop into both eyes 3 (three) times daily as needed (dry/irritated eyes.).  Marland Kitchen empagliflozin (JARDIANCE) 10 MG TABS tablet Take 10 mg by mouth daily.  . furosemide (LASIX) 20 MG tablet Take 0.5 tablets (10 mg total) by mouth daily.  Marland Kitchen levothyroxine  (SYNTHROID) 50 MCG tablet Take 1 tablet (50 mcg total) by mouth daily before breakfast.  . metFORMIN (GLUCOPHAGE) 1000 MG tablet Take 1,000 mg by mouth 2 (two) times daily with a meal.  . Omega-3 Fatty Acids (FISH OIL ULTRA) 1400 MG CAPS Take 1,400 mg by mouth in the morning and at bedtime.  Marland Kitchen oxyCODONE (OXY IR/ROXICODONE) 5 MG immediate release tablet Take 1 tablet (5 mg total) by mouth every 4 (four) hours as needed for severe pain.  . pantoprazole (PROTONIX) 40 MG tablet Take 40 mg by mouth daily.  Marland Kitchen PARoxetine (PAXIL) 40 MG tablet Take 40 mg by mouth daily.  . potassium chloride SA (KLOR-CON) 20 MEQ tablet Take 1 tablet (20 mEq total) by mouth daily.  Marland Kitchen pyridostigmine (MESTINON) 60 MG tablet Take 60 mg by mouth 4 (four) times daily.  . Semaglutide,0.25 or 0.5MG /DOS, (OZEMPIC, 0.25 OR 0.5 MG/DOSE,) 2 MG/1.5ML SOPN Inject 0.5 mg into the skin every Wednesday.      Allergies:   Codeine, Hydrocodone-acetaminophen, Lipitor [  atorvastatin], and Latex   Social History   Socioeconomic History  . Marital status: Married    Spouse name: Not on file  . Number of children: Not on file  . Years of education: Not on file  . Highest education level: Not on file  Occupational History  . Not on file  Tobacco Use  . Smoking status: Former Smoker    Types: Cigars, Pipe    Quit date: 11/30/1999    Years since quitting: 21.0  . Smokeless tobacco: Current User    Types: Snuff  . Tobacco comment: stopped smoking pipe tobacco 30 years ago  Vaping Use  . Vaping Use: Never used  Substance and Sexual Activity  . Alcohol use: Yes    Comment: Occasional Beer   . Drug use: Never  . Sexual activity: Not on file  Other Topics Concern  . Not on file  Social History Narrative  . Not on file   Social Determinants of Health   Financial Resource Strain: Not on file  Food Insecurity: Not on file  Transportation Needs: Not on file  Physical Activity: Not on file  Stress: Not on file  Social Connections:  Not on file     Family History: The patient's family history includes Blindness in his father; CAD in his father; Cancer in his father; Heart Problems in his mother; Hypertension in his father; Rheum arthritis in his mother. There is no history of Colon cancer, Esophageal cancer, Rectal cancer, or Stomach cancer.  ROS:   Review of Systems  Constitution: Negative for decreased appetite, fever and weight gain.  HENT: Negative for congestion, ear discharge, hoarse voice and sore throat.   Eyes: Negative for discharge, redness, vision loss in right eye and visual halos.  Cardiovascular: Negative for chest pain, dyspnea on exertion, leg swelling, orthopnea and palpitations.  Respiratory: Negative for cough, hemoptysis, shortness of breath and snoring.   Endocrine: Negative for heat intolerance and polyphagia.  Hematologic/Lymphatic: Negative for bleeding problem. Does not bruise/bleed easily.  Skin: Negative for flushing, nail changes, rash and suspicious lesions.  Musculoskeletal: Negative for arthritis, joint pain, muscle cramps, myalgias, neck pain and stiffness.  Gastrointestinal: Negative for abdominal pain, bowel incontinence, diarrhea and excessive appetite.  Genitourinary: Negative for decreased libido, genital sores and incomplete emptying.  Neurological: Negative for brief paralysis, focal weakness, headaches and loss of balance.  Psychiatric/Behavioral: Negative for altered mental status, depression and suicidal ideas.  Allergic/Immunologic: Negative for HIV exposure and persistent infections.    EKGs/Labs/Other Studies Reviewed:    The following studies were reviewed today:   EKG: None today  Conclusion   There is mild left ventricular systolic dysfunction.  LV end diastolic pressure is normal.  There is no aortic valve stenosis.  Prox RCA lesion is 95% stenosed. Prox RCA to Mid RCA lesion is 50% stenosed. Mid RCA lesion is 70% stenosed. Mid RCA to Dist RCA lesion is  100% stenosed. (Fills via left-right collaterals from LCx)  Ost LM to Ost LAD lesion is 20% stenosed with 30% stenosed side branch in Ost Cx.  Ost LAD to Prox LAD lesion is 30% stenosed.  Mid Cx lesion is 90% stenosed with 90% stenosed side branch in 1st Mrg.  Mid Cx to Dist Cx lesion is 70% stenosed with 95% stenosed side branch in 2nd Mrg.  Prox LAD to Mid LAD lesion is 70% stenosed with 60% stenosed side branch in 1st Diag.  SUMMARY  Severe three-vessel disease:  ? Extensive proximal to mid  90 having 70 and 80% stenosis followed by 100% CTO (fills via left to right collaterals from AVG LCx);  ? LEFT MAIN diffuse 10 to 20% stenosis, 20% ostial LCx with 90% focal concentric stenosis at OM1 (1st Mrg) followed by a second 70% stenosis at a very small caliber OM 2,  ? Segmental calcified 40% proximal LAD with DFR positive long segment of 60-65% at the takeoff of D1 (DFR was 0.85, CT FFR was 0.79).  LAD after D1 (1st Diag) bifurcates more distally into tandem vessels of D2 and basely the septal LAD-is relatively small caliber  Mildly reduced LVEF with mild global hypokinesis EF roughly 45% by visual estimate. Low LVEDP   Recent Labs: 10/24/2020: ALT 21 10/28/2020: Magnesium 2.3 10/31/2020: Hemoglobin 10.1; Platelets 108 11/11/2020: BUN 18; Creatinine, Ser 1.30; NT-Pro BNP 247; Potassium 4.7; Sodium 137; TSH 29.200  Recent Lipid Panel No results found for: CHOL, TRIG, HDL, CHOLHDL, VLDL, LDLCALC, LDLDIRECT  Physical Exam:    VS:  BP 110/60   Pulse 65   Ht 5\' 6"  (1.676 m)   Wt 194 lb 3.2 oz (88.1 kg)   SpO2 98%   BMI 31.34 kg/m     Wt Readings from Last 3 Encounters:  12/05/20 194 lb 3.2 oz (88.1 kg)  12/03/20 188 lb (85.3 kg)  11/11/20 202 lb (91.6 kg)     GEN: Well nourished, well developed in no acute distress HEENT: Normal NECK: No JVD; No carotid bruits LYMPHATICS: No lymphadenopathy CARDIAC: S1S2 noted,RRR, no murmurs, rubs, gallops RESPIRATORY:  Clear to  auscultation without rales, wheezing or rhonchi  ABDOMEN: Soft, non-tender, non-distended, +bowel sounds, no guarding. EXTREMITIES: No edema, No cyanosis, no clubbing MUSCULOSKELETAL:  No deformity  SKIN: Warm and dry NEUROLOGIC:  Alert and oriented x 3, non-focal PSYCHIATRIC:  Normal affect, good insight  ASSESSMENT:    1. Coronary artery disease involving native coronary artery of native heart without angina pectoris   2. Type 2 diabetes mellitus without complication, without long-term current use of insulin (HCC)   3. Mixed hyperlipidemia   4. S/P CABG x 4   5. Obesity (BMI 30-39.9)    PLAN:     His ZIO monitor has not been officially reported but I was able to review the monitor findings which show evidence of atrial fibrillation less than 1% though.  For now I am going to continue the patient on his amiodarone.  Given his A. fib is well postoperatively, 12 weeks after surgery we could transition the patient off amiodarone and get another ZIO monitor.  History transesophageal plan for the upcoming weeks.  He had no specific questions about the TEE at this time.  In terms of his coronary artery disease he appears to be improving postoperative.  He does follow-up with CT surgery.  He is currently off statin due to muscle weakness he is a candidate for PCSK9 inhibitors and will recommend our lipid clinic at this time.  His blood pressure is acceptable no changes will be made to the antihypertensive regimen.  The patient understands the need to lose weight with diet and exercise. We have discussed specific strategies for this.   The patient is in agreement with the above plan. The patient left the office in stable condition.  The patient will follow up in 8 weeks with Dr. 11/13/20   Medication Adjustments/Labs and Tests Ordered: Current medicines are reviewed at length with the patient today.  Concerns regarding medicines are outlined above.  No orders of the defined  types were placed  in this encounter.  No orders of the defined types were placed in this encounter.   Patient Instructions  Medication Instructions:  Your physician recommends that you continue on your current medications as directed. Please refer to the Current Medication list given to you today.  *If you need a refill on your cardiac medications before your next appointment, please call your pharmacy*   Lab Work: NONE If you have labs (blood work) drawn today and your tests are completely normal, you will receive your results only by: Marland Kitchen MyChart Message (if you have MyChart) OR . A paper copy in the mail If you have any lab test that is abnormal or we need to change your treatment, we will call you to review the results.   Testing/Procedures: NONE   Follow-Up: At [facility], you and your health needs are our priority.  As part of our continuing mission to provide you with exceptional heart care, we have created designated Provider Care Teams.  These Care Teams include your primary Cardiologist (physician) and Advanced Practice Providers (APPs -  Physician Assistants and Nurse Practitioners) who all work together to provide you with the care you need, when you need it.  We recommend signing up for the patient portal called "MyChart".  Sign up information is provided on this After Visit Summary.  MyChart is used to connect with patients for Virtual Visits (Telemedicine).  Patients are able to view lab/test results, encounter notes, upcoming appointments, etc.  Non-urgent messages can be sent to your provider as well.   To learn more about what you can do with MyChart, go to [email or web address].    Your next appointment:   8 week(s)  The format for your next appointment:   In Person  Provider:   Norman Herrlich, MD   Other Instructions      Adopting a Healthy Lifestyle.  Know what a healthy weight is for you (roughly BMI <25) and aim to maintain this   Aim for 7+ servings of fruits  and vegetables daily   65-80+ fluid ounces of water or unsweet tea for healthy kidneys   Limit to max 1 drink of alcohol per day; avoid smoking/tobacco   Limit animal fats in diet for cholesterol and heart health - choose grass fed whenever available   Avoid highly processed foods, and foods high in saturated/trans fats   Aim for low stress - take time to unwind and care for your mental health   Aim for 150 min of moderate intensity exercise weekly for heart health, and weights twice weekly for bone health   Aim for 7-9 hours of sleep daily   When it comes to diets, agreement about the perfect plan isnt easy to find, even among the experts. Experts at the [facility] [employer] developed an idea known as the Healthy Eating Plate. Just imagine a plate divided into logical, healthy portions.   The emphasis is on diet quality:   Load up on vegetables and fruits - one-half of your plate: Aim for color and variety, and remember that potatoes dont count.   Go for whole grains - one-quarter of your plate: Whole wheat, barley, wheat berries, quinoa, oats, brown rice, and foods made with them. If you want pasta, go with whole wheat pasta.   Protein power - one-quarter of your plate: Fish, chicken, beans, and nuts are all healthy, versatile protein sources. Limit red meat.   The diet, however, does go beyond the plate,  offering a few other suggestions.   Use healthy plant oils, such as olive, canola, soy, corn, sunflower and peanut. Check the labels, and avoid partially hydrogenated oil, which have unhealthy trans fats.   If youre thirsty, drink water. Coffee and tea are good in moderation, but skip sugary drinks and limit milk and dairy products to one or two daily servings.   The type of carbohydrate in the diet is more important than the amount. Some sources of carbohydrates, such as vegetables, fruits, whole grains, and beans-are healthier than others.   Finally, stay  active  Signed, Thomasene Ripple, DO  12/05/2020 8:49 AM    [facility]

## 2020-12-05 NOTE — Progress Notes (Signed)
HPI: Patient returns for routine postoperative follow-up having undergone coronary artery bypass graft surgery x4 on 10/27/2020. The patient's early postoperative recovery while in the hospital was notable for development of postoperative atrial fibrillation converted with amiodarone.  He had a small right pneumothorax on his postoperative chest x-ray but this remained unchanged and did not require treatment. Since hospital discharge the patient reports that he has been feeling well.  He denies any chest pain or shortness of breath.  He was seen back in the office by Dr. Dulce Sellar and he was concerned about the possibility of left atrial appendage thrombus since the intraoperative TEE did not visualize the appendage very well.  He has been scheduled for a follow-up TEE to reevaluate this before deciding on the need for long-term anticoagulation.  He never had any history of atrial fibrillation preoperatively and only had a brief episode postoperatively that converted quickly with amiodarone.  I would think the likelihood of left atrial appendage thrombus would be low.  Anesthesiology did not mention the possibility of left atrial appendage thrombus in the operating room but we were not looking for this specifically with no history of atrial fibrillation.   Current Outpatient Medications  Medication Sig Dispense Refill  . acetaminophen (TYLENOL) 500 MG tablet Take 500-1,000 mg by mouth every 6 (six) hours as needed (for pain.).    Marland Kitchen albuterol (PROVENTIL) (2.5 MG/3ML) 0.083% nebulizer solution Take 2.5 mg by nebulization every 6 (six) hours as needed.    Marland Kitchen albuterol (VENTOLIN HFA) 108 (90 Base) MCG/ACT inhaler Inhale into the lungs every 6 (six) hours as needed for wheezing or shortness of breath.    Marland Kitchen amiodarone (PACERONE) 200 MG tablet Take 1 tablet (200 mg total) by mouth 2 (two) times daily. X 7 days, then decrease to 200 mg daily 60 tablet 1  . aspirin EC 325 MG EC tablet Take 1 tablet (325 mg  total) by mouth daily. 30 tablet 0  . Dextran 70-Hypromellose, PF, (ARTIFICIAL TEARS PF) 0.1-0.3 % SOLN Place 1 drop into both eyes 3 (three) times daily as needed (dry/irritated eyes.).    Marland Kitchen empagliflozin (JARDIANCE) 10 MG TABS tablet Take 10 mg by mouth daily.    . furosemide (LASIX) 20 MG tablet Take 0.5 tablets (10 mg total) by mouth daily. 45 tablet 3  . levothyroxine (SYNTHROID) 50 MCG tablet Take 1 tablet (50 mcg total) by mouth daily before breakfast. 90 tablet 3  . metFORMIN (GLUCOPHAGE) 1000 MG tablet Take 1,000 mg by mouth 2 (two) times daily with a meal.    . Omega-3 Fatty Acids (FISH OIL ULTRA) 1400 MG CAPS Take 1,400 mg by mouth in the morning and at bedtime.    Marland Kitchen oxyCODONE (OXY IR/ROXICODONE) 5 MG immediate release tablet Take 1 tablet (5 mg total) by mouth every 4 (four) hours as needed for severe pain. 30 tablet 0  . pantoprazole (PROTONIX) 40 MG tablet Take 40 mg by mouth daily.    Marland Kitchen PARoxetine (PAXIL) 40 MG tablet Take 40 mg by mouth daily.    . potassium chloride SA (KLOR-CON) 20 MEQ tablet Take 1 tablet (20 mEq total) by mouth daily. 90 tablet 3  . pyridostigmine (MESTINON) 60 MG tablet Take 60 mg by mouth 4 (four) times daily.    . Semaglutide,0.25 or 0.5MG /DOS, (OZEMPIC, 0.25 OR 0.5 MG/DOSE,) 2 MG/1.5ML SOPN Inject 0.5 mg into the skin every Wednesday.      No current facility-administered medications for this visit.    Physical  Exam: BP (!) 92/56   Pulse 64   Resp 20   Ht 5\' 6"  (1.676 m)   Wt 188 lb (85.3 kg)   SpO2 98% Comment: RA  BMI 30.34 kg/m  He looks well. Cardiac exam shows a regular rate and rhythm with normal heart sounds. Lung exam is clear. The chest incision is healing well and the sternum is stable. His leg incision is healing well and there is no lower extremity edema.  Diagnostic Tests:  Narrative & Impression  CLINICAL DATA:  65 year old male status post recent CABG.  EXAM: CHEST - 2 VIEW  COMPARISON:  12/03/2020 and prior  radiographs  FINDINGS: CABG changes again noted. Cardiomediastinal silhouette is unremarkable.  Minimal basilar atelectasis noted.  There is no evidence of focal airspace disease, pulmonary edema, suspicious pulmonary nodule/mass, pleural effusion, or pneumothorax.  No acute bony abnormalities are identified.  IMPRESSION: CABG changes with minimal basilar atelectasis.  No pneumothorax.   Electronically Signed   By: 01/31/2021 M.D.   On: 12/03/2020 11:53     Impression:  He is doing well 5 weeks following coronary bypass surgery.  I encouraged him to continue ambulating as much as possible.  I told him he can return to driving a car but should refrain from lifting anything heavier than 10 pounds for 3 months postoperatively.  His wife said he has already been out driving some of his heavy equipment around.  He is going to have a follow-up TEE in the near future to assess the left atrial appendage for thrombus.  Then a decision can be made by cardiology if he needs anticoagulation as well as how long to continue the amiodarone for postoperative atrial fibrillation.  Plan:  He will continue to follow-up with cardiology and will return to see me if he has any problems with his incisions.   01/31/2021, MD [facility] [phone]

## 2020-12-06 NOTE — Progress Notes (Signed)
Covid test will be cancelled as there is no scheduled procedure.

## 2020-12-08 ENCOUNTER — Inpatient Hospital Stay (HOSPITAL_COMMUNITY)
Admission: RE | Admit: 2020-12-08 | Discharge: 2020-12-08 | Disposition: A | Discharge status: Home / Self Care | Payer: Commercial Managed Care - PPO | Source: Ambulatory Visit

## 2020-12-08 ENCOUNTER — Other Ambulatory Visit (HOSPITAL_COMMUNITY)
Admission: RE | Admit: 2020-12-08 | Discharge: 2020-12-08 | Disposition: A | Discharge status: Home / Self Care | Payer: Commercial Managed Care - PPO | Source: Ambulatory Visit | Attending: Cardiology | Admitting: Cardiology

## 2020-12-08 DIAGNOSIS — Z01812 Encounter for preprocedural laboratory examination: Secondary | ICD-10-CM | POA: Diagnosis present

## 2020-12-08 DIAGNOSIS — U071 COVID-19: Secondary | ICD-10-CM | POA: Diagnosis not present

## 2020-12-08 LAB — SARS CORONAVIRUS 2 (TAT 6-24 HRS): SARS Coronavirus 2: POSITIVE — AB

## 2020-12-09 ENCOUNTER — Telehealth: Payer: Self-pay

## 2020-12-09 NOTE — Telephone Encounter (Signed)
-----   Message from Thomasene Ripple, DO sent at 12/08/2020  5:47 PM EST ----- Please let the patient know that he is positive for Covid. I would recommend quarantine for 10 days. Then ask him to please notify his recent contacts as well. We will have to wait 21 days before we can schedule the TEE.

## 2020-12-09 NOTE — Telephone Encounter (Signed)
Spoke with patient regarding results and recommendation.  Patient verbalizes understanding and is agreeable to plan of care. Advised patient to call back with any issues or concerns.  

## 2020-12-18 ENCOUNTER — Other Ambulatory Visit: Payer: Self-pay

## 2020-12-19 ENCOUNTER — Ambulatory Visit (INDEPENDENT_AMBULATORY_CARE_PROVIDER_SITE_OTHER): Payer: Commercial Managed Care - PPO | Admitting: Cardiology

## 2020-12-19 ENCOUNTER — Other Ambulatory Visit: Payer: Self-pay

## 2020-12-19 ENCOUNTER — Encounter: Payer: Self-pay | Admitting: Cardiology

## 2020-12-19 VITALS — BP 112/66 | HR 62 | Ht 66.0 in | Wt 196.0 lb

## 2020-12-19 DIAGNOSIS — I2510 Atherosclerotic heart disease of native coronary artery without angina pectoris: Secondary | ICD-10-CM

## 2020-12-19 DIAGNOSIS — I951 Orthostatic hypotension: Secondary | ICD-10-CM

## 2020-12-19 HISTORY — DX: Orthostatic hypotension: I95.1

## 2020-12-19 NOTE — Patient Instructions (Signed)
Medication Instructions:  Your physician has recommended you make the following change in your medication:   STOP: Amiodarone   *If you need a refill on your cardiac medications before your next appointment, please call your pharmacy*   Lab Work: Your physician recommends that you return for lab work today: bmp, mag, cbc  If you have labs (blood work) drawn today and your tests are completely normal, you will receive your results only by: Marland Kitchen MyChart Message (if you have MyChart) OR . A paper copy in the mail If you have any lab test that is abnormal or we need to change your treatment, we will call you to review the results.   Testing/Procedures: None   Follow-Up: At [facility], you and your health needs are our priority.  As part of our continuing mission to provide you with exceptional heart care, we have created designated Provider Care Teams.  These Care Teams include your primary Cardiologist (physician) and Advanced Practice Providers (APPs -  Physician Assistants and Nurse Practitioners) who all work together to provide you with the care you need, when you need it.  We recommend signing up for the patient portal called "MyChart".  Sign up information is provided on this After Visit Summary.  MyChart is used to connect with patients for Virtual Visits (Telemedicine).  Patients are able to view lab/test results, encounter notes, upcoming appointments, etc.  Non-urgent messages can be sent to your provider as well.   To learn more about what you can do with MyChart, go to [email or web address].    Your next appointment:   2 week(s)  The format for your next appointment:   In Person  Provider:   Dr. Dulce Sellar   Other Instructions  Please get a abdominal binder.

## 2020-12-19 NOTE — Progress Notes (Signed)
Cardiology Office Note:    Date:  12/19/2020   ID:  Derek Moore, Derek Moore 1956-01-19, MRN [id number]  PCP:  Gordan Payment., MD  Cardiologist:  Thomasene Ripple, DO  Electrophysiologist:  None   Referring MD: Gordan Payment., MD    History of Present Illness:    Derek Moore is a 65 y.o. male with a hx of male with a hx of coronary artery disease status post CABG x4, paroxysmal atrial fibrillation which is postoperative, hyperlipidemia, hypertension. Patient was last seen on December 05, 2020 at that time reviewed his ZIO monitor which showed evidence of postoperative A. fib, he was continued on his amiodarone. We also discussed transesophageal echocardiogram given that his intraoperative TEE reported evidence of left atrial appendage clots.    chest pain hyperlipidemia and coronary artery calcificationlast seen 10/02/2020.He was referred for cardiac CTA which showed a very high calcium score 739-88th percentile for age and sex he had moderate to severe multivessel CAD with 1 to 24% left main coronary artery 50 to 69% left anterior descending coronary artery stenosis proximal and mid vessel 25 to 49% left circumflex coronary artery and 50 to 69% right coronary artery stenosis. Subsequent fractional flow reserve showed diminished flow with hemodynamic stenosis in the left anterior descending coronary artery and left circumflex coronary artery.He subsequent underwent elective coronary artery bypass surgery 10/27/2020 with a left thoracic artery anastomosis to left anterior descending and 3 vein grafts.He developed early atrial fibrillation was loaded with IV amiodarone and resumed sinus rhythm. He had a small pneumothorax and chest x-ray with stable appearance did not require chest tube. The rhythm was sinus bradycardia and he was not placed on beta blocker.   The patient was seen by Dr. Dulce Sellar on December 40 2021 at that time as your monitor was placed on the patient to understand for  recurrent atrial fibrillation.  At that time it was also discussed as his intraoperative TEE reported left atrial appendage clot, after further review this was inconclusive therefore shared decision for follow-up TEE to rule out left atrial appendage clot.  At that time his statin was also stopped due to myasthenia gravis/muscle weakness.  I saw the patient on December 06, 2019 at that time we discussed his monitor results continue his amiodarone and plan for TEE.  Unfortunately in the interim the patient was unable to get his TEE done due to being COVID-positive.  He is here today as he requested a follow-up visit given the fact that he has been experiencing evidence of syncope and he tells me that usually when he does this his blood pressure is low.  He is taking his blood pressure when sitting 80/60 and standing 80/90 mmHg.  Other complaints at this time.  He is here with his wife.  Past Medical History:  Diagnosis Date  . Abnormal EKG 03/09/2018  . Acquired hypothyroidism 03/07/2018  . Allergy   . Aortic atherosclerosis (HCC) 07/17/2020   Formatting of this note might be different from the original. Seen on [facility] 06/2019  . Arthritis   . Asthma   . Cataract   . Chronic gout without tophus 03/07/2018  . Coronary artery calcification seen on CAT scan 07/17/2020   Formatting of this note might be different from the original. 06/2019. [facility]  . Depression 03/07/2018  . Diplopia 10/11/2018  . DISH (diffuse idiopathic skeletal hyperostosis) 07/17/2020   Formatting of this note might be different from the original. Seen on [facility] 06/2019  .  Dizzy 05/09/2018  . Gastroesophageal reflux disease without esophagitis 03/07/2018   Formatting of this note might be different from the original. Long term  . GERD (gastroesophageal reflux disease) 03/07/2018  . Gout 03/07/2018  . Heel spur, left 02/07/2020  . Hyperlipidemia 03/07/2018  . Hypothyroidism (acquired) 03/07/2018   Formatting of this note might be different from the  original. 1990s.  . Mixed hyperlipidemia 03/07/2018  . Ocular myasthenia gravis (HCC) 07/17/2020   Formatting of this note might be different from the original. Ocular.  Follows at [facility].  Sees neurology and eye doctor.  . Palpitation 03/08/2018  . Plantar fasciitis 02/07/2020  . Pneumonia   . Primary osteoarthritis involving multiple joints 07/17/2020  . Smokeless tobacco use 07/17/2020   Formatting of this note might be different from the original. Discussed chantix.  He wants to use. Did well in past. Off label, he understands.  . Tightness of heel cord, left 02/07/2020  . Type 2 diabetes mellitus (HCC) 03/07/2018  . Type 2 diabetes mellitus without complication, without long-term current use of insulin (HCC) 03/07/2018   Formatting of this note might be different from the original. 2014.    Past Surgical History:  Procedure Laterality Date  . CATARACT EXTRACTION W/ INTRAOCULAR LENS  IMPLANT, BILATERAL    . COLONOSCOPY W/ POLYPECTOMY    . CORONARY ARTERY BYPASS GRAFT N/A 10/27/2020   Procedure: CORONARY ARTERY BYPASS GRAFTING (CABG), ON PUMP, TIMES FOUR, USING LEFT INTERNAL MAMMARY ARTERY AND ENDOSCOPICALLY HARVESTED RIGHT GREATER SAPHENOUS VEIN;  Surgeon: [name], MD;  Location: [facility] OR;  Service: Open Heart Surgery;  Laterality: N/A;  . detatched retina    . kidney stone extraction     cystoscopy x 2  . KNEE SURGERY Left    bone spurs  . LEFT HEART CATH AND CORONARY ANGIOGRAPHY N/A 10/08/2020   Procedure: LEFT HEART CATH AND CORONARY ANGIOGRAPHY;  Surgeon: Marykay Lex, MD;  Location: [facility] INVASIVE CV LAB;  Service: Cardiovascular;  Laterality: N/A;  . NOSE SURGERY    . TEE WITHOUT CARDIOVERSION N/A 10/27/2020   Procedure: TRANSESOPHAGEAL ECHOCARDIOGRAM (TEE);  Surgeon: [name], MD;  Location[facility];  Service: Open Heart Surgery;  Laterality: N/A;    Current Medications: Current Meds  Medication Sig  . acetaminophen (TYLENOL) 500 MG tablet Take 500-1,000 mg by mouth every  6 (six) hours as needed (for pain.).  Marland Kitchen albuterol (PROVENTIL) (2.5 MG/3ML) 0.083% nebulizer solution Take 2.5 mg by nebulization every 6 (six) hours as needed.  Marland Kitchen albuterol (VENTOLIN HFA) 108 (90 Base) MCG/ACT inhaler Inhale into the lungs every 6 (six) hours as needed for wheezing or shortness of breath.  Marland Kitchen aspirin EC 325 MG EC tablet Take 1 tablet (325 mg total) by mouth daily.  Marland Kitchen Dextran 70-Hypromellose, PF, (ARTIFICIAL TEARS PF) 0.1-0.3 % SOLN Place 1 drop into both eyes 3 (three) times daily as needed (dry/irritated eyes.).  Marland Kitchen empagliflozin (JARDIANCE) 10 MG TABS tablet Take 10 mg by mouth daily.  . furosemide (LASIX) 20 MG tablet Take 0.5 tablets (10 mg total) by mouth daily.  Marland Kitchen levothyroxine (SYNTHROID) 75 MCG tablet Take 75 mcg by mouth daily at 6 (six) AM.  . metFORMIN (GLUCOPHAGE) 1000 MG tablet Take 1,000 mg by mouth 2 (two) times daily with a meal.  . Omega-3 Fatty Acids (FISH OIL ULTRA) 1400 MG CAPS Take 1,400 mg by mouth in the morning and at bedtime.  Marland Kitchen oxyCODONE (OXY IR/ROXICODONE) 5 MG immediate release tablet Take 1 tablet (5  mg total) by mouth every 4 (four) hours as needed for severe pain.  . pantoprazole (PROTONIX) 40 MG tablet Take 40 mg by mouth daily.  Marland Kitchen PARoxetine (PAXIL) 40 MG tablet Take 40 mg by mouth daily.  . potassium chloride SA (KLOR-CON) 20 MEQ tablet Take 1 tablet (20 mEq total) by mouth daily.  Marland Kitchen pyridostigmine (MESTINON) 60 MG tablet Take 60 mg by mouth 4 (four) times daily.  . Semaglutide,0.25 or 0.5MG /DOS, (OZEMPIC, 0.25 OR 0.5 MG/DOSE,) 2 MG/1.5ML SOPN Inject 0.5 mg into the skin every Wednesday.   . [DISCONTINUED] amiodarone (PACERONE) 200 MG tablet Take 1 tablet (200 mg total) by mouth 2 (two) times daily. X 7 days, then decrease to 200 mg daily     Allergies:   Codeine, Hydrocodone-acetaminophen, Lipitor [atorvastatin], and Latex   Social History   Socioeconomic History  . Marital status: Married    Spouse name: Not on file  . Number of children:  Not on file  . Years of education: Not on file  . Highest education level: Not on file  Occupational History  . Not on file  Tobacco Use  . Smoking status: Former Smoker    Types: Cigars, Pipe    Quit date: 11/30/1999    Years since quitting: 21.0  . Smokeless tobacco: Current User    Types: Snuff  . Tobacco comment: stopped smoking pipe tobacco 30 years ago  Vaping Use  . Vaping Use: Never used  Substance and Sexual Activity  . Alcohol use: Yes    Comment: Occasional Beer   . Drug use: Never  . Sexual activity: Not on file  Other Topics Concern  . Not on file  Social History Narrative  . Not on file   Social Determinants of Health   Financial Resource Strain: Not on file  Food Insecurity: Not on file  Transportation Needs: Not on file  Physical Activity: Not on file  Stress: Not on file  Social Connections: Not on file     Family History: The patient's family history includes Blindness in his father; CAD in his father; Cancer in his father; Heart Problems in his mother; Hypertension in his father; Rheum arthritis in his mother. There is no history of Colon cancer, Esophageal cancer, Rectal cancer, or Stomach cancer.  ROS:   Review of Systems  Constitution: Negative for decreased appetite, fever and weight gain.  HENT: Negative for congestion, ear discharge, hoarse voice and sore throat.   Eyes: Negative for discharge, redness, vision loss in right eye and visual halos.  Cardiovascular: Negative for chest pain, dyspnea on exertion, leg swelling, orthopnea and palpitations.  Respiratory: Negative for cough, hemoptysis, shortness of breath and snoring.   Endocrine: Negative for heat intolerance and polyphagia.  Hematologic/Lymphatic: Negative for bleeding problem. Does not bruise/bleed easily.  Skin: Negative for flushing, nail changes, rash and suspicious lesions.  Musculoskeletal: Negative for arthritis, joint pain, muscle cramps, myalgias, neck pain and stiffness.   Gastrointestinal: Negative for abdominal pain, bowel incontinence, diarrhea and excessive appetite.  Genitourinary: Negative for decreased libido, genital sores and incomplete emptying.  Neurological: Negative for brief paralysis, focal weakness, headaches and loss of balance.  Psychiatric/Behavioral: Negative for altered mental status, depression and suicidal ideas.  Allergic/Immunologic: Negative for HIV exposure and persistent infections.    EKGs/Labs/Other Studies Reviewed:    The following studies were reviewed today:   EKG: None today  Conclusion   There is mild left ventricular systolic dysfunction.  LV end diastolic pressure is normal.  There is no aortic valve stenosis.  Prox RCA lesion is 95% stenosed. Prox RCA to Mid RCA lesion is 50% stenosed. Mid RCA lesion is 70% stenosed. Mid RCA to Dist RCA lesion is 100% stenosed. (Fills via left-right collaterals from LCx)  Ost LM to Ost LAD lesion is 20% stenosed with 30% stenosed side branch in Ost Cx.  Ost LAD to Prox LAD lesion is 30% stenosed.  Mid Cx lesion is 90% stenosed with 90% stenosed side branch in 1st Mrg.  Mid Cx to Dist Cx lesion is 70% stenosed with 95% stenosed side branch in 2nd Mrg.  Prox LAD to Mid LAD lesion is 70% stenosed with 60% stenosed side branch in 1st Diag.  SUMMARY  Severe three-vessel disease:  ? Extensive proximal to mid 90 having 70 and 80% stenosis followed by 100% CTO (fills via left to right collaterals from AVG LCx);  ? LEFT MAIN diffuse 10 to 20% stenosis, 20% ostial LCx with 90% focal concentric stenosis at OM1 (1st Mrg) followed by a second 70% stenosis at a very small caliber OM 2,  ? Segmental calcified 40% proximal LAD with DFR positive long segment of 60-65% at the takeoff of D1 (DFR was 0.85, CT FFR was 0.79).  LAD after D1 (1st Diag) bifurcates more distally into tandem vessels of D2 and basely the septal LAD-is relatively small caliber  Mildly reduced LVEF with mild  global hypokinesis EF roughly 45% by visual estimate. Low LVEDP   Recent Labs: 10/24/2020: ALT 21 10/28/2020: Magnesium 2.3 10/31/2020: Hemoglobin 10.1; Platelets 108 11/11/2020: BUN 18; Creatinine, Ser 1.30; NT-Pro BNP 247; Potassium 4.7; Sodium 137; TSH 29.200  Recent Lipid Panel No results found for: CHOL, TRIG, HDL, CHOLHDL, VLDL, LDLCALC, LDLDIRECT  Physical Exam:    VS:  BP 112/66   Pulse 62   Ht 5\' 6"  (1.676 m)   Wt 196 lb (88.9 kg)   SpO2 98%   BMI 31.64 kg/m     Wt Readings from Last 3 Encounters:  12/19/20 196 lb (88.9 kg)  12/05/20 194 lb 3.2 oz (88.1 kg)  12/03/20 188 lb (85.3 kg)     GEN: Well nourished, well developed in no acute distress HEENT: Normal NECK: No JVD; No carotid bruits LYMPHATICS: No lymphadenopathy CARDIAC: S1S2 noted,RRR, no murmurs, rubs, gallops RESPIRATORY:  Clear to auscultation without rales, wheezing or rhonchi  ABDOMEN: Soft, non-tender, non-distended, +bowel sounds, no guarding. EXTREMITIES: No edema, No cyanosis, no clubbing MUSCULOSKELETAL:  No deformity  SKIN: Warm and dry NEUROLOGIC:  Alert and oriented x 3, non-focal PSYCHIATRIC:  Normal affect, good insight  ASSESSMENT:    1. Orthostatic hypotension   2. Coronary artery disease involving native coronary artery of native heart without angina pectoris    PLAN:      He is orthostatic positive, I do believe his symptoms is secondary to orthostatic hypotension.  There is supportive I discussed with the patient working to stop amiodarone for now.  He does not take the Lasix any longer.  Also advised the patient to get support hose as well as abdominal binder to see if this can help with his symptoms.  In the meantime we will get blood work today including CBC, mag and BMP.  No anginal symptoms.    Plan for TEE 21 days post COVID test.  Investigate the evidence of left atrial appendage clot.   The patient is in agreement with the above plan. The patient left the office in  stable condition.  The patient  will follow up in 2 weeks or sooner if needed.   Medication Adjustments/Labs and Tests Ordered: Current medicines are reviewed at length with the patient today.  Concerns regarding medicines are outlined above.  Orders Placed This Encounter  Procedures  . Basic metabolic panel  . Magnesium  . CBC   No orders of the defined types were placed in this encounter.   Patient Instructions  Medication Instructions:  Your physician has recommended you make the following change in your medication:   STOP: Amiodarone   *If you need a refill on your cardiac medications before your next appointment, please call your pharmacy*   Lab Work: Your physician recommends that you return for lab work today: bmp, mag, cbc  If you have labs (blood work) drawn today and your tests are completely normal, you will receive your results only by: Marland Kitchen MyChart Message (if you have MyChart) OR . A paper copy in the mail If you have any lab test that is abnormal or we need to change your treatment, we will call you to review the results.   Testing/Procedures: None   Follow-Up: At [facility], you and your health needs are our priority.  As part of our continuing mission to provide you with exceptional heart care, we have created designated Provider Care Teams.  These Care Teams include your primary Cardiologist (physician) and Advanced Practice Providers (APPs -  Physician Assistants and Nurse Practitioners) who all work together to provide you with the care you need, when you need it.  We recommend signing up for the patient portal called "MyChart".  Sign up information is provided on this After Visit Summary.  MyChart is used to connect with patients for Virtual Visits (Telemedicine).  Patients are able to view lab/test results, encounter notes, upcoming appointments, etc.  Non-urgent messages can be sent to your provider as well.   To learn more about what you can do with  MyChart, go to [email or web address].    Your next appointment:   2 week(s)  The format for your next appointment:   In Person  Provider:   Dr. Dulce Sellar   Other Instructions  Please get a abdominal binder.      Adopting a Healthy Lifestyle.  Know what a healthy weight is for you (roughly BMI <25) and aim to maintain this   Aim for 7+ servings of fruits and vegetables daily   65-80+ fluid ounces of water or unsweet tea for healthy kidneys   Limit to max 1 drink of alcohol per day; avoid smoking/tobacco   Limit animal fats in diet for cholesterol and heart health - choose grass fed whenever available   Avoid highly processed foods, and foods high in saturated/trans fats   Aim for low stress - take time to unwind and care for your mental health   Aim for 150 min of moderate intensity exercise weekly for heart health, and weights twice weekly for bone health   Aim for 7-9 hours of sleep daily   When it comes to diets, agreement about the perfect plan isnt easy to find, even among the experts. Experts at the [facility] [employer] developed an idea known as the Healthy Eating Plate. Just imagine a plate divided into logical, healthy portions.   The emphasis is on diet quality:   Load up on vegetables and fruits - one-half of your plate: Aim for color and variety, and remember that potatoes dont count.   Go for whole grains - one-quarter of  your plate: Whole wheat, barley, wheat berries, quinoa, oats, brown rice, and foods made with them. If you want pasta, go with whole wheat pasta.   Protein power - one-quarter of your plate: Fish, chicken, beans, and nuts are all healthy, versatile protein sources. Limit red meat.   The diet, however, does go beyond the plate, offering a few other suggestions.   Use healthy plant oils, such as olive, canola, soy, corn, sunflower and peanut. Check the labels, and avoid partially hydrogenated oil, which have unhealthy trans  fats.   If youre thirsty, drink water. Coffee and tea are good in moderation, but skip sugary drinks and limit milk and dairy products to one or two daily servings.   The type of carbohydrate in the diet is more important than the amount. Some sources of carbohydrates, such as vegetables, fruits, whole grains, and beans-are healthier than others.   Finally, stay active  Signed, Thomasene Ripple, DO  12/19/2020 10:27 AM    [facility]

## 2020-12-20 LAB — BASIC METABOLIC PANEL
BUN/Creatinine Ratio: 14 (ref 10–24)
BUN: 16 mg/dL (ref 8–27)
CO2: 21 mmol/L (ref 20–29)
Calcium: 9.2 mg/dL (ref 8.6–10.2)
Chloride: 103 mmol/L (ref 96–106)
Creatinine, Ser: 1.13 mg/dL (ref 0.76–1.27)
GFR calc Af Amer: 78 mL/min/{1.73_m2} (ref >59)
GFR calc non Af Amer: 67 mL/min/{1.73_m2} (ref >59)
Glucose: 183 mg/dL — ABNORMAL HIGH (ref 65–99)
Potassium: 4 mmol/L (ref 3.5–5.2)
Sodium: 137 mmol/L (ref 134–144)

## 2020-12-20 LAB — CBC
Hematocrit: 40.8 % (ref 37.5–51.0)
Hemoglobin: 13.6 g/dL (ref 13.0–17.7)
MCH: 28.9 pg (ref 26.6–33.0)
MCHC: 33.3 g/dL (ref 31.5–35.7)
MCV: 87 fL (ref 79–97)
Platelets: 196 10*3/uL (ref 150–450)
RBC: 4.7 x10E6/uL (ref 4.14–5.80)
RDW: 13 % (ref 11.6–15.4)
WBC: 5.2 10*3/uL (ref 3.4–10.8)

## 2020-12-20 LAB — MAGNESIUM: Magnesium: 1.8 mg/dL (ref 1.6–2.3)

## 2021-01-01 NOTE — Progress Notes (Signed)
Cardiology Office Note:    Date:  01/02/2021   ID:  Derek Moore, Derek Moore 1956/02/16, MRN [id number]  PCP:  Gordan Payment., MD  Cardiologist:  Norman Herrlich, MD    Referring MD: Gordan Payment., MD    ASSESSMENT:    1. Orthostatic hypotension   2. Thrombus of left atrial appendage   3. Coronary artery disease involving native coronary artery of native heart without angina pectoris   4. S/P CABG x 4    PLAN:    In order of problems listed above:  1. He remains symptomatic he is taking and S2-2 inhibitor I told him to stop.  If this continues and is symptomatic he will need midodrine.  As part of his labs prior to a TEE will check cortisol level 2. I looked at his TEE with my partner the views are suboptimal and I think before we commit him to lifelong anticoagulation will do a TEE and go ahead and schedule as he is recovered from his COVID-19 infection 3. Stable CAD engage in cardiac rehabilitation continue current treatment including aspirin not the beta-blocker and at the moment is not on lipid-lowering treatment 4. Stable slowly progressing   Next appointment: 3 months   Medication Adjustments/Labs and Tests Ordered: Current medicines are reviewed at length with the patient today.  Concerns regarding medicines are outlined above.  No orders of the defined types were placed in this encounter.  No orders of the defined types were placed in this encounter.   No chief complaint on file.   History of Present Illness:    GRIFFON Moore is a 65 y.o. male with a hx of CAD with CABG, paroxysmal atrial fibrillation postoperatively hypertension and hyper lipidemia last seen 12/10/2020.  On chart review his intraoperative TEE showed left atrial appendage clot.  Subsequently I reviewed the images with my partner Dr. Servando Salina who felt the study was suboptimal and an adequate for full evaluation of the atrial appendage and recommended to undergo transesophageal echocardiogram.   Unfortunately he was found to be COVID-19 positive on prescreening.  When last seen in the office he was having symptomatic hypotension in the context of COVID-19 infection. Compliance with diet, lifestyle and medications: Yes  He continues to have hypotension recurred today after cardiac rehab.  He has taken high-dose steroids in his life and I will check a fasting cortisol level he takes an SGLT2 inhibitor and I told him to stop.  He is taking no other agents affecting his blood pressure.  He has made a good recovery from his bypass surgery no chest pain shortness of breath palpitation or syncope.  He did not have a TEE because of COVID-19 infection that was asymptomatic.  We will go ahead and schedule his TEE at [facility] at the moment or not doing them at [facility]. Past Medical History:  Diagnosis Date  . Abnormal EKG 03/09/2018  . Acquired hypothyroidism 03/07/2018  . Allergy   . Aortic atherosclerosis (HCC) 07/17/2020   Formatting of this note might be different from the original. Seen on CT [facility] 06/2019  . Arthritis   . Asthma   . Cataract   . Chronic gout without tophus 03/07/2018  . Coronary artery calcification seen on CAT scan 07/17/2020   Formatting of this note might be different from the original. 06/2019. [facility]  . Depression 03/07/2018  . Diplopia 10/11/2018  . DISH (diffuse idiopathic skeletal hyperostosis) 07/17/2020   Formatting of this note might be  different from the original. Seen on CT [facility] 06/2019  . Dizzy 05/09/2018  . Gastroesophageal reflux disease without esophagitis 03/07/2018   Formatting of this note might be different from the original. Long term  . GERD (gastroesophageal reflux disease) 03/07/2018  . Gout 03/07/2018  . Heel spur, left 02/07/2020  . Hyperlipidemia 03/07/2018  . Hypothyroidism (acquired) 03/07/2018   Formatting of this note might be different from the original. 1990s.  . Mixed hyperlipidemia 03/07/2018  . Ocular myasthenia gravis (HCC) 07/17/2020    Formatting of this note might be different from the original. Ocular.  Follows at [facility].  Sees neurology and eye doctor.  . Palpitation 03/08/2018  . Plantar fasciitis 02/07/2020  . Pneumonia   . Primary osteoarthritis involving multiple joints 07/17/2020  . Smokeless tobacco use 07/17/2020   Formatting of this note might be different from the original. Discussed chantix.  He wants to use. Did well in past. Off label, he understands.  . Tightness of heel cord, left 02/07/2020  . Type 2 diabetes mellitus (HCC) 03/07/2018  . Type 2 diabetes mellitus without complication, without long-term current use of insulin (HCC) 03/07/2018   Formatting of this note might be different from the original. 2014.    Past Surgical History:  Procedure Laterality Date  . CATARACT EXTRACTION W/ INTRAOCULAR LENS  IMPLANT, BILATERAL    . COLONOSCOPY W/ POLYPECTOMY    . CORONARY ARTERY BYPASS GRAFT N/A 10/27/2020   Procedure: CORONARY ARTERY BYPASS GRAFTING (CABG), ON PUMP, TIMES FOUR, USING LEFT INTERNAL MAMMARY ARTERY AND ENDOSCOPICALLY HARVESTED RIGHT GREATER SAPHENOUS VEIN;  Surgeon: [name], MD;  Location: [facility] OR;  Service: Open Heart Surgery;  Laterality: N/A;  . detatched retina    . kidney stone extraction     cystoscopy x 2  . KNEE SURGERY Left    bone spurs  . LEFT HEART CATH AND CORONARY ANGIOGRAPHY N/A 10/08/2020   Procedure: LEFT HEART CATH AND CORONARY ANGIOGRAPHY;  Surgeon: Marykay Lex, MD;  Location: [facility] INVASIVE CV LAB;  Service: Cardiovascular;  Laterality: N/A;  . NOSE SURGERY    . TEE WITHOUT CARDIOVERSION N/A 10/27/2020   Procedure: TRANSESOPHAGEAL ECHOCARDIOGRAM (TEE);  Surgeon: [name], MD;  Location: [facility] OR;  Service: Open Heart Surgery;  Laterality: N/A;    Current Medications: Current Meds  Medication Sig  . acetaminophen (TYLENOL) 500 MG tablet Take 500-1,000 mg by mouth every 6 (six) hours as needed (for pain.).  Marland Kitchen albuterol (PROVENTIL) (2.5 MG/3ML) 0.083% nebulizer  solution Take 2.5 mg by nebulization every 6 (six) hours as needed.  Marland Kitchen albuterol (VENTOLIN HFA) 108 (90 Base) MCG/ACT inhaler Inhale into the lungs every 6 (six) hours as needed for wheezing or shortness of breath.  Marland Kitchen aspirin EC 325 MG EC tablet Take 1 tablet (325 mg total) by mouth daily.  Marland Kitchen Dextran 70-Hypromellose, PF, (ARTIFICIAL TEARS PF) 0.1-0.3 % SOLN Place 1 drop into both eyes 3 (three) times daily as needed (dry/irritated eyes.).  Marland Kitchen empagliflozin (JARDIANCE) 10 MG TABS tablet Take 10 mg by mouth daily.  . furosemide (LASIX) 20 MG tablet Take 0.5 tablets (10 mg total) by mouth daily.  Marland Kitchen levothyroxine (SYNTHROID) 75 MCG tablet Take 75 mcg by mouth daily at 6 (six) AM.  . metFORMIN (GLUCOPHAGE) 1000 MG tablet Take 1,000 mg by mouth 2 (two) times daily with a meal.  . Omega-3 Fatty Acids (FISH OIL ULTRA) 1400 MG CAPS Take 1,400 mg by mouth in the morning and at bedtime.  Marland Kitchen oxyCODONE (  OXY IR/ROXICODONE) 5 MG immediate release tablet Take 1 tablet (5 mg total) by mouth every 4 (four) hours as needed for severe pain.  . pantoprazole (PROTONIX) 40 MG tablet Take 40 mg by mouth daily.  Marland Kitchen PARoxetine (PAXIL) 40 MG tablet Take 40 mg by mouth daily.  . potassium chloride SA (KLOR-CON) 20 MEQ tablet Take 1 tablet (20 mEq total) by mouth daily.  Marland Kitchen pyridostigmine (MESTINON) 60 MG tablet Take 60 mg by mouth 4 (four) times daily.  . Semaglutide,0.25 or 0.5MG /DOS, (OZEMPIC, 0.25 OR 0.5 MG/DOSE,) 2 MG/1.5ML SOPN Inject 0.5 mg into the skin every Wednesday.      Allergies:   Codeine, Hydrocodone-acetaminophen, Lipitor [atorvastatin], and Latex   Social History   Socioeconomic History  . Marital status: Married    Spouse name: Not on file  . Number of children: Not on file  . Years of education: Not on file  . Highest education level: Not on file  Occupational History  . Not on file  Tobacco Use  . Smoking status: Former Smoker    Types: Cigars, Pipe    Quit date: 11/30/1999    Years since  quitting: 21.1  . Smokeless tobacco: Current User    Types: Snuff  . Tobacco comment: stopped smoking pipe tobacco 30 years ago  Vaping Use  . Vaping Use: Never used  Substance and Sexual Activity  . Alcohol use: Yes    Comment: Occasional Beer   . Drug use: Never  . Sexual activity: Not on file  Other Topics Concern  . Not on file  Social History Narrative  . Not on file   Social Determinants of Health   Financial Resource Strain: Not on file  Food Insecurity: Not on file  Transportation Needs: Not on file  Physical Activity: Not on file  Stress: Not on file  Social Connections: Not on file     Family History: The patient's family history includes Blindness in his father; CAD in his father; Cancer in his father; Heart Problems in his mother; Hypertension in his father; Rheum arthritis in his mother. There is no history of Colon cancer, Esophageal cancer, Rectal cancer, or Stomach cancer. ROS:   Please see the history of present illness.    All other systems reviewed and are negative.  EKGs/Labs/Other Studies Reviewed:    The following studies were reviewed today:    Recent Labs: 10/24/2020: ALT 21 11/11/2020: NT-Pro BNP 247; TSH 29.200 12/19/2020: BUN 16; Creatinine, Ser 1.13; Hemoglobin 13.6; Magnesium 1.8; Platelets 196; Potassium 4.0; Sodium 137  Recent Lipid Panel No results found for: CHOL, TRIG, HDL, CHOLHDL, VLDL, LDLCALC, LDLDIRECT  Physical Exam:    VS:  BP 100/62   Pulse 60   Ht 5\' 6"  (1.676 m)   Wt 198 lb (89.8 kg)   SpO2 98%   BMI 31.96 kg/m     Wt Readings from Last 3 Encounters:  01/02/21 198 lb (89.8 kg)  12/19/20 196 lb (88.9 kg)  12/05/20 194 lb 3.2 oz (88.1 kg)     GEN:  Well nourished, well developed in no acute distress HEENT: Normal NECK: No JVD; No carotid bruits LYMPHATICS: No lymphadenopathy CARDIAC: RRR, no murmurs, rubs, gallops RESPIRATORY:  Clear to auscultation without rales, wheezing or rhonchi  ABDOMEN: Soft,  non-tender, non-distended MUSCULOSKELETAL:  No edema; No deformity  SKIN: Warm and dry NEUROLOGIC:  Alert and oriented x 3 PSYCHIATRIC:  Normal affect    Signed, Norman Herrlich, MD  01/02/2021 9:48 AM  [facility]

## 2021-01-01 NOTE — H&P (View-Only) (Signed)
Cardiology Office Note:    Date:  01/02/2021   ID:  Moore, Derek 1956/02/16, MRN [id number]  PCP:  Gordan Payment., MD  Cardiologist:  Norman Herrlich, MD    Referring MD: Gordan Payment., MD    ASSESSMENT:    1. Orthostatic hypotension   2. Thrombus of left atrial appendage   3. Coronary artery disease involving native coronary artery of native heart without angina pectoris   4. S/P CABG x 4    PLAN:    In order of problems listed above:  1. He remains symptomatic he is taking and S2-2 inhibitor I told him to stop.  If this continues and is symptomatic he will need midodrine.  As part of his labs prior to a TEE will check cortisol level 2. I looked at his TEE with my partner the views are suboptimal and I think before we commit him to lifelong anticoagulation will do a TEE and go ahead and schedule as he is recovered from his COVID-19 infection 3. Stable CAD engage in cardiac rehabilitation continue current treatment including aspirin not the beta-blocker and at the moment is not on lipid-lowering treatment 4. Stable slowly progressing   Next appointment: 3 months   Medication Adjustments/Labs and Tests Ordered: Current medicines are reviewed at length with the patient today.  Concerns regarding medicines are outlined above.  No orders of the defined types were placed in this encounter.  No orders of the defined types were placed in this encounter.   No chief complaint on file.   History of Present Illness:    Derek Moore is a 65 y.o. male with a hx of CAD with CABG, paroxysmal atrial fibrillation postoperatively hypertension and hyper lipidemia last seen 12/10/2020.  On chart review his intraoperative TEE showed left atrial appendage clot.  Subsequently I reviewed the images with my partner Dr. Servando Salina who felt the study was suboptimal and an adequate for full evaluation of the atrial appendage and recommended to undergo transesophageal echocardiogram.   Unfortunately he was found to be COVID-19 positive on prescreening.  When last seen in the office he was having symptomatic hypotension in the context of COVID-19 infection. Compliance with diet, lifestyle and medications: Yes  He continues to have hypotension recurred today after cardiac rehab.  He has taken high-dose steroids in his life and I will check a fasting cortisol level he takes an SGLT2 inhibitor and I told him to stop.  He is taking no other agents affecting his blood pressure.  He has made a good recovery from his bypass surgery no chest pain shortness of breath palpitation or syncope.  He did not have a TEE because of COVID-19 infection that was asymptomatic.  We will go ahead and schedule his TEE at [facility] at the moment or not doing them at [facility]. Past Medical History:  Diagnosis Date  . Abnormal EKG 03/09/2018  . Acquired hypothyroidism 03/07/2018  . Allergy   . Aortic atherosclerosis (HCC) 07/17/2020   Formatting of this note might be different from the original. Seen on CT [facility] 06/2019  . Arthritis   . Asthma   . Cataract   . Chronic gout without tophus 03/07/2018  . Coronary artery calcification seen on CAT scan 07/17/2020   Formatting of this note might be different from the original. 06/2019. [facility]  . Depression 03/07/2018  . Diplopia 10/11/2018  . DISH (diffuse idiopathic skeletal hyperostosis) 07/17/2020   Formatting of this note might be  different from the original. Seen on CT [facility] 06/2019  . Dizzy 05/09/2018  . Gastroesophageal reflux disease without esophagitis 03/07/2018   Formatting of this note might be different from the original. Long term  . GERD (gastroesophageal reflux disease) 03/07/2018  . Gout 03/07/2018  . Heel spur, left 02/07/2020  . Hyperlipidemia 03/07/2018  . Hypothyroidism (acquired) 03/07/2018   Formatting of this note might be different from the original. 1990s.  . Mixed hyperlipidemia 03/07/2018  . Ocular myasthenia gravis (HCC) 07/17/2020    Formatting of this note might be different from the original. Ocular.  Follows at [facility].  Sees neurology and eye doctor.  . Palpitation 03/08/2018  . Plantar fasciitis 02/07/2020  . Pneumonia   . Primary osteoarthritis involving multiple joints 07/17/2020  . Smokeless tobacco use 07/17/2020   Formatting of this note might be different from the original. Discussed chantix.  He wants to use. Did well in past. Off label, he understands.  . Tightness of heel cord, left 02/07/2020  . Type 2 diabetes mellitus (HCC) 03/07/2018  . Type 2 diabetes mellitus without complication, without long-term current use of insulin (HCC) 03/07/2018   Formatting of this note might be different from the original. 2014.    Past Surgical History:  Procedure Laterality Date  . CATARACT EXTRACTION W/ INTRAOCULAR LENS  IMPLANT, BILATERAL    . COLONOSCOPY W/ POLYPECTOMY    . CORONARY ARTERY BYPASS GRAFT N/A 10/27/2020   Procedure: CORONARY ARTERY BYPASS GRAFTING (CABG), ON PUMP, TIMES FOUR, USING LEFT INTERNAL MAMMARY ARTERY AND ENDOSCOPICALLY HARVESTED RIGHT GREATER SAPHENOUS VEIN;  Surgeon: [name], MD;  Location: [facility] OR;  Service: Open Heart Surgery;  Laterality: N/A;  . detatched retina    . kidney stone extraction     cystoscopy x 2  . KNEE SURGERY Left    bone spurs  . LEFT HEART CATH AND CORONARY ANGIOGRAPHY N/A 10/08/2020   Procedure: LEFT HEART CATH AND CORONARY ANGIOGRAPHY;  Surgeon: Marykay Lex, MD;  Location: [facility] INVASIVE CV LAB;  Service: Cardiovascular;  Laterality: N/A;  . NOSE SURGERY    . TEE WITHOUT CARDIOVERSION N/A 10/27/2020   Procedure: TRANSESOPHAGEAL ECHOCARDIOGRAM (TEE);  Surgeon: [name], MD;  Location: [facility] OR;  Service: Open Heart Surgery;  Laterality: N/A;    Current Medications: Current Meds  Medication Sig  . acetaminophen (TYLENOL) 500 MG tablet Take 500-1,000 mg by mouth every 6 (six) hours as needed (for pain.).  Marland Kitchen albuterol (PROVENTIL) (2.5 MG/3ML) 0.083% nebulizer  solution Take 2.5 mg by nebulization every 6 (six) hours as needed.  Marland Kitchen albuterol (VENTOLIN HFA) 108 (90 Base) MCG/ACT inhaler Inhale into the lungs every 6 (six) hours as needed for wheezing or shortness of breath.  Marland Kitchen aspirin EC 325 MG EC tablet Take 1 tablet (325 mg total) by mouth daily.  Marland Kitchen Dextran 70-Hypromellose, PF, (ARTIFICIAL TEARS PF) 0.1-0.3 % SOLN Place 1 drop into both eyes 3 (three) times daily as needed (dry/irritated eyes.).  Marland Kitchen empagliflozin (JARDIANCE) 10 MG TABS tablet Take 10 mg by mouth daily.  . furosemide (LASIX) 20 MG tablet Take 0.5 tablets (10 mg total) by mouth daily.  Marland Kitchen levothyroxine (SYNTHROID) 75 MCG tablet Take 75 mcg by mouth daily at 6 (six) AM.  . metFORMIN (GLUCOPHAGE) 1000 MG tablet Take 1,000 mg by mouth 2 (two) times daily with a meal.  . Omega-3 Fatty Acids (FISH OIL ULTRA) 1400 MG CAPS Take 1,400 mg by mouth in the morning and at bedtime.  Marland Kitchen oxyCODONE (  OXY IR/ROXICODONE) 5 MG immediate release tablet Take 1 tablet (5 mg total) by mouth every 4 (four) hours as needed for severe pain.  . pantoprazole (PROTONIX) 40 MG tablet Take 40 mg by mouth daily.  . PARoxetine (PAXIL) 40 MG tablet Take 40 mg by mouth daily.  . potassium chloride SA (KLOR-CON) 20 MEQ tablet Take 1 tablet (20 mEq total) by mouth daily.  . pyridostigmine (MESTINON) 60 MG tablet Take 60 mg by mouth 4 (four) times daily.  . Semaglutide,0.25 or 0.5MG/DOS, (OZEMPIC, 0.25 OR 0.5 MG/DOSE,) 2 MG/1.5ML SOPN Inject 0.5 mg into the skin every Wednesday.      Allergies:   Codeine, Hydrocodone-acetaminophen, Lipitor [atorvastatin], and Latex   Social History   Socioeconomic History  . Marital status: Married    Spouse name: Not on file  . Number of children: Not on file  . Years of education: Not on file  . Highest education level: Not on file  Occupational History  . Not on file  Tobacco Use  . Smoking status: Former Smoker    Types: Cigars, Pipe    Quit date: 11/30/1999    Years since  quitting: 21.1  . Smokeless tobacco: Current User    Types: Snuff  . Tobacco comment: stopped smoking pipe tobacco 30 years ago  Vaping Use  . Vaping Use: Never used  Substance and Sexual Activity  . Alcohol use: Yes    Comment: Occasional Beer   . Drug use: Never  . Sexual activity: Not on file  Other Topics Concern  . Not on file  Social History Narrative  . Not on file   Social Determinants of Health   Financial Resource Strain: Not on file  Food Insecurity: Not on file  Transportation Needs: Not on file  Physical Activity: Not on file  Stress: Not on file  Social Connections: Not on file     Family History: The patient's family history includes Blindness in his father; CAD in his father; Cancer in his father; Heart Problems in his mother; Hypertension in his father; Rheum arthritis in his mother. There is no history of Colon cancer, Esophageal cancer, Rectal cancer, or Stomach cancer. ROS:   Please see the history of present illness.    All other systems reviewed and are negative.  EKGs/Labs/Other Studies Reviewed:    The following studies were reviewed today:    Recent Labs: 10/24/2020: ALT 21 11/11/2020: NT-Pro BNP 247; TSH 29.200 12/19/2020: BUN 16; Creatinine, Ser 1.13; Hemoglobin 13.6; Magnesium 1.8; Platelets 196; Potassium 4.0; Sodium 137  Recent Lipid Panel No results found for: CHOL, TRIG, HDL, CHOLHDL, VLDL, LDLCALC, LDLDIRECT  Physical Exam:    VS:  BP 100/62   Pulse 60   Ht 5' 6" (1.676 m)   Wt 198 lb (89.8 kg)   SpO2 98%   BMI 31.96 kg/m     Wt Readings from Last 3 Encounters:  01/02/21 198 lb (89.8 kg)  12/19/20 196 lb (88.9 kg)  12/05/20 194 lb 3.2 oz (88.1 kg)     GEN:  Well nourished, well developed in no acute distress HEENT: Normal NECK: No JVD; No carotid bruits LYMPHATICS: No lymphadenopathy CARDIAC: RRR, no murmurs, rubs, gallops RESPIRATORY:  Clear to auscultation without rales, wheezing or rhonchi  ABDOMEN: Soft,  non-tender, non-distended MUSCULOSKELETAL:  No edema; No deformity  SKIN: Warm and dry NEUROLOGIC:  Alert and oriented x 3 PSYCHIATRIC:  Normal affect    Signed, Kamau Weatherall, MD  01/02/2021 9:48 AM      [facility]

## 2021-01-02 ENCOUNTER — Encounter: Payer: Self-pay | Admitting: Cardiology

## 2021-01-02 ENCOUNTER — Other Ambulatory Visit: Payer: Self-pay

## 2021-01-02 ENCOUNTER — Ambulatory Visit (INDEPENDENT_AMBULATORY_CARE_PROVIDER_SITE_OTHER): Payer: Commercial Managed Care - PPO | Admitting: Cardiology

## 2021-01-02 VITALS — BP 100/62 | HR 60 | Ht 66.0 in | Wt 198.0 lb

## 2021-01-02 DIAGNOSIS — I2510 Atherosclerotic heart disease of native coronary artery without angina pectoris: Secondary | ICD-10-CM

## 2021-01-02 DIAGNOSIS — I513 Intracardiac thrombosis, not elsewhere classified: Secondary | ICD-10-CM

## 2021-01-02 DIAGNOSIS — Z951 Presence of aortocoronary bypass graft: Secondary | ICD-10-CM | POA: Diagnosis not present

## 2021-01-02 DIAGNOSIS — I951 Orthostatic hypotension: Secondary | ICD-10-CM | POA: Diagnosis not present

## 2021-01-02 NOTE — Patient Instructions (Signed)
Medication Instructions:  Your physician has recommended you make the following change in your medication:  STOP: Jardiance *If you need a refill on your cardiac medications before your next appointment, please call your pharmacy*   Lab Work: Your physician recommends that you return for lab work in: TODAY CBC, BMP, Cortisol level If you have labs (blood work) drawn today and your tests are completely normal, you will receive your results only by: Marland Kitchen MyChart Message (if you have MyChart) OR . A paper copy in the mail If you have any lab test that is abnormal or we need to change your treatment, we will call you to review the results.   Testing/Procedures: You are scheduled for a TEE on February 10th 2022 with Dr. Delton See.  Please arrive at the [facility] (Main Entrance A) at [facility]: [address] [place], [place] [address] at 9 am.   DIET: Nothing to eat or drink after midnight except a sip of water with medications (see medication instructions below)  Medication Instructions: Hold Metformin, Semaglutide    Labs: YOU HAD YOUR LABS DRAWN TODAY 01/02/2021  You must have a responsible person to drive you home and stay in the waiting area during your procedure. Failure to do so could result in cancellation.  Bring your insurance cards.  *Special Note: Every effort is made to have your procedure done on time. Occasionally there are emergencies that occur at the hospital that may cause delays. Please be patient if a delay does occur.     Follow-Up: At [facility], you and your health needs are our priority.  As part of our continuing mission to provide you with exceptional heart care, we have created designated Provider Care Teams.  These Care Teams include your primary Cardiologist (physician) and Advanced Practice Providers (APPs -  Physician Assistants and Nurse Practitioners) who all work together to provide you with the care you need, when you need it.  We  recommend signing up for the patient portal called "MyChart".  Sign up information is provided on this After Visit Summary.  MyChart is used to connect with patients for Virtual Visits (Telemedicine).  Patients are able to view lab/test results, encounter notes, upcoming appointments, etc.  Non-urgent messages can be sent to your provider as well.   To learn more about what you can do with MyChart, go to [email or web address].    Your next appointment:   3 month(s)  The format for your next appointment:   In Person  Provider:   Norman Herrlich, MD   Other Instructions

## 2021-01-03 LAB — BASIC METABOLIC PANEL
BUN/Creatinine Ratio: 17 (ref 10–24)
BUN: 20 mg/dL (ref 8–27)
CO2: 21 mmol/L (ref 20–29)
Calcium: 9.3 mg/dL (ref 8.6–10.2)
Chloride: 100 mmol/L (ref 96–106)
Creatinine, Ser: 1.17 mg/dL (ref 0.76–1.27)
GFR calc Af Amer: 75 mL/min/{1.73_m2} (ref >59)
GFR calc non Af Amer: 65 mL/min/{1.73_m2} (ref >59)
Glucose: 130 mg/dL — ABNORMAL HIGH (ref 65–99)
Potassium: 4.2 mmol/L (ref 3.5–5.2)
Sodium: 139 mmol/L (ref 134–144)

## 2021-01-03 LAB — CBC
Hematocrit: 42 % (ref 37.5–51.0)
Hemoglobin: 13.6 g/dL (ref 13.0–17.7)
MCH: 28.9 pg (ref 26.6–33.0)
MCHC: 32.4 g/dL (ref 31.5–35.7)
MCV: 89 fL (ref 79–97)
Platelets: 221 10*3/uL (ref 150–450)
RBC: 4.7 x10E6/uL (ref 4.14–5.80)
RDW: 13.4 % (ref 11.6–15.4)
WBC: 5.7 10*3/uL (ref 3.4–10.8)

## 2021-01-03 LAB — CORTISOL: Cortisol: 7.6 ug/dL

## 2021-01-08 ENCOUNTER — Encounter (HOSPITAL_COMMUNITY)
Admission: RE | Disposition: A | Discharge status: Home / Self Care | Payer: Self-pay | Source: Home / Self Care | Attending: Cardiology

## 2021-01-08 ENCOUNTER — Encounter (HOSPITAL_COMMUNITY): Payer: Self-pay | Admitting: Cardiology

## 2021-01-08 ENCOUNTER — Telehealth: Payer: Self-pay | Admitting: Cardiology

## 2021-01-08 ENCOUNTER — Ambulatory Visit (HOSPITAL_COMMUNITY)
Admission: RE | Admit: 2021-01-08 | Discharge: 2021-01-08 | Disposition: A | Discharge status: Home / Self Care | Payer: Commercial Managed Care - PPO | Attending: Cardiology | Admitting: Cardiology

## 2021-01-08 ENCOUNTER — Ambulatory Visit (HOSPITAL_COMMUNITY): Payer: Commercial Managed Care - PPO | Admitting: Certified Registered Nurse Anesthetist

## 2021-01-08 ENCOUNTER — Other Ambulatory Visit (HOSPITAL_COMMUNITY): Payer: Self-pay | Admitting: Internal Medicine

## 2021-01-08 ENCOUNTER — Ambulatory Visit (HOSPITAL_BASED_OUTPATIENT_CLINIC_OR_DEPARTMENT_OTHER): Payer: Commercial Managed Care - PPO

## 2021-01-08 DIAGNOSIS — E1136 Type 2 diabetes mellitus with diabetic cataract: Secondary | ICD-10-CM | POA: Insufficient documentation

## 2021-01-08 DIAGNOSIS — Z7984 Long term (current) use of oral hypoglycemic drugs: Secondary | ICD-10-CM | POA: Diagnosis not present

## 2021-01-08 DIAGNOSIS — I2510 Atherosclerotic heart disease of native coronary artery without angina pectoris: Secondary | ICD-10-CM | POA: Insufficient documentation

## 2021-01-08 DIAGNOSIS — Z79899 Other long term (current) drug therapy: Secondary | ICD-10-CM | POA: Insufficient documentation

## 2021-01-08 DIAGNOSIS — Z87891 Personal history of nicotine dependence: Secondary | ICD-10-CM | POA: Insufficient documentation

## 2021-01-08 DIAGNOSIS — Z91040 Latex allergy status: Secondary | ICD-10-CM | POA: Insufficient documentation

## 2021-01-08 DIAGNOSIS — I951 Orthostatic hypotension: Secondary | ICD-10-CM | POA: Diagnosis not present

## 2021-01-08 DIAGNOSIS — I119 Hypertensive heart disease without heart failure: Secondary | ICD-10-CM | POA: Diagnosis not present

## 2021-01-08 DIAGNOSIS — Z885 Allergy status to narcotic agent status: Secondary | ICD-10-CM | POA: Insufficient documentation

## 2021-01-08 DIAGNOSIS — Z7982 Long term (current) use of aspirin: Secondary | ICD-10-CM | POA: Insufficient documentation

## 2021-01-08 DIAGNOSIS — Z951 Presence of aortocoronary bypass graft: Secondary | ICD-10-CM | POA: Insufficient documentation

## 2021-01-08 DIAGNOSIS — Z8249 Family history of ischemic heart disease and other diseases of the circulatory system: Secondary | ICD-10-CM | POA: Diagnosis not present

## 2021-01-08 DIAGNOSIS — I513 Intracardiac thrombosis, not elsewhere classified: Secondary | ICD-10-CM

## 2021-01-08 DIAGNOSIS — Z8616 Personal history of COVID-19: Secondary | ICD-10-CM | POA: Insufficient documentation

## 2021-01-08 DIAGNOSIS — Z888 Allergy status to other drugs, medicaments and biological substances status: Secondary | ICD-10-CM | POA: Insufficient documentation

## 2021-01-08 DIAGNOSIS — E782 Mixed hyperlipidemia: Secondary | ICD-10-CM | POA: Diagnosis not present

## 2021-01-08 DIAGNOSIS — I340 Nonrheumatic mitral (valve) insufficiency: Secondary | ICD-10-CM | POA: Diagnosis not present

## 2021-01-08 HISTORY — PX: TEE WITHOUT CARDIOVERSION: SHX5443

## 2021-01-08 SURGERY — ECHOCARDIOGRAM, TRANSESOPHAGEAL
Anesthesia: Monitor Anesthesia Care

## 2021-01-08 MED ORDER — PROPOFOL 500 MG/50ML IV EMUL
INTRAVENOUS | Status: DC | PRN
  Administered 2021-01-08: 125 ug/kg/min via INTRAVENOUS

## 2021-01-08 MED ORDER — EPHEDRINE SULFATE 50 MG/ML IJ SOLN
INTRAMUSCULAR | Status: DC | PRN
  Administered 2021-01-08: 10 mg via INTRAVENOUS

## 2021-01-08 MED ORDER — SODIUM CHLORIDE 0.9 % IV SOLN: INTRAVENOUS | Status: DC

## 2021-01-08 MED ORDER — APIXABAN 5 MG PO TABS: 5.0000 mg | ORAL_TABLET | Freq: Two times a day (BID) | ORAL | 3 refills | Status: DC

## 2021-01-08 MED ORDER — PROPOFOL 10 MG/ML IV BOLUS
INTRAVENOUS | Status: DC | PRN
  Administered 2021-01-08 (×2): 30 mg via INTRAVENOUS
  Administered 2021-01-08 (×2): 20 mg via INTRAVENOUS

## 2021-01-08 MED ORDER — SODIUM CHLORIDE 0.9 % IV SOLN: INTRAVENOUS | Status: DC | PRN

## 2021-01-08 MED ORDER — ASPIRIN EC 81 MG PO TBEC: 81.0000 mg | DELAYED_RELEASE_TABLET | Freq: Every day | ORAL | 3 refills | Status: DC

## 2021-01-08 NOTE — Anesthesia Postprocedure Evaluation (Signed)
Anesthesia Post Note  Patient: Derek Moore  Procedure(s) Performed: TRANSESOPHAGEAL ECHOCARDIOGRAM (TEE) (N/A )     Patient location during evaluation: Endoscopy Anesthesia Type: MAC Level of consciousness: awake and alert, patient cooperative and oriented Pain management: pain level controlled Vital Signs Assessment: post-procedure vital signs reviewed and stable Respiratory status: spontaneous breathing, nonlabored ventilation and respiratory function stable Cardiovascular status: stable and blood pressure returned to baseline Postop Assessment: no apparent nausea or vomiting and able to ambulate Anesthetic complications: no   No complications documented.  Last Vitals:  Vitals:   01/08/21 1125 01/08/21 1126  BP: 111/61 117/70  Pulse: (!) 53   Resp: 15   Temp:    SpO2: 96%     Last Pain:  Vitals:   01/08/21 1045  TempSrc: Temporal  PainSc: 0-No pain                 Breton Berns,E. Ameka Krigbaum

## 2021-01-08 NOTE — CV Procedure (Signed)
     Transesophageal Echocardiogram Note  Derek Moore [id number] Mar 10, 1956  Procedure: Transesophageal Echocardiogram Indications: LAA thrombus  Procedure Details Consent: Obtained Time Out: Verified patient identification, verified procedure, site/side was marked, verified correct patient position, special equipment/implants available, Radiology Safety Procedures followed,  medications/allergies/relevent history reviewed, required imaging and test results available.  Performed  Medications: Propofol 291 mg  1. Systemic anticoagulation is recommended.  2. There seems to be an old formed thrombus attached to the pectinate  muscles in the left atrial appendage.  3. Left ventricular ejection fraction, by estimation, is 55 to 60%. The  left ventricle has normal function. The left ventricle has no regional  wall motion abnormalities. There is mild concentric left ventricular  hypertrophy. Left ventricular diastolic  function could not be evaluated.  4. Right ventricular systolic function is normal. The right ventricular  size is normal.  5. Left atrial size was mildly dilated. A left atrial/left atrial  appendage thrombus was detected. The LAA emptying velocity was 80 cm/s.  6. The mitral valve is normal in structure. Mild mitral valve  regurgitation. No evidence of mitral stenosis.  7. The aortic valve is normal in structure. Aortic valve regurgitation is  not visualized. No aortic stenosis is present.  8. There is mild (Grade II) plaque.  9. The inferior vena cava is normal in size with greater than 50%  respiratory variability, suggesting right atrial pressure of 3 mmHg.   Conclusion(s)/Recommendation(s): Normal biventricular function without  evidence of hemodynamically significant valvular heart disease.  Complications: No apparent complications Patient did tolerate procedure well.  Tobias Alexander, MD, [facility] 01/08/2021, 10:55 AM   Addendum:    Recommendations:   1. Systemic anticoagulation is recommended. Consider repeating a TEE or CTA to re-evaluate after 3 full months of anticoagulation  2. The patient reports food getting stuck in his throat ever since his CABG surgery/intubation, he is recommended to be evaluated by GI for a potential EGD.  3. There is a significant difference in his RUE and LUE BP measurements, a bilateral upper extremity arterial Duplex is recommended.   Tobias Alexander, MD 01/08/2021

## 2021-01-08 NOTE — Progress Notes (Signed)
  Echocardiogram Echocardiogram Transesophageal has been performed.  Jarquis Walker G Shalita Notte 01/08/2021, 11:06 AM

## 2021-01-08 NOTE — Telephone Encounter (Signed)
Pt called in and stated that he went have a TEE this morning and they found a Blood clot in his arm.  He would like to see DR [facility] asap or find want does need   Best number [phone]

## 2021-01-08 NOTE — Telephone Encounter (Signed)
Spoke to the patient just now and let him know Dr. Munley's recommendations. He verbalizes understanding and thanks me for the call back.  

## 2021-01-08 NOTE — Anesthesia Preprocedure Evaluation (Addendum)
Anesthesia Evaluation  Patient identified by MRN, date of birth, ID band Patient awake    Reviewed: Allergy & Precautions, NPO status , Patient's Chart, lab work & pertinent test results  History of Anesthesia Complications Negative for: history of anesthetic complications  Airway Mallampati: I  TM Distance: >3 FB Neck ROM: Full    Dental  (+) Dental Advisory Given, Caps   Pulmonary COPD,  COPD inhaler, former smoker,  12/08/2020 SARS coronavirus POS No respiratory symptoms presently   breath sounds clear to auscultation       Cardiovascular (-) angina+ CAD, + CABG and + Peripheral Vascular Disease   Rhythm:Regular Rate:Normal  11/12/2020 ECHO: EF 45-50%. The left ventricle has mildly decreased function, global hypokinesis, no significant valvular disease   Neuro/Psych Anxiety Depression Ocular myasthenia    GI/Hepatic Neg liver ROS, GERD  Medicated and Controlled,  Endo/Other  diabetes, Oral Hypoglycemic AgentsHypothyroidism obese  Renal/GU negative Renal ROS     Musculoskeletal  (+) Arthritis ,   Abdominal (+) + obese,   Peds  Hematology negative hematology ROS (+)   Anesthesia Other Findings   Reproductive/Obstetrics                            Anesthesia Physical Anesthesia Plan  ASA: III  Anesthesia Plan: MAC   Post-op Pain Management:    Induction:   PONV Risk Score and Plan: 1 and Ondansetron and Treatment may vary due to age or medical condition  Airway Management Planned: Natural Airway and Nasal Cannula  Additional Equipment: None  Intra-op Plan:   Post-operative Plan:   Informed Consent: I have reviewed the patients History and Physical, chart, labs and discussed the procedure including the risks, benefits and alternatives for the proposed anesthesia with the patient or authorized representative who has indicated his/her understanding and acceptance.     Dental  advisory given  Plan Discussed with: CRNA and Surgeon  Anesthesia Plan Comments:        Anesthesia Quick Evaluation

## 2021-01-08 NOTE — Anesthesia Procedure Notes (Addendum)
Procedure Name: MAC Date/Time: 01/08/2021 10:09 AM Performed by: Kathryne Hitch, CRNA Pre-anesthesia Checklist: Patient identified, Emergency Drugs available, Suction available and Patient being monitored Patient Re-evaluated:Patient Re-evaluated prior to induction Oxygen Delivery Method: Nasal cannula Preoxygenation: Pre-oxygenation with 100% oxygen Induction Type: IV induction Placement Confirmation: positive ETCO2 Dental Injury: Teeth and Oropharynx as per pre-operative assessment

## 2021-01-08 NOTE — Discharge Instructions (Signed)
TEE  YOU HAD AN CARDIAC PROCEDURE TODAY: Refer to the procedure report and other information in the discharge instructions given to you for any specific questions about what was found during the examination. If this information does not answer your questions, please call [facility] office at [phone] to clarify.   DIET: Your first meal following the procedure should be a light meal and then it is ok to progress to your normal diet. A half-sandwich or bowl of soup is an example of a good first meal. Heavy or fried foods are harder to digest and may make you feel nauseous or bloated. Drink plenty of fluids but you should avoid alcoholic beverages for 24 hours. If you had a esophageal dilation, please see attached instructions for diet.   ACTIVITY: Your care partner should take you home directly after the procedure. You should plan to take it easy, moving slowly for the rest of the day. You can resume normal activity the day after the procedure however YOU SHOULD NOT DRIVE, use power tools, machinery or perform tasks that involve climbing or major physical exertion for 24 hours (because of the sedation medicines used during the test).   SYMPTOMS TO REPORT IMMEDIATELY: A cardiologist can be reached at any hour. Please call [phone] for any of the following symptoms:  Vomiting of blood or coffee ground material  New, significant abdominal pain  New, significant chest pain or pain under the shoulder blades  Painful or persistently difficult swallowing  New shortness of breath  Black, tarry-looking or red, bloody stools  FOLLOW UP:  Please also call with any specific questions about appointments or follow up tests.    Monitored Anesthesia Care, Care After This sheet gives you information about how to care for yourself after your procedure. Your health care provider may also give you more specific instructions. If you have problems or questions, contact your health care provider. What  can I expect after the procedure? After the procedure, it is common to have:  Tiredness.  Forgetfulness about what happened after the procedure.  Impaired judgment for important decisions.  Nausea or vomiting.  Some difficulty with balance. Follow these instructions at home: For the time period you were told by your health care provider:  Rest as needed.  Do not participate in activities where you could fall or become injured.  Do not drive or use machinery.  Do not drink alcohol.  Do not take sleeping pills or medicines that cause drowsiness.  Do not make important decisions or sign legal documents.  Do not take care of children on your own.      Eating and drinking  Follow the diet that is recommended by your health care provider.  Drink enough fluid to keep your urine pale yellow.  If you vomit: ? Drink water, juice, or soup when you can drink without vomiting. ? Make sure you have little or no nausea before eating solid foods. General instructions  Have a responsible adult stay with you for the time you are told. It is important to have someone help care for you until you are awake and alert.  Take over-the-counter and prescription medicines only as told by your health care provider.  If you have sleep apnea, surgery and certain medicines can increase your risk for breathing problems. Follow instructions from your health care provider about wearing your sleep device: ? Anytime you are sleeping, including during daytime naps. ? While taking prescription pain medicines, sleeping medicines, or medicines that make  you drowsy.  Avoid smoking.  Keep all follow-up visits as told by your health care provider. This is important. Contact a health care provider if:  You keep feeling nauseous or you keep vomiting.  You feel light-headed.  You are still sleepy or having trouble with balance after 24 hours.  You develop a rash.  You have a fever.  You have  redness or swelling around the IV site. Get help right away if:  You have trouble breathing.  You have new-onset confusion at home. Summary  For several hours after your procedure, you may feel tired. You may also be forgetful and have poor judgment.  Have a responsible adult stay with you for the time you are told. It is important to have someone help care for you until you are awake and alert.  Rest as told. Do not drive or operate machinery. Do not drink alcohol or take sleeping pills.  Get help right away if you have trouble breathing, or if you suddenly become confused. This information is not intended to replace advice given to you by your health care provider. Make sure you discuss any questions you have with your health care provider. Document Revised: 07/31/2020 Document Reviewed: 10/18/2019 Elsevier Patient Education  2021 [employer].

## 2021-01-08 NOTE — Interval H&P Note (Signed)
History and Physical Interval Note:  01/08/2021 9:24 AM  Derek Moore  has presented today for surgery, with the diagnosis of THROMBUS OF LEFT ATRIAL APPENDAGE.  The various methods of treatment have been discussed with the patient and family. After consideration of risks, benefits and other options for treatment, the patient has consented to  Procedure(s): TRANSESOPHAGEAL ECHOCARDIOGRAM (TEE) (N/A) as a surgical intervention.  The patient's history has been reviewed, patient examined, no change in status, stable for surgery.  I have reviewed the patient's chart and labs.  Questions were answered to the patient's satisfaction.     Tobias Alexander

## 2021-01-08 NOTE — Transfer of Care (Signed)
Immediate Anesthesia Transfer of Care Note  Patient: Derek Moore  Procedure(s) Performed: TRANSESOPHAGEAL ECHOCARDIOGRAM (TEE) (N/A )  Patient Location: Endoscopy Unit  Anesthesia Type:MAC  Level of Consciousness: drowsy and patient cooperative  Airway & Oxygen Therapy: Patient Spontanous Breathing and Patient connected to nasal cannula oxygen  Post-op Assessment: Report given to RN and Post -op Vital signs reviewed and stable  Post vital signs: Reviewed and stable  Last Vitals:  Vitals Value Taken Time  BP 82/40 01/08/21 1044  Temp    Pulse 64 01/08/21 1045  Resp 19 01/08/21 1045  SpO2 98 % 01/08/21 1045  Vitals shown include unvalidated device data.  Last Pain:  Vitals:   01/08/21 0900  TempSrc: Temporal  PainSc: 0-No pain         Complications: No complications documented.

## 2021-01-08 NOTE — Telephone Encounter (Signed)
Reduce aspirin 81 mg daily  Start Eliquis 5 mg twice daily  I reviewed his record it is an old thrombus and I think he would do well with the medications above

## 2021-01-11 ENCOUNTER — Encounter (HOSPITAL_COMMUNITY): Payer: Self-pay | Admitting: Cardiology

## 2021-01-12 ENCOUNTER — Encounter: Payer: Self-pay | Admitting: Gastroenterology

## 2021-01-15 LAB — GLUCOSE, CAPILLARY: Glucose-Capillary: 113 mg/dL — ABNORMAL HIGH (ref 70–99)

## 2021-01-27 DIAGNOSIS — R229 Localized swelling, mass and lump, unspecified: Secondary | ICD-10-CM

## 2021-01-27 HISTORY — DX: Localized swelling, mass and lump, unspecified: R22.9

## 2021-01-28 DIAGNOSIS — J329 Chronic sinusitis, unspecified: Secondary | ICD-10-CM | POA: Insufficient documentation

## 2021-01-28 DIAGNOSIS — K029 Dental caries, unspecified: Secondary | ICD-10-CM | POA: Insufficient documentation

## 2021-01-28 DIAGNOSIS — J301 Allergic rhinitis due to pollen: Secondary | ICD-10-CM | POA: Insufficient documentation

## 2021-01-28 DIAGNOSIS — E669 Obesity, unspecified: Secondary | ICD-10-CM | POA: Insufficient documentation

## 2021-01-29 ENCOUNTER — Ambulatory Visit (INDEPENDENT_AMBULATORY_CARE_PROVIDER_SITE_OTHER): Payer: Commercial Managed Care - PPO | Admitting: Gastroenterology

## 2021-01-29 ENCOUNTER — Encounter: Payer: Self-pay | Admitting: Gastroenterology

## 2021-01-29 VITALS — HR 68 | Ht 66.0 in | Wt 196.1 lb

## 2021-01-29 DIAGNOSIS — R1319 Other dysphagia: Secondary | ICD-10-CM

## 2021-01-29 DIAGNOSIS — K449 Diaphragmatic hernia without obstruction or gangrene: Secondary | ICD-10-CM | POA: Diagnosis not present

## 2021-01-29 HISTORY — DX: Hypomagnesemia: E83.42

## 2021-01-29 NOTE — Patient Instructions (Signed)
If you are age 67 or older, your body mass index should be between 23-30. Your Body mass index is 31.66 kg/m. If this is out of the aforementioned range listed, please consider follow up with your Primary Care Provider.  If you are age 40 or younger, your body mass index should be between 19-25. Your Body mass index is 31.66 kg/m. If this is out of the aformentioned range listed, please consider follow up with your Primary Care Provider.   You have been scheduled for a Barium Esophogram with barium tablet at [facility] (1st floor of the hospital) on 03/10 at 9am. Please arrive 15 minutes prior to your appointment for registration. Make certain not to have anything to eat or drink 3 hours prior to your test. If you need to reschedule for any reason, please contact radiology at [phone] to do so. __________________________________________________________________ A barium swallow is an examination that concentrates on views of the esophagus. This tends to be a double contrast exam (barium and two liquids which, when combined, create a gas to distend the wall of the oesophagus) or single contrast (non-ionic iodine based). The study is usually tailored to your symptoms so a good history is essential. Attention is paid during the study to the form, structure and configuration of the esophagus, looking for functional disorders (such as aspiration, dysphagia, achalasia, motility and reflux) EXAMINATION You may be asked to change into a gown, depending on the type of swallow being performed. A radiologist and radiographer will perform the procedure. The radiologist will advise you of the type of contrast selected for your procedure and direct you during the exam. You will be asked to stand, sit or lie in several different positions and to hold a small amount of fluid in your mouth before being asked to swallow while the imaging is performed .In some instances you may be asked to swallow barium coated  marshmallows to assess the motility of a solid food bolus. The exam can be recorded as a digital or video fluoroscopy procedure. POST PROCEDURE It will take 1-2 days for the barium to pass through your system. To facilitate this, it is important, unless otherwise directed, to increase your fluids for the next 24-48hrs and to resume your normal diet.  This test typically takes about 30 minutes to perform. __________________________________________________________________________________  Continue with Protonix  Call in 3 months to schedule follow-up appointment  Thank you,  Dr. Lynann Bologna

## 2021-01-29 NOTE — Progress Notes (Signed)
Chief Complaint:   Referring Provider:  Gordan Payment., MD      ASSESSMENT AND PLAN;   #1. Eso dysphagia  #2. GERD with HH  #3. CAD s/p CABG Nov 2021, now with atrial appendage thrombus on TEE 12/2020 on Eliquis.    Plan: -Barium swallow with barium tablet (special attention to neck).  -Continue Protonix 40 mg p.o. once a day. -I have instructed patient that he needs to chew food specially meats and breads well and eat slowly.  Of note that he also had a recent TEE. -EGD with dilatation only when okay with Dr. Dulce Sellar.  Per his notes, Kathlene November cannot be taken off Eliquis in near future. -FU in 12 weeks.   HPI:    Derek Moore is a 65 y.o. male  S/P CABG Nov 2021, atrial appendage clot on TEE 01/08/2021 on baby ASA/Eliquis H/O myasthenia gravis  Has been having solid food dysphagia intermittently, mostly in the neck area, ever since he had CABG November 2021.  At times has problems swallowing pills esp Metformin.  No heartburn.  No odynophagia.  No melena, hemoptysis, or weight loss.  Dysphagia is not worse towards the end of the day.  Of note that he was not having any dysphagia with myasthenia gravis (Dx when he had ptosis).  No abdominal pain.  He denies having any diarrhea or constipation.   Past GI procedures: Colonoscopy 09/2018:  - Mild left colonic diverticulosis. - Non-bleeding internal hemorrhoids. - Otherwise normal colonoscopy. - Repeat in 10 years.  Earlier, if with any new problems.  EGD 03/2007: Small hiatal hernia, mild gastritis.  MBS 07/2012: Mild pharyngeal dysphagia  SH: Married to [employer] Past Medical History:  Diagnosis Date  . Abnormal EKG 03/09/2018  . Acquired hypothyroidism 03/07/2018  . Allergy   . Aortic atherosclerosis (HCC) 07/17/2020   Formatting of this note might be different from the original. Seen on [facility] 06/2019  . Arthritis   . Asthma   . Cataract   . Chronic gout without tophus 03/07/2018  . Coronary artery calcification seen on  CAT scan 07/17/2020   Formatting of this note might be different from the original. 06/2019. [facility]  . Cough syncope 1999  . Dental caries   . Depression 03/07/2018  . Diplopia 10/11/2018  . DISH (diffuse idiopathic skeletal hyperostosis) 07/17/2020   Formatting of this note might be different from the original. Seen on CT [facility] 06/2019  . Dizzy 05/09/2018  . Gastroesophageal reflux disease without esophagitis 03/07/2018   Formatting of this note might be different from the original. Long term  . GERD (gastroesophageal reflux disease) 03/07/2018  . Gout 03/07/2018  . Hay fever   . Heel spur, left 02/07/2020  . Hyperlipidemia 03/07/2018  . Hypothyroidism (acquired) 03/07/2018   Formatting of this note might be different from the original. 1990s.  . Mixed hyperlipidemia 03/07/2018  . Obesity   . Ocular myasthenia gravis (HCC) 07/17/2020   Formatting of this note might be different from the original. Ocular.  Follows at [facility].  Sees neurology and eye doctor.  . Palpitation 03/08/2018  . Plantar fasciitis 02/07/2020  . Pneumonia   . Primary osteoarthritis involving multiple joints 07/17/2020  . Recurrent sinusitis   . Smokeless tobacco use 07/17/2020   Formatting of this note might be different from the original. Discussed chantix.  He wants to use. Did well in past. Off label, he understands.  . Tightness of heel cord, left 02/07/2020  . Type 2  diabetes mellitus (HCC) 03/07/2018  . Type 2 diabetes mellitus without complication, without long-term current use of insulin (HCC) 03/07/2018   Formatting of this note might be different from the original. 2014.    Past Surgical History:  Procedure Laterality Date  . CATARACT EXTRACTION W/ INTRAOCULAR LENS  IMPLANT, BILATERAL    . COLONOSCOPY W/ POLYPECTOMY  11/27/2012   Colonic polyp, status post polypectomy. Mild sigmoid diverticulosis.  . CORONARY ARTERY BYPASS GRAFT N/A 10/27/2020   Procedure: CORONARY ARTERY BYPASS GRAFTING (CABG), ON PUMP, TIMES FOUR, USING LEFT  INTERNAL MAMMARY ARTERY AND ENDOSCOPICALLY HARVESTED RIGHT GREATER SAPHENOUS VEIN;  Surgeon: [name], MD;  Location: [facility] OR;  Service: Open Heart Surgery;  Laterality: N/A;  . detatched retina    . ESOPHAGOGASTRODUODENOSCOPY  04/17/2007   Small hiatal hernia. Mild gastritis. Otherwise normal EGD  . kidney stone extraction     cystoscopy x 2  . KNEE SURGERY Left    bone spurs  . LEFT HEART CATH AND CORONARY ANGIOGRAPHY N/A 10/08/2020   Procedure: LEFT HEART CATH AND CORONARY ANGIOGRAPHY;  Surgeon: Marykay Lex, MD;  Location: [facility] INVASIVE CV LAB;  Service: Cardiovascular;  Laterality: N/A;  . NOSE SURGERY    . TEE WITHOUT CARDIOVERSION N/A 10/27/2020   Procedure: TRANSESOPHAGEAL ECHOCARDIOGRAM (TEE);  Surgeon: [name], MD;  Location: [facility] OR;  Service: Open Heart Surgery;  Laterality: N/A;  . TEE WITHOUT CARDIOVERSION N/A 01/08/2021   Procedure: TRANSESOPHAGEAL ECHOCARDIOGRAM (TEE);  Surgeon: Lars Masson, MD;  Location: [facility] ENDOSCOPY;  Service: Cardiovascular;  Laterality: N/A;    Family History  Problem Relation Age of Onset  . Rheum arthritis Mother   . Heart Problems Mother   . Cancer Father   . Hypertension Father   . CAD Father   . Blindness Father   . Colon cancer Neg Hx   . Esophageal cancer Neg Hx   . Rectal cancer Neg Hx   . Stomach cancer Neg Hx     Social History   Tobacco Use  . Smoking status: Former Smoker    Types: Cigars, Pipe    Quit date: 11/30/1999    Years since quitting: 21.1  . Smokeless tobacco: Current User    Types: Snuff  . Tobacco comment: stopped smoking pipe tobacco 30 years ago  Vaping Use  . Vaping Use: Never used  Substance Use Topics  . Alcohol use: Yes    Comment: Occasional Beer   . Drug use: Never    Current Outpatient Medications  Medication Sig Dispense Refill  . acetaminophen (TYLENOL) 500 MG tablet Take 500-1,000 mg by mouth every 6 (six) hours as needed (for pain.).    Marland Kitchen albuterol (PROVENTIL) (2.5 MG/3ML)  0.083% nebulizer solution Take 2.5 mg by nebulization every 6 (six) hours as needed.    Marland Kitchen albuterol (VENTOLIN HFA) 108 (90 Base) MCG/ACT inhaler Inhale into the lungs every 6 (six) hours as needed for wheezing or shortness of breath.    Marland Kitchen apixaban (ELIQUIS) 5 MG TABS tablet Take 1 tablet (5 mg total) by mouth 2 (two) times daily. 180 tablet 3  . aspirin EC 81 MG tablet Take 1 tablet (81 mg total) by mouth daily. Swallow whole. 90 tablet 3  . Dextran 70-Hypromellose, PF, (ARTIFICIAL TEARS PF) 0.1-0.3 % SOLN Place 1 drop into both eyes 3 (three) times daily as needed (dry/irritated eyes.).    Marland Kitchen levothyroxine (SYNTHROID) 75 MCG tablet Take 75 mcg by mouth daily at 6 (six) AM.    .  metFORMIN (GLUCOPHAGE) 1000 MG tablet Take 1,000 mg by mouth 2 (two) times daily with a meal.    . Omega-3 Fatty Acids (FISH OIL ULTRA) 1400 MG CAPS Take 1,400 mg by mouth in the morning and at bedtime.    . pantoprazole (PROTONIX) 40 MG tablet Take 40 mg by mouth daily.    Marland Kitchen PARoxetine (PAXIL) 40 MG tablet Take 40 mg by mouth daily.    Marland Kitchen pyridostigmine (MESTINON) 60 MG tablet Take 60 mg by mouth 4 (four) times daily.    . Semaglutide,0.25 or 0.5MG /DOS, (OZEMPIC, 0.25 OR 0.5 MG/DOSE,) 2 MG/1.5ML SOPN Inject 0.5 mg into the skin every Wednesday.     . vitamin B-12 (CYANOCOBALAMIN) 1000 MCG tablet Take 1,000 mcg by mouth daily.     No current facility-administered medications for this visit.    Allergies  Allergen Reactions  . Codeine Itching  . Hydrocodone-Acetaminophen Hives and Itching  . Lipitor [Atorvastatin] Other (See Comments)    Aches and pains  . Latex Itching    Review of Systems:  neg     Physical Exam:    Pulse 68   Ht 5\' 6"  (1.676 m)   Wt 196 lb 2 oz (89 kg)   BMI 31.66 kg/m  Wt Readings from Last 3 Encounters:  01/29/21 196 lb 2 oz (89 kg)  01/08/21 198 lb (89.8 kg)  01/02/21 198 lb (89.8 kg)   Constitutional:  Well-developed, in no acute distress. Psychiatric: Normal mood and affect.  Behavior is normal. HEENT: Pupils normal.  Conjunctivae are normal. No scleral icterus. Cardiovascular: Normal rate, regular rhythm. No edema Pulmonary/chest: Effort normal and breath sounds normal. No wheezing, rales or rhonchi. Abdominal: Soft, nondistended. Nontender. Bowel sounds active throughout. There are no masses palpable. No hepatomegaly. Rectal: Deferred Neurological: Alert and oriented to person place and time. Skin: Skin is warm and dry. No rashes noted.  Data Reviewed: I have personally reviewed following labs and imaging studies  CBC: CBC Latest Ref Rng & Units 01/02/2021 12/19/2020 10/31/2020  WBC 3.4 - 10.8 x10E3/uL 5.7 5.2 6.8  Hemoglobin 13.0 - 17.7 g/dL 14/01/2020 40.9 10.1(L)  Hematocrit 37.5 - 51.0 % 42.0 40.8 29.0(L)  Platelets 150 - 450 x10E3/uL 221 196 108(L)    CMP: CMP Latest Ref Rng & Units 01/02/2021 12/19/2020 11/11/2020  Glucose 65 - 99 mg/dL 11/13/2020) 914(N) 829(F)  BUN 8 - 27 mg/dL 20 16 18   Creatinine 0.76 - 1.27 mg/dL 621(H 0.86)  Sodium 134 - 144 mmol/L 139 137 137  Potassium 3.5 - 5.2 mmol/L 4.2 4.0 4.7  Chloride 96 - 106 mmol/L 100 103 102  CO2 20 - 29 mmol/L 21 21 18(L)  Calcium 8.6 - 10.2 mg/dL 9.3 9.2 9.5  Total Protein 6.5 - 8.1 g/dL - - -  Total Bilirubin 0.3 - 1.2 mg/dL - - -  Alkaline Phos 38 - 126 U/L - - -  AST 15 - 41 U/L - - -  ALT 0 - 44 U/L - - -      Radiology Studies: ECHO TEE  Result Date: 01/08/2021    TRANSESOPHOGEAL ECHO REPORT   Patient Name:   ZACHAREE GADDIE Date of Exam: 01/08/2021 Medical Rec #:  Lujean Amel         Height:       66.0 in Accession #:    03/08/2021        Weight:       198.0 lb Date of Birth:  December 20, 1955  BSA:          1.991 m Patient Age:    64 years          BP:           126/81 mmHg Patient Gender: M                 HR:           52 bpm. Exam Location:  Outpatient Procedure: 3D Echo, Transesophageal Echo, Color Doppler and Cardiac Doppler Indications:     I48.0 Paroxysmal atrial fibrillation   History:         Patient has prior history of Echocardiogram examinations, most                  recent 11/12/2020. CAD; Risk Factors:Diabetes, Dyslipidemia and                  Former Smoker. COVID-19. GERD. Hypothyroidism.  Sonographer:     Elmarie Shiley Dance Referring Phys:  [id number] Baldo Daub Diagnosing Phys: Tobias Alexander MD PROCEDURE: The transesophogeal probe was passed without difficulty through the esophogus of the patient. Sedation performed by different physician. The patient was monitored while under deep sedation. Anesthestetic sedation was provided intravenously by Anesthesiology:  of Propofol. The patient's vital signs; including heart rate, blood pressure, and oxygen saturation; remained stable throughout the procedure. The patient developed no complications during the procedure. IMPRESSIONS  1. Systemic anticoagulation is recommended.  2. There seems to be an old formed thrombus attached to the pectinate muscles in the left atrial appendage.  3. Left ventricular ejection fraction, by estimation, is 55 to 60%. The left ventricle has normal function. The left ventricle has no regional wall motion abnormalities. There is mild concentric left ventricular hypertrophy. Left ventricular diastolic function could not be evaluated.  4. Right ventricular systolic function is normal. The right ventricular size is normal.  5. Left atrial size was mildly dilated. A left atrial/left atrial appendage thrombus was detected. The LAA emptying velocity was 80 cm/s.  6. The mitral valve is normal in structure. Mild mitral valve regurgitation. No evidence of mitral stenosis.  7. The aortic valve is normal in structure. Aortic valve regurgitation is not visualized. No aortic stenosis is present.  8. There is mild (Grade II) plaque.  9. The inferior vena cava is normal in size with greater than 50% respiratory variability, suggesting right atrial pressure of 3 mmHg. Conclusion(s)/Recommendation(s): Normal  biventricular function without evidence of hemodynamically significant valvular heart disease. FINDINGS  Left Ventricle: Left ventricular ejection fraction, by estimation, is 55 to 60%. The left ventricle has normal function. The left ventricle has no regional wall motion abnormalities. The left ventricular internal cavity size was normal in size. There is  mild concentric left ventricular hypertrophy. Left ventricular diastolic function could not be evaluated. Right Ventricle: The right ventricular size is normal. No increase in right ventricular wall thickness. Right ventricular systolic function is normal. Left Atrium: Left atrial size was mildly dilated. A left atrial/left atrial appendage thrombus was detected. The LAA emptying velocity was 80 cm/s. Right Atrium: Right atrial size was normal in size. Prominent Crista terminalis. Pericardium: There is no evidence of pericardial effusion. Mitral Valve: The mitral valve is normal in structure. Mild mitral valve regurgitation. No evidence of mitral valve stenosis. Tricuspid Valve: The tricuspid valve is normal in structure. Tricuspid valve regurgitation is trivial. No evidence of tricuspid stenosis. Aortic Valve: The aortic valve is normal in structure. Aortic valve regurgitation  is not visualized. No aortic stenosis is present. Pulmonic Valve: The pulmonic valve was normal in structure. Pulmonic valve regurgitation is not visualized. No evidence of pulmonic stenosis. Aorta: The aortic root is normal in size and structure. There is mild (Grade II) plaque. Venous: The inferior vena cava is normal in size with greater than 50% respiratory variability, suggesting right atrial pressure of 3 mmHg. IAS/Shunts: No atrial level shunt detected by color flow Doppler. Tobias Alexander MD Electronically signed by Tobias Alexander MD Signature Date/Time: 01/08/2021/11:01:20 AM    Final       Edman Circle, MD 01/29/2021, 10:37 AM  Cc: Gordan Payment., MD

## 2021-01-30 ENCOUNTER — Ambulatory Visit (INDEPENDENT_AMBULATORY_CARE_PROVIDER_SITE_OTHER): Payer: Commercial Managed Care - PPO | Admitting: Cardiology

## 2021-01-30 ENCOUNTER — Other Ambulatory Visit: Payer: Self-pay

## 2021-01-30 ENCOUNTER — Encounter: Payer: Self-pay | Admitting: Cardiology

## 2021-01-30 VITALS — BP 100/60 | HR 60 | Ht 60.0 in | Wt 196.0 lb

## 2021-01-30 DIAGNOSIS — E119 Type 2 diabetes mellitus without complications: Secondary | ICD-10-CM

## 2021-01-30 DIAGNOSIS — I513 Intracardiac thrombosis, not elsewhere classified: Secondary | ICD-10-CM | POA: Diagnosis not present

## 2021-01-30 DIAGNOSIS — I951 Orthostatic hypotension: Secondary | ICD-10-CM | POA: Diagnosis not present

## 2021-01-30 DIAGNOSIS — I2510 Atherosclerotic heart disease of native coronary artery without angina pectoris: Secondary | ICD-10-CM

## 2021-01-30 NOTE — Patient Instructions (Signed)

## 2021-01-30 NOTE — Progress Notes (Signed)
Cardiology Office Note:    Date:  01/30/2021   ID:  Guinness, Goelz 1956/08/01, MRN [id number]  PCP:  Gordan Payment., MD  Cardiologist:  Thomasene Ripple, DO  Electrophysiologist:  None   Referring MD: Gordan Payment., MD   Chief Complaint  Patient presents with  . Follow-up   History of Present Illness:    Derek Moore is a 65 y.o. male with a hx of coronary artery disease status post CABG, paroxysmal atrial fibrillation which is postoperative however the patient was found to have left atrial appendage clot and is now on apixaban 5 mg twice a day, hyperlipidemia, hypertension.  The patient follows with Dr. Dulce Sellar and was last seen by him on January 03, 2020 at that time they discussed pursuing TEE.  He still was showing symptoms of orthostatic hypotension and SGL 2 inhibitors was stopped at that time.  The patient was able to get his TEE on January 08, 2021 which confirmed left atrial clot.  Today he offers no complaints.  He says that he has been doing much better.  He does not have any lightheadedness or dizziness.  He is requesting to stop his Eliquis for 14 days given the fact that he needs to get a skin procedure.  I did speak to the patient that I would prefer that he discuss this with his primary cardiologist Dr. Dulce Sellar as personally I was not comfortable in stopping his Eliquis for that long.  Past Medical History:  Diagnosis Date  . Abnormal EKG 03/09/2018  . Acquired hypothyroidism 03/07/2018  . Allergy   . Aortic atherosclerosis (HCC) 07/17/2020   Formatting of this note might be different from the original. Seen on CT [facility] 06/2019  . Arthritis   . Asthma   . Cataract   . Chronic gout without tophus 03/07/2018  . Coronary artery calcification seen on CAT scan 07/17/2020   Formatting of this note might be different from the original. 06/2019. [facility]  . Cough syncope 1999  . Dental caries   . Depression 03/07/2018  . Diplopia 10/11/2018  . DISH (diffuse idiopathic skeletal  hyperostosis) 07/17/2020   Formatting of this note might be different from the original. Seen on CT [facility] 06/2019  . Dizzy 05/09/2018  . Gastroesophageal reflux disease without esophagitis 03/07/2018   Formatting of this note might be different from the original. Long term  . GERD (gastroesophageal reflux disease) 03/07/2018  . Gout 03/07/2018  . Hay fever   . Heel spur, left 02/07/2020  . Hyperlipidemia 03/07/2018  . Hypothyroidism (acquired) 03/07/2018   Formatting of this note might be different from the original. 1990s.  . Mixed hyperlipidemia 03/07/2018  . Obesity   . Ocular myasthenia gravis (HCC) 07/17/2020   Formatting of this note might be different from the original. Ocular.  Follows at [facility].  Sees neurology and eye doctor.  . Palpitation 03/08/2018  . Plantar fasciitis 02/07/2020  . Pneumonia   . Primary osteoarthritis involving multiple joints 07/17/2020  . Recurrent sinusitis   . Smokeless tobacco use 07/17/2020   Formatting of this note might be different from the original. Discussed chantix.  He wants to use. Did well in past. Off label, he understands.  . Tightness of heel cord, left 02/07/2020  . Type 2 diabetes mellitus (HCC) 03/07/2018  . Type 2 diabetes mellitus without complication, without long-term current use of insulin (HCC) 03/07/2018   Formatting of this note might be different from the original. 2014.  Past Surgical History:  Procedure Laterality Date  . CATARACT EXTRACTION W/ INTRAOCULAR LENS  IMPLANT, BILATERAL    . COLONOSCOPY W/ POLYPECTOMY  11/27/2012   Colonic polyp, status post polypectomy. Mild sigmoid diverticulosis.  . CORONARY ARTERY BYPASS GRAFT N/A 10/27/2020   Procedure: CORONARY ARTERY BYPASS GRAFTING (CABG), ON PUMP, TIMES FOUR, USING LEFT INTERNAL MAMMARY ARTERY AND ENDOSCOPICALLY HARVESTED RIGHT GREATER SAPHENOUS VEIN;  Surgeon: [name], MD;  Location: [facility] OR;  Service: Open Heart Surgery;  Laterality: N/A;  . detatched retina    .  ESOPHAGOGASTRODUODENOSCOPY  04/17/2007   Small hiatal hernia. Mild gastritis. Otherwise normal EGD  . kidney stone extraction     cystoscopy x 2  . KNEE SURGERY Left    bone spurs  . LEFT HEART CATH AND CORONARY ANGIOGRAPHY N/A 10/08/2020   Procedure: LEFT HEART CATH AND CORONARY ANGIOGRAPHY;  Surgeon: Marykay Lex, MD;  Location: [facility] INVASIVE CV LAB;  Service: Cardiovascular;  Laterality: N/A;  . NOSE SURGERY    . TEE WITHOUT CARDIOVERSION N/A 10/27/2020   Procedure: TRANSESOPHAGEAL ECHOCARDIOGRAM (TEE);  Surgeon: [name], MD;  Location: [facility] OR;  Service: Open Heart Surgery;  Laterality: N/A;  . TEE WITHOUT CARDIOVERSION N/A 01/08/2021   Procedure: TRANSESOPHAGEAL ECHOCARDIOGRAM (TEE);  Surgeon: Lars Masson, MD;  Location: [facility] ENDOSCOPY;  Service: Cardiovascular;  Laterality: N/A;    Current Medications: Current Meds  Medication Sig  . acetaminophen (TYLENOL) 500 MG tablet Take 500-1,000 mg by mouth every 6 (six) hours as needed (for pain.).  Marland Kitchen albuterol (PROVENTIL) (2.5 MG/3ML) 0.083% nebulizer solution Take 2.5 mg by nebulization every 6 (six) hours as needed.  Marland Kitchen albuterol (VENTOLIN HFA) 108 (90 Base) MCG/ACT inhaler Inhale into the lungs every 6 (six) hours as needed for wheezing or shortness of breath.  Marland Kitchen apixaban (ELIQUIS) 5 MG TABS tablet Take 1 tablet (5 mg total) by mouth 2 (two) times daily.  Marland Kitchen aspirin EC 81 MG tablet Take 1 tablet (81 mg total) by mouth daily. Swallow whole.  Marland Kitchen Dextran 70-Hypromellose, PF, (ARTIFICIAL TEARS PF) 0.1-0.3 % SOLN Place 1 drop into both eyes 3 (three) times daily as needed (dry/irritated eyes.).  Marland Kitchen levothyroxine (SYNTHROID) 88 MCG tablet Take 88 mcg by mouth daily.  . metFORMIN (GLUCOPHAGE) 1000 MG tablet Take 1,000 mg by mouth 2 (two) times daily with a meal.  . Omega-3 Fatty Acids (FISH OIL ULTRA) 1400 MG CAPS Take 1,400 mg by mouth in the morning and at bedtime.  . pantoprazole (PROTONIX) 40 MG tablet Take 40 mg by mouth daily.  Marland Kitchen  PARoxetine (PAXIL) 40 MG tablet Take 40 mg by mouth daily.  Marland Kitchen pyridostigmine (MESTINON) 60 MG tablet Take 60 mg by mouth 4 (four) times daily.  . Semaglutide,0.25 or 0.5MG /DOS, (OZEMPIC, 0.25 OR 0.5 MG/DOSE,) 2 MG/1.5ML SOPN Inject 0.5 mg into the skin every Wednesday.   . vitamin B-12 (CYANOCOBALAMIN) 1000 MCG tablet Take 1,000 mcg by mouth daily.     Allergies:   Codeine, Hydrocodone-acetaminophen, Lipitor [atorvastatin], and Latex   Social History   Socioeconomic History  . Marital status: Married    Spouse name: Selena Batten  . Number of children: 3  . Years of education: Not on file  . Highest education level: Not on file  Occupational History  . Occupation: retired  Tobacco Use  . Smoking status: Former Smoker    Types: Cigars, Pipe    Quit date: 11/30/1999    Years since quitting: 21.1  . Smokeless tobacco: Current User  Types: Snuff  . Tobacco comment: stopped smoking pipe tobacco 30 years ago  Vaping Use  . Vaping Use: Never used  Substance and Sexual Activity  . Alcohol use: Yes    Comment: Occasional Beer   . Drug use: Never  . Sexual activity: Not on file  Other Topics Concern  . Not on file  Social History Narrative  . Not on file   Social Determinants of Health   Financial Resource Strain: Not on file  Food Insecurity: Not on file  Transportation Needs: Not on file  Physical Activity: Not on file  Stress: Not on file  Social Connections: Not on file     Family History: The patient's family history includes Blindness in his father; CAD in his father; Cancer in his father; Heart Problems in his mother; Hypertension in his father; Rheum arthritis in his mother. There is no history of Colon cancer, Esophageal cancer, Rectal cancer, or Stomach cancer.  ROS:   Review of Systems  Constitution: Negative for decreased appetite, fever and weight gain.  HENT: Negative for congestion, ear discharge, hoarse voice and sore throat.   Eyes: Negative for discharge, redness,  vision loss in right eye and visual halos.  Cardiovascular: Negative for chest pain, dyspnea on exertion, leg swelling, orthopnea and palpitations.  Respiratory: Negative for cough, hemoptysis, shortness of breath and snoring.   Endocrine: Negative for heat intolerance and polyphagia.  Hematologic/Lymphatic: Negative for bleeding problem. Does not bruise/bleed easily.  Skin: Negative for flushing, nail changes, rash and suspicious lesions.  Musculoskeletal: Negative for arthritis, joint pain, muscle cramps, myalgias, neck pain and stiffness.  Gastrointestinal: Negative for abdominal pain, bowel incontinence, diarrhea and excessive appetite.  Genitourinary: Negative for decreased libido, genital sores and incomplete emptying.  Neurological: Negative for brief paralysis, focal weakness, headaches and loss of balance.  Psychiatric/Behavioral: Negative for altered mental status, depression and suicidal ideas.  Allergic/Immunologic: Negative for HIV exposure and persistent infections.    EKGs/Labs/Other Studies Reviewed:    The following studies were reviewed today:   EKG: None today  Transesophageal echocardiogram January 08, 2021 IMPRESSIONS    1. Systemic anticoagulation is recommended.  2. There seems to be an old formed thrombus attached to the pectinate  muscles in the left atrial appendage.  3. Left ventricular ejection fraction, by estimation, is 55 to 60%. The  left ventricle has normal function. The left ventricle has no regional  wall motion abnormalities. There is mild concentric left ventricular  hypertrophy. Left ventricular diastolic  function could not be evaluated.  4. Right ventricular systolic function is normal. The right ventricular  size is normal.  5. Left atrial size was mildly dilated. A left atrial/left atrial  appendage thrombus was detected. The LAA emptying velocity was 80 cm/s.  6. The mitral valve is normal in structure. Mild mitral valve   regurgitation. No evidence of mitral stenosis.  7. The aortic valve is normal in structure. Aortic valve regurgitation is  not visualized. No aortic stenosis is present.  8. There is mild (Grade II) plaque.  9. The inferior vena cava is normal in size with greater than 50%  respiratory variability, suggesting right atrial pressure of 3 mmHg.   Recent Labs: 10/24/2020: ALT 21 11/11/2020: NT-Pro BNP 247; TSH 29.200 12/19/2020: Magnesium 1.8 01/02/2021: BUN 20; Creatinine, Ser 1.17; Hemoglobin 13.6; Platelets 221; Potassium 4.2; Sodium 139  Recent Lipid Panel No results found for: CHOL, TRIG, HDL, CHOLHDL, VLDL, LDLCALC, LDLDIRECT  Physical Exam:    VS:  BP 100/60 (BP Location: Left Arm, Patient Position: Sitting, Cuff Size: Normal)   Pulse 60   Ht 5' (1.524 m)   Wt 196 lb (88.9 kg)   SpO2 98%   BMI 38.28 kg/m     Wt Readings from Last 3 Encounters:  01/30/21 196 lb (88.9 kg)  01/29/21 196 lb 2 oz (89 kg)  01/08/21 198 lb (89.8 kg)     GEN: Well nourished, well developed in no acute distress HEENT: Normal NECK: No JVD; No carotid bruits LYMPHATICS: No lymphadenopathy CARDIAC: S1S2 noted,RRR, no murmurs, rubs, gallops RESPIRATORY:  Clear to auscultation without rales, wheezing or rhonchi  ABDOMEN: Soft, non-tender, non-distended, +bowel sounds, no guarding. EXTREMITIES: No edema, No cyanosis, no clubbing MUSCULOSKELETAL:  No deformity  SKIN: Warm and dry NEUROLOGIC:  Alert and oriented x 3, non-focal PSYCHIATRIC:  Normal affect, good insight  ASSESSMENT:    1. Coronary artery disease involving native coronary artery of native heart without angina pectoris   2. Orthostatic hypotension   3. Type 2 diabetes mellitus without complication, without long-term current use of insulin (HCC)   4. Thrombus of left atrial appendage    PLAN:      He is on Eliquis for his left atrial thrombus we will continue this for now.  He had requested to stop his Eliquis for 14 days giving  a skin procedure that is pending, educated the patient that I will be okay with stopping 4 doses of his Eliquis prior to his procedure but for 14 daysI prefer the patient discussed with Dr. Dulce Sellar his primary cardiologist about this.  I will forward my note to Dr. Dulce Sellar for his input to the patient.   In terms of his orthostatic hypotension he is not symptomatic.  No anginal symptoms.  The patient is in agreement with the above plan. The patient left the office in stable condition.  The patient will follow up in 3 months with Dr. Dulce Sellar.   Medication Adjustments/Labs and Tests Ordered: Current medicines are reviewed at length with the patient today.  Concerns regarding medicines are outlined above.  No orders of the defined types were placed in this encounter.  No orders of the defined types were placed in this encounter.   Patient Instructions  Medication Instructions:  Your physician recommends that you continue on your current medications as directed. Please refer to the Current Medication list given to you today.  *If you need a refill on your cardiac medications before your next appointment, please call your pharmacy*   Lab Work: None If you have labs (blood work) drawn today and your tests are completely normal, you will receive your results only by: Marland Kitchen MyChart Message (if you have MyChart) OR . A paper copy in the mail If you have any lab test that is abnormal or we need to change your treatment, we will call you to review the results.   Testing/Procedures: None   Follow-Up: At [facility], you and your health needs are our priority.  As part of our continuing mission to provide you with exceptional heart care, we have created designated Provider Care Teams.  These Care Teams include your primary Cardiologist (physician) and Advanced Practice Providers (APPs -  Physician Assistants and Nurse Practitioners) who all work together to provide you with the care you need,  when you need it.  We recommend signing up for the patient portal called "MyChart".  Sign up information is provided on this After Visit Summary.  MyChart is used  to connect with patients for Virtual Visits (Telemedicine).  Patients are able to view lab/test results, encounter notes, upcoming appointments, etc.  Non-urgent messages can be sent to your provider as well.   To learn more about what you can do with MyChart, go to [email or web address].    Your next appointment:   3 month(s)  The format for your next appointment:   In Person  Provider:   Norman Herrlich, MD   Other Instructions      Adopting a Healthy Lifestyle.  Know what a healthy weight is for you (roughly BMI <25) and aim to maintain this   Aim for 7+ servings of fruits and vegetables daily   65-80+ fluid ounces of water or unsweet tea for healthy kidneys   Limit to max 1 drink of alcohol per day; avoid smoking/tobacco   Limit animal fats in diet for cholesterol and heart health - choose grass fed whenever available   Avoid highly processed foods, and foods high in saturated/trans fats   Aim for low stress - take time to unwind and care for your mental health   Aim for 150 min of moderate intensity exercise weekly for heart health, and weights twice weekly for bone health   Aim for 7-9 hours of sleep daily   When it comes to diets, agreement about the perfect plan isnt easy to find, even among the experts. Experts at the [facility] of [employer] developed an idea known as the Healthy Eating Plate. Just imagine a plate divided into logical, healthy portions.   The emphasis is on diet quality:   Load up on vegetables and fruits - one-half of your plate: Aim for color and variety, and remember that potatoes dont count.   Go for whole grains - one-quarter of your plate: Whole wheat, barley, wheat berries, quinoa, oats, brown rice, and foods made with them. If you want pasta, go with whole wheat  pasta.   Protein power - one-quarter of your plate: Fish, chicken, beans, and nuts are all healthy, versatile protein sources. Limit red meat.   The diet, however, does go beyond the plate, offering a few other suggestions.   Use healthy plant oils, such as olive, canola, soy, corn, sunflower and peanut. Check the labels, and avoid partially hydrogenated oil, which have unhealthy trans fats.   If youre thirsty, drink water. Coffee and tea are good in moderation, but skip sugary drinks and limit milk and dairy products to one or two daily servings.   The type of carbohydrate in the diet is more important than the amount. Some sources of carbohydrates, such as vegetables, fruits, whole grains, and beans-are healthier than others.   Finally, stay active  Signed, Thomasene Ripple, DO  01/30/2021 8:33 AM    [facility]

## 2021-02-02 ENCOUNTER — Telehealth: Payer: Self-pay

## 2021-02-02 NOTE — Telephone Encounter (Signed)
-----   Message from Baldo Daub, MD sent at 02/02/2021  4:48 PM EST ----- Regarding: RE: Very uncommon but can stop 14 days Who is the surgeon and what is the procedure? ----- Message ----- From: Thomasene Ripple, DO Sent: 01/30/2021   8:34 AM EST To: Baldo Daub, MD  Hello Dr. Dulce Sellar, I did see Mr. Sahakian in the office today.  I defer to you about stopping his Eliquis for 14 days as this is what he wishes to do for a skin procedure.

## 2021-02-02 NOTE — Telephone Encounter (Signed)
Spoke to the patient just now and let him know that he can hold this Eliquis for the two weeks prior. He states that he does not know the physicians name or the exact procedure. He also does not know the name of the office for me to call them or look them up. He states that he will call the office when he is able to and will get back to [place] with the name of the physician and the office.

## 2021-02-03 ENCOUNTER — Telehealth: Payer: Self-pay | Admitting: Cardiology

## 2021-02-03 NOTE — Telephone Encounter (Signed)
Patient called back this morning. This is the information that I received:   Dr Susann Givens- with [facility] in [place]- office right beside your office- knot removed from his right arm-  It looks like this is Dr. Derenda Fennel with [facility]. Patient states he is having a "knot" removed from his arm. He is unsure what exactly  this knot is.

## 2021-02-05 ENCOUNTER — Other Ambulatory Visit: Payer: Self-pay

## 2021-02-05 ENCOUNTER — Ambulatory Visit (HOSPITAL_COMMUNITY)
Admission: RE | Admit: 2021-02-05 | Discharge: 2021-02-05 | Disposition: A | Discharge status: Home / Self Care | Payer: Commercial Managed Care - PPO | Source: Ambulatory Visit | Attending: Gastroenterology | Admitting: Gastroenterology

## 2021-02-05 DIAGNOSIS — R1319 Other dysphagia: Secondary | ICD-10-CM

## 2021-02-12 ENCOUNTER — Telehealth: Payer: Self-pay | Admitting: *Deleted

## 2021-02-12 DIAGNOSIS — Z006 Encounter for examination for normal comparison and control in clinical research program: Secondary | ICD-10-CM

## 2021-02-12 NOTE — Telephone Encounter (Signed)
I called patient for 90-day phone call for Identify study. Patient stated he is feeling well after surgery and having no cardiac symptoms. I will call patient in November for 1 year study follow-up.

## 2021-04-14 ENCOUNTER — Ambulatory Visit: Payer: Medicare Other | Admitting: Cardiology

## 2021-05-05 NOTE — Progress Notes (Signed)
Cardiology Office Note:    Date:  05/06/2021   ID:  Derek, Moore Feb 05, 1956, MRN [id number]  PCP:  Gordan Payment., MD  Cardiologist:  Norman Herrlich, MD    Referring MD: Gordan Payment., MD    ASSESSMENT:    1. Coronary artery disease involving native coronary artery of native heart without angina pectoris   2. S/P CABG x 4   3. PAF (paroxysmal atrial fibrillation) (HCC)   4. Thrombus of left atrial appendage   5. Mixed hyperlipidemia   6. Type 2 diabetes mellitus without complication, without long-term current use of insulin (HCC)   7. Chronic anticoagulation    PLAN:    In order of problems listed above:  1. He continues to do well following bypass surgery asymptomatic New York Heart Association class I he takes low-dose aspirin and Eliquis and resume lipid-lowering therapy rosuvastatin follow-up with profile in 4 weeks. 2. Strongly encouraged to remain anticoagulated with Eliquis 3. Poorly controlled hyperlipidemia resume high intensity statin 6 weeks check lipid profile 4. Stable managed by his PCP 5. Hypothyroid on thyroid supplement stable   Next appointment: 6 months   Medication Adjustments/Labs and Tests Ordered: Current medicines are reviewed at length with the patient today.  Concerns regarding medicines are outlined above.  No orders of the defined types were placed in this encounter.  No orders of the defined types were placed in this encounter.   Chief Complaint  Patient presents with  . Follow-up  . Coronary Artery Disease  Derek Moore is a 65 year old man with a history of CAD he had CABG November 2021 complicated by postoperative atrial fibrillation and left atrial appendage thrombus.  He is anticoagulated.  He was last seen 01/30/2021.  He was intolerant of atorvastatin with muscle pain and weakness but tolerated rosuvastatin in the past to restart check a lipid profile in 2 months he would like to be off anticoagulation but after discussion of risk  and benefits were remain on Eliquis.  I would only withdraw it if he underwent a repeat TEE prior to the discontinuation.  He has had no edema shortness of breath chest pain or syncope.  Recent labs 03/26/2021 TSH 24.14 01/27/2021 CMP normal potassium 4.6 creatinine 1.10 GFR 74 cc Cholesterol 235 LDL 148 HDL 69 HDL cholesterol 175 Past Medical History:  Diagnosis Date  . Abnormal EKG 03/09/2018  . Acquired hypothyroidism 03/07/2018  . Allergy   . Aortic atherosclerosis (HCC) 07/17/2020   Formatting of this note might be different from the original. Seen on [facility] 06/2019  . Arthritis   . Asthma   . Cataract   . Chronic gout without tophus 03/07/2018  . Coronary artery calcification seen on CAT scan 07/17/2020   Formatting of this note might be different from the original. 06/2019. [facility]  . Cough syncope 1999  . Dental caries   . Depression 03/07/2018  . Diplopia 10/11/2018  . DISH (diffuse idiopathic skeletal hyperostosis) 07/17/2020   Formatting of this note might be different from the original. Seen on CT [facility] 06/2019  . Dizzy 05/09/2018  . Gastroesophageal reflux disease without esophagitis 03/07/2018   Formatting of this note might be different from the original. Long term  . GERD (gastroesophageal reflux disease) 03/07/2018  . Gout 03/07/2018  . Hay fever   . Heel spur, left 02/07/2020  . Hyperlipidemia 03/07/2018  . Hypothyroidism (acquired) 03/07/2018   Formatting of this note might be different from the original. 1990s.  . Mixed hyperlipidemia  03/07/2018  . Obesity   . Ocular myasthenia gravis (HCC) 07/17/2020   Formatting of this note might be different from the original. Ocular.  Follows at [facility].  Sees neurology and eye doctor.  . Palpitation 03/08/2018  . Plantar fasciitis 02/07/2020  . Pneumonia   . Primary osteoarthritis involving multiple joints 07/17/2020  . Recurrent sinusitis   . Smokeless tobacco use 07/17/2020   Formatting of this note might be different from the original. Discussed  chantix.  He wants to use. Did well in past. Off label, he understands.  . Tightness of heel cord, left 02/07/2020  . Type 2 diabetes mellitus (HCC) 03/07/2018  . Type 2 diabetes mellitus without complication, without long-term current use of insulin (HCC) 03/07/2018   Formatting of this note might be different from the original. 2014.    Past Surgical History:  Procedure Laterality Date  . CATARACT EXTRACTION W/ INTRAOCULAR LENS  IMPLANT, BILATERAL    . COLONOSCOPY W/ POLYPECTOMY  11/27/2012   Colonic polyp, status post polypectomy. Mild sigmoid diverticulosis.  . CORONARY ARTERY BYPASS GRAFT N/A 10/27/2020   Procedure: CORONARY ARTERY BYPASS GRAFTING (CABG), ON PUMP, TIMES FOUR, USING LEFT INTERNAL MAMMARY ARTERY AND ENDOSCOPICALLY HARVESTED RIGHT GREATER SAPHENOUS VEIN;  Surgeon: [name], MD;  Location: [facility] OR;  Service: Open Heart Surgery;  Laterality: N/A;  . detatched retina    . ESOPHAGOGASTRODUODENOSCOPY  04/17/2007   Small hiatal hernia. Mild gastritis. Otherwise normal EGD  . kidney stone extraction     cystoscopy x 2  . KNEE SURGERY Left    bone spurs  . LEFT HEART CATH AND CORONARY ANGIOGRAPHY N/A 10/08/2020   Procedure: LEFT HEART CATH AND CORONARY ANGIOGRAPHY;  Surgeon: Marykay Lex, MD;  Location: [facility] INVASIVE CV LAB;  Service: Cardiovascular;  Laterality: N/A;  . NOSE SURGERY    . TEE WITHOUT CARDIOVERSION N/A 10/27/2020   Procedure: TRANSESOPHAGEAL ECHOCARDIOGRAM (TEE);  Surgeon: [name], MD;  Location: [facility] OR;  Service: Open Heart Surgery;  Laterality: N/A;  . TEE WITHOUT CARDIOVERSION N/A 01/08/2021   Procedure: TRANSESOPHAGEAL ECHOCARDIOGRAM (TEE);  Surgeon: Lars Masson, MD;  Location: [facility] ENDOSCOPY;  Service: Cardiovascular;  Laterality: N/A;    Current Medications: Current Meds  Medication Sig  . acetaminophen (TYLENOL) 500 MG tablet Take 500-1,000 mg by mouth every 6 (six) hours as needed (for pain.).  Marland Kitchen albuterol (PROVENTIL) (2.5 MG/3ML)  0.083% nebulizer solution Take 2.5 mg by nebulization every 6 (six) hours as needed.  Marland Kitchen albuterol (VENTOLIN HFA) 108 (90 Base) MCG/ACT inhaler Inhale into the lungs every 6 (six) hours as needed for wheezing or shortness of breath.  Marland Kitchen apixaban (ELIQUIS) 5 MG TABS tablet Take 1 tablet (5 mg total) by mouth 2 (two) times daily.  Marland Kitchen aspirin EC 81 MG tablet Take 1 tablet (81 mg total) by mouth daily. Swallow whole.  Marland Kitchen Dextran 70-Hypromellose, PF, (ARTIFICIAL TEARS PF) 0.1-0.3 % SOLN Place 1 drop into both eyes 3 (three) times daily as needed (dry/irritated eyes.).  Marland Kitchen levothyroxine (SYNTHROID) 88 MCG tablet Take 88 mcg by mouth daily.  . metFORMIN (GLUCOPHAGE) 1000 MG tablet Take 1,000 mg by mouth 2 (two) times daily with a meal.  . Omega-3 Fatty Acids (FISH OIL ULTRA) 1400 MG CAPS Take 1,400 mg by mouth in the morning and at bedtime.  . pantoprazole (PROTONIX) 40 MG tablet Take 40 mg by mouth daily.  Marland Kitchen PARoxetine (PAXIL) 40 MG tablet Take 40 mg by mouth daily.  Marland Kitchen pyridostigmine (MESTINON) 60  MG tablet Take 60 mg by mouth 4 (four) times daily.  . Semaglutide,0.25 or 0.5MG /DOS, (OZEMPIC, 0.25 OR 0.5 MG/DOSE,) 2 MG/1.5ML SOPN Inject 0.5 mg into the skin every Wednesday.   . vitamin B-12 (CYANOCOBALAMIN) 1000 MCG tablet Take 1,000 mcg by mouth daily.     Allergies:   Codeine, Hydrocodone-acetaminophen, Lipitor [atorvastatin], and Latex   Social History   Socioeconomic History  . Marital status: Married    Spouse name: Selena Batten  . Number of children: 3  . Years of education: Not on file  . Highest education level: Not on file  Occupational History  . Occupation: retired  Tobacco Use  . Smoking status: Former Smoker    Types: Cigars, Pipe    Quit date: 11/30/1999    Years since quitting: 21.4  . Smokeless tobacco: Current User    Types: Snuff  . Tobacco comment: stopped smoking pipe tobacco 30 years ago  Vaping Use  . Vaping Use: Never used  Substance and Sexual Activity  . Alcohol use: Yes     Comment: Occasional Beer   . Drug use: Never  . Sexual activity: Not on file  Other Topics Concern  . Not on file  Social History Narrative  . Not on file   Social Determinants of Health   Financial Resource Strain: Not on file  Food Insecurity: Not on file  Transportation Needs: Not on file  Physical Activity: Not on file  Stress: Not on file  Social Connections: Not on file     Family History: The patient's family history includes Blindness in his father; CAD in his father; Cancer in his father; Heart Problems in his mother; Hypertension in his father; Rheum arthritis in his mother. There is no history of Colon cancer, Esophageal cancer, Rectal cancer, or Stomach cancer. ROS:   Please see the history of present illness.    All other systems reviewed and are negative.  EKGs/Labs/Other Studies Reviewed:    The following studies were reviewed today:   Recent Labs: 10/24/2020: ALT 21 11/11/2020: NT-Pro BNP 247; TSH 29.200 12/19/2020: Magnesium 1.8 01/02/2021: BUN 20; Creatinine, Ser 1.17; Hemoglobin 13.6; Platelets 221; Potassium 4.2; Sodium 139  Recent Lipid Panel No results found for: CHOL, TRIG, HDL, CHOLHDL, VLDL, LDLCALC, LDLDIRECT  Physical Exam:    VS:  BP 120/60   Pulse 62   Ht  (1.676 m)   Wt 194 lb 6.4 oz (88.2 kg)   SpO2 98%   BMI 31.38 kg/m     Wt Readings from Last 3 Encounters:  05/06/21 194 lb 6.4 oz (88.2 kg)  01/30/21 196 lb (88.9 kg)  01/29/21 196 lb 2 oz (89 kg)     GEN:  Well nourished, well developed in no acute distress HEENT: Normal NECK: No JVD; No carotid bruits LYMPHATICS: No lymphadenopathy CARDIAC: RRR, no murmurs, rubs, gallops RESPIRATORY:  Clear to auscultation without rales, wheezing or rhonchi  ABDOMEN: Soft, non-tender, non-distended MUSCULOSKELETAL:  No edema; No deformity  SKIN: Warm and dry NEUROLOGIC:  Alert and oriented x 3 PSYCHIATRIC:  Normal affect    Signed, Norman Herrlich, MD  05/06/2021 1:15 PM    [facility]  1. Systemic anticoagulation is recommended.  2. There seems to be an old formed thrombus attached to the pectinate  muscles in the left atrial appendage.  3. Left ventricular ejection fraction, by estimation, is 55 to 60%. The  left ventricle has normal function. The left ventricle has no regional  wall  motion abnormalities. There is mild concentric left ventricular  hypertrophy. Left ventricular diastolic  function could not be evaluated.  4. Right ventricular systolic function is normal. The right ventricular  size is normal.  5. Left atrial size was mildly dilated. A left atrial/left atrial  appendage thrombus was detected. The LAA emptying velocity was 80 cm/s.  6. The mitral valve is normal in structure. Mild mitral valve  regurgitation. No evidence of mitral stenosis.  7. The aortic valve is normal in structure. Aortic valve regurgitation is  not visualized. No aortic stenosis is present.  8. There is mild (Grade II) plaque.  9. The inferior vena cava is normal in size with greater than 50%  respiratory variability, suggesting right atrial pressure of 3 mmHg Past Medical History:  Diagnosis Date  . Abnormal EKG 03/09/2018  . Acquired hypothyroidism 03/07/2018  . Allergy   . Aortic atherosclerosis (HCC) 07/17/2020   Formatting of this note might be different from the original. Seen on CT [facility] 06/2019  . Arthritis   . Asthma   . Cataract   . Chronic gout without tophus 03/07/2018  . Coronary artery calcification seen on CAT scan 07/17/2020   Formatting of this note might be different from the original. 06/2019. [facility]  . Cough syncope 1999  . Dental caries   . Depression 03/07/2018  . Diplopia 10/11/2018  . DISH (diffuse idiopathic skeletal hyperostosis) 07/17/2020   Formatting of this note might be different from the original. Seen on CT [facility] 06/2019  . Dizzy 05/09/2018  . Gastroesophageal reflux disease without esophagitis 03/07/2018   Formatting of  this note might be different from the original. Long term  . GERD (gastroesophageal reflux disease) 03/07/2018  . Gout 03/07/2018  . Hay fever   . Heel spur, left 02/07/2020  . Hyperlipidemia 03/07/2018  . Hypothyroidism (acquired) 03/07/2018   Formatting of this note might be different from the original. 1990s.  . Mixed hyperlipidemia 03/07/2018  . Obesity   . Ocular myasthenia gravis (HCC) 07/17/2020   Formatting of this note might be different from the original. Ocular.  Follows at [facility].  Sees neurology and eye doctor.  . Palpitation 03/08/2018  . Plantar fasciitis 02/07/2020  . Pneumonia   . Primary osteoarthritis involving multiple joints 07/17/2020  . Recurrent sinusitis   . Smokeless tobacco use 07/17/2020   Formatting of this note might be different from the original. Discussed chantix.  He wants to use. Did well in past. Off label, he understands.  . Tightness of heel cord, left 02/07/2020  . Type 2 diabetes mellitus (HCC) 03/07/2018  . Type 2 diabetes mellitus without complication, without long-term current use of insulin (HCC) 03/07/2018   Formatting of this note might be different from the original. 2014.    Past Surgical History:  Procedure Laterality Date  . CATARACT EXTRACTION W/ INTRAOCULAR LENS  IMPLANT, BILATERAL    . COLONOSCOPY W/ POLYPECTOMY  11/27/2012   Colonic polyp, status post polypectomy. Mild sigmoid diverticulosis.  . CORONARY ARTERY BYPASS GRAFT N/A 10/27/2020   Procedure: CORONARY ARTERY BYPASS GRAFTING (CABG), ON PUMP, TIMES FOUR, USING LEFT INTERNAL MAMMARY ARTERY AND ENDOSCOPICALLY HARVESTED RIGHT GREATER SAPHENOUS VEIN;  Surgeon: [name], MD;  Location: [facility] OR;  Service: Open Heart Surgery;  Laterality: N/A;  . detatched retina    . ESOPHAGOGASTRODUODENOSCOPY  04/17/2007   Small hiatal hernia. Mild gastritis. Otherwise normal EGD  . kidney stone extraction     cystoscopy x 2  . KNEE SURGERY Left  bone spurs  . LEFT HEART CATH AND CORONARY ANGIOGRAPHY N/A  10/08/2020   Procedure: LEFT HEART CATH AND CORONARY ANGIOGRAPHY;  Surgeon: Marykay Lex, MD;  Location: [facility] INVASIVE CV LAB;  Service: Cardiovascular;  Laterality: N/A;  . NOSE SURGERY    . TEE WITHOUT CARDIOVERSION N/A 10/27/2020   Procedure: TRANSESOPHAGEAL ECHOCARDIOGRAM (TEE);  Surgeon: [name], MD;  Location: [facility] OR;  Service: Open Heart Surgery;  Laterality: N/A;  . TEE WITHOUT CARDIOVERSION N/A 01/08/2021   Procedure: TRANSESOPHAGEAL ECHOCARDIOGRAM (TEE);  Surgeon: Lars Masson, MD;  Location: [facility] ENDOSCOPY;  Service: Cardiovascular;  Laterality: N/A;    Current Medications: Current Meds  Medication Sig  . acetaminophen (TYLENOL) 500 MG tablet Take 500-1,000 mg by mouth every 6 (six) hours as needed (for pain.).  Marland Kitchen albuterol (PROVENTIL) (2.5 MG/3ML) 0.083% nebulizer solution Take 2.5 mg by nebulization every 6 (six) hours as needed.  Marland Kitchen albuterol (VENTOLIN HFA) 108 (90 Base) MCG/ACT inhaler Inhale into the lungs every 6 (six) hours as needed for wheezing or shortness of breath.  Marland Kitchen apixaban (ELIQUIS) 5 MG TABS tablet Take 1 tablet (5 mg total) by mouth 2 (two) times daily.  Marland Kitchen aspirin EC 81 MG tablet Take 1 tablet (81 mg total) by mouth daily. Swallow whole.  Marland Kitchen Dextran 70-Hypromellose, PF, (ARTIFICIAL TEARS PF) 0.1-0.3 % SOLN Place 1 drop into both eyes 3 (three) times daily as needed (dry/irritated eyes.).  Marland Kitchen levothyroxine (SYNTHROID) 88 MCG tablet Take 88 mcg by mouth daily.  . metFORMIN (GLUCOPHAGE) 1000 MG tablet Take 1,000 mg by mouth 2 (two) times daily with a meal.  . Omega-3 Fatty Acids (FISH OIL ULTRA) 1400 MG CAPS Take 1,400 mg by mouth in the morning and at bedtime.  . pantoprazole (PROTONIX) 40 MG tablet Take 40 mg by mouth daily.  Marland Kitchen PARoxetine (PAXIL) 40 MG tablet Take 40 mg by mouth daily.  Marland Kitchen pyridostigmine (MESTINON) 60 MG tablet Take 60 mg by mouth 4 (four) times daily.  . Semaglutide,0.25 or 0.5MG /DOS, (OZEMPIC, 0.25 OR 0.5 MG/DOSE,) 2 MG/1.5ML SOPN Inject  0.5 mg into the skin every Wednesday.   . vitamin B-12 (CYANOCOBALAMIN) 1000 MCG tablet Take 1,000 mcg by mouth daily.     Allergies:   Codeine, Hydrocodone-acetaminophen, Lipitor [atorvastatin], and Latex   Social History   Socioeconomic History  . Marital status: Married    Spouse name: Selena Batten  . Number of children: 3  . Years of education: Not on file  . Highest education level: Not on file  Occupational History  . Occupation: retired  Tobacco Use  . Smoking status: Former Smoker    Types: Cigars, Pipe    Quit date: 11/30/1999    Years since quitting: 21.4  . Smokeless tobacco: Current User    Types: Snuff  . Tobacco comment: stopped smoking pipe tobacco 30 years ago  Vaping Use  . Vaping Use: Never used  Substance and Sexual Activity  . Alcohol use: Yes    Comment: Occasional Beer   . Drug use: Never  . Sexual activity: Not on file  Other Topics Concern  . Not on file  Social History Narrative  . Not on file   Social Determinants of Health   Financial Resource Strain: Not on file  Food Insecurity: Not on file  Transportation Needs: Not on file  Physical Activity: Not on file  Stress: Not on file  Social Connections: Not on file     Family History: The patient's family history includes Blindness  in his father; CAD in his father; Cancer in his father; Heart Problems in his mother; Hypertension in his father; Rheum arthritis in his mother. There is no history of Colon cancer, Esophageal cancer, Rectal cancer, or Stomach cancer. ROS:   Please see the history of present illness.    All other systems reviewed and are negative.  EKGs/Labs/Other Studies Reviewed:    The following studies were reviewed today:   Recent Labs: 10/24/2020: ALT 21 11/11/2020: NT-Pro BNP 247; TSH 29.200 12/19/2020: Magnesium 1.8 01/02/2021: BUN 20; Creatinine, Ser 1.17; Hemoglobin 13.6; Platelets 221; Potassium 4.2; Sodium 139  Recent Lipid Panel No results found for: CHOL, TRIG, HDL,  CHOLHDL, VLDL, LDLCALC, LDLDIRECT  Physical Exam:    VS:  BP 120/60   Pulse 62   Ht  (1.676 m)   Wt 194 lb 6.4 oz (88.2 kg)   SpO2 98%   BMI 31.38 kg/m     Wt Readings from Last 3 Encounters:  05/06/21 194 lb 6.4 oz (88.2 kg)  01/30/21 196 lb (88.9 kg)  01/29/21 196 lb 2 oz (89 kg)     GEN:  Well nourished, well developed in no acute distress HEENT: Normal NECK: No JVD; No carotid bruits LYMPHATICS: No lymphadenopathy CARDIAC: RRR, no murmurs, rubs, gallops RESPIRATORY:  Clear to auscultation without rales, wheezing or rhonchi  ABDOMEN: Soft, non-tender, non-distended MUSCULOSKELETAL:  No edema; No deformity  SKIN: Warm and dry NEUROLOGIC:  Alert and oriented x 3 PSYCHIATRIC:  Normal affect    Signed, Norman Herrlich, MD  05/06/2021 1:15 PM    [facility]

## 2021-05-06 ENCOUNTER — Ambulatory Visit (INDEPENDENT_AMBULATORY_CARE_PROVIDER_SITE_OTHER): Payer: Commercial Managed Care - PPO | Admitting: Cardiology

## 2021-05-06 ENCOUNTER — Other Ambulatory Visit: Payer: Self-pay

## 2021-05-06 ENCOUNTER — Encounter: Payer: Self-pay | Admitting: Cardiology

## 2021-05-06 VITALS — BP 120/60 | HR 62 | Ht 66.0 in | Wt 194.4 lb

## 2021-05-06 DIAGNOSIS — I480 Paroxysmal atrial fibrillation: Secondary | ICD-10-CM | POA: Diagnosis not present

## 2021-05-06 DIAGNOSIS — Z951 Presence of aortocoronary bypass graft: Secondary | ICD-10-CM | POA: Diagnosis not present

## 2021-05-06 DIAGNOSIS — Z7901 Long term (current) use of anticoagulants: Secondary | ICD-10-CM

## 2021-05-06 DIAGNOSIS — I2510 Atherosclerotic heart disease of native coronary artery without angina pectoris: Secondary | ICD-10-CM

## 2021-05-06 DIAGNOSIS — E119 Type 2 diabetes mellitus without complications: Secondary | ICD-10-CM

## 2021-05-06 DIAGNOSIS — I513 Intracardiac thrombosis, not elsewhere classified: Secondary | ICD-10-CM | POA: Diagnosis not present

## 2021-05-06 DIAGNOSIS — E782 Mixed hyperlipidemia: Secondary | ICD-10-CM

## 2021-05-06 MED ORDER — ROSUVASTATIN CALCIUM 10 MG PO TABS: 10.0000 mg | ORAL_TABLET | Freq: Every day | ORAL | 3 refills | Status: DC

## 2021-05-06 NOTE — Patient Instructions (Signed)
Medication Instructions:  Your physician has recommended you make the following change in your medication:  START: Rosuvastatin 10 mg take one tablet by mouth daily.   *If you need a refill on your cardiac medications before your next appointment, please call your pharmacy*   Lab Work: Your physician recommends that you return for lab work in: 6 weeks CMP, Lipids If you have labs (blood work) drawn today and your tests are completely normal, you will receive your results only by: Marland Kitchen MyChart Message (if you have MyChart) OR . A paper copy in the mail If you have any lab test that is abnormal or we need to change your treatment, we will call you to review the results.   Testing/Procedures: None   Follow-Up: At [facility], you and your health needs are our priority.  As part of our continuing mission to provide you with exceptional heart care, we have created designated Provider Care Teams.  These Care Teams include your primary Cardiologist (physician) and Advanced Practice Providers (APPs -  Physician Assistants and Nurse Practitioners) who all work together to provide you with the care you need, when you need it.  We recommend signing up for the patient portal called "MyChart".  Sign up information is provided on this After Visit Summary.  MyChart is used to connect with patients for Virtual Visits (Telemedicine).  Patients are able to view lab/test results, encounter notes, upcoming appointments, etc.  Non-urgent messages can be sent to your provider as well.   To learn more about what you can do with MyChart, go to [email or web address].    Your next appointment:   6 month(s)  The format for your next appointment:   In Person  Provider:   Norman Herrlich, MD   Other Instructions

## 2021-05-06 NOTE — Addendum Note (Signed)
Addended by: Delorse Limber I on: 05/06/2021 01:24 PM   Modules accepted: Orders

## 2021-06-17 ENCOUNTER — Other Ambulatory Visit: Payer: Self-pay

## 2021-06-17 ENCOUNTER — Other Ambulatory Visit: Payer: Self-pay | Admitting: Cardiology

## 2021-06-17 DIAGNOSIS — Z7901 Long term (current) use of anticoagulants: Secondary | ICD-10-CM

## 2021-06-17 DIAGNOSIS — I513 Intracardiac thrombosis, not elsewhere classified: Secondary | ICD-10-CM

## 2021-06-17 DIAGNOSIS — Z79899 Other long term (current) drug therapy: Secondary | ICD-10-CM

## 2021-06-17 DIAGNOSIS — E782 Mixed hyperlipidemia: Secondary | ICD-10-CM

## 2021-06-17 DIAGNOSIS — I2510 Atherosclerotic heart disease of native coronary artery without angina pectoris: Secondary | ICD-10-CM

## 2021-06-17 DIAGNOSIS — R002 Palpitations: Secondary | ICD-10-CM

## 2021-06-17 DIAGNOSIS — I480 Paroxysmal atrial fibrillation: Secondary | ICD-10-CM

## 2021-06-17 DIAGNOSIS — E119 Type 2 diabetes mellitus without complications: Secondary | ICD-10-CM

## 2021-06-17 DIAGNOSIS — Z951 Presence of aortocoronary bypass graft: Secondary | ICD-10-CM

## 2021-06-18 LAB — LIPID PANEL
Chol/HDL Ratio: 2.7 ratio (ref 0.0–5.0)
Cholesterol, Total: 136 mg/dL (ref 100–199)
HDL: 50 mg/dL (ref >39)
LDL Chol Calc (NIH): 67 mg/dL (ref 0–99)
Triglycerides: 106 mg/dL (ref 0–149)
VLDL Cholesterol Cal: 19 mg/dL (ref 5–40)

## 2021-06-18 LAB — COMPREHENSIVE METABOLIC PANEL
ALT: 16 IU/L (ref 0–44)
AST: 19 IU/L (ref 0–40)
Albumin/Globulin Ratio: 2.2 (ref 1.2–2.2)
Albumin: 4.6 g/dL (ref 3.8–4.8)
Alkaline Phosphatase: 63 IU/L (ref 44–121)
BUN/Creatinine Ratio: 15 (ref 10–24)
BUN: 17 mg/dL (ref 8–27)
Bilirubin Total: 0.6 mg/dL (ref 0.0–1.2)
CO2: 22 mmol/L (ref 20–29)
Calcium: 9.3 mg/dL (ref 8.6–10.2)
Chloride: 100 mmol/L (ref 96–106)
Creatinine, Ser: 1.13 mg/dL (ref 0.76–1.27)
Globulin, Total: 2.1 g/dL (ref 1.5–4.5)
Glucose: 255 mg/dL — ABNORMAL HIGH (ref 65–99)
Potassium: 4.6 mmol/L (ref 3.5–5.2)
Sodium: 137 mmol/L (ref 134–144)
Total Protein: 6.7 g/dL (ref 6.0–8.5)
eGFR: 72 mL/min/{1.73_m2} (ref >59)

## 2021-07-10 ENCOUNTER — Other Ambulatory Visit: Payer: Self-pay

## 2021-07-10 MED ORDER — APIXABAN 5 MG PO TABS: 5.0000 mg | ORAL_TABLET | Freq: Two times a day (BID) | ORAL | 1 refills | Status: DC

## 2021-07-10 NOTE — Telephone Encounter (Signed)
Prescription refill request for Eliquis received. Last office visit:[place] 05/06/21 Scr: 1.13 06/17/21 Age: 48m [id number]

## 2021-08-05 DIAGNOSIS — M791 Myalgia, unspecified site: Secondary | ICD-10-CM

## 2021-08-05 DIAGNOSIS — T466X5A Adverse effect of antihyperlipidemic and antiarteriosclerotic drugs, initial encounter: Secondary | ICD-10-CM | POA: Insufficient documentation

## 2021-08-05 HISTORY — DX: Adverse effect of antihyperlipidemic and antiarteriosclerotic drugs, initial encounter: T46.6X5A

## 2021-08-05 HISTORY — DX: Myalgia, unspecified site: M79.10

## 2021-10-18 NOTE — Progress Notes (Addendum)
Cardiology Office Note:    Date:  10/19/2021   ID:  Derek Moore, Derek Moore 03/09/1956, MRN [id number]  PCP:  Derek Mina., MD  Cardiologist:  Derek More, MD    Referring MD: Derek Mina., MD    ASSESSMENT:    1. Coronary artery disease involving native coronary artery of native heart without angina pectoris   2. S/P CABG x 4   3. PAF (paroxysmal atrial fibrillation) (Derek Moore)   4. Thrombus of left atrial appendage   5. Chronic anticoagulation   6. Mixed hyperlipidemia   7. Iron deficiency    PLAN:    In order of problems listed above:  Derek Moore is done well since bypass surgery New York Heart Association class I he will continue Eliquis stop aspirin as well as his lipid-lowering. No recurrent clinical atrial fibrillation however he had thrombus in the left atrial appendage he has been anticoagulated he has iron deficiency anemia and is concerned about his risk for GI bleeding normal term consequence of anticoagulant and will seek opinion about advisability of watchman device as an alternative Continue long-term anticoagulation Continue his lipid-lowering rosuvastatin Recent literature suggests that every other day 325 mg of iron is best absorbed and avoids iron absorption blockade Administered daily.  Derek Moore I have seen Derek Moore in the office today.  He was referred initially by Derek Mina., MD and at the time of this visit for consideration for Left Atrial Appendage Closure with the Watchman Device for management of stroke risk. Based upon past history, it has been determined that he is a poor candidate for long-term oral anticoagulation.  However, he may be tolerant of short-term treatment with an anticoagulant as necessary.  A shared decision has been made utilizing the [employer] of Cardiology shared decision tool to undergo Left Atrial Appendage Closure with Watchman device at this time.      Medication Adjustments/Labs and  Tests Ordered: Current medicines are reviewed at length with the patient today.  Concerns regarding medicines are outlined above.  Orders Placed This Encounter  Procedures   EKG 12-Lead   No orders of the defined types were placed in this encounter.   Chief Complaint  Patient presents with   Follow-up   Coronary Artery Disease   Atrial Fibrillation   Anticoagulation   History of Present Illness:    Derek Moore is a 65 y.o. male with a hx of coronary artery disease with CABG November [id number] complicated by postoperative atrial fibrillation and left atrial appendage thrombus with chronic anticoagulation, hyperlipidemia reports statin intolerance hypertension and type 2 diabetes last seen by me 05/06/2021.  Other problems include hypothyroidism and anemia with recent stool Hemoccult showing no finding of GI bleeding he is iron deficient.  Compliance with diet, lifestyle and medications: Yes  Overall is done well with return to full activities and is not having shortness of breath chest pain palpitation or syncope. He tolerates lipid-lowering therapy with high intensity statin statin without muscle pain He is concerned about the extent of bruising he has with Eliquis and aspirin the year remote from bypass and think we can safely stop antiplatelet therapy. He is iron deficient and inquires whether he needs to remain on Eliquis indefinitely and I will asked Derek Moore to give me an opinion about the option of watchman device in this situation.  Recent labs 09/14/2021 [facility] TSH 44 ferritin 12 percentage iron saturation 12% hemoglobin mildly diminished 13.8 MCV is  normal 90 08/05/2021 total cholesterol 144 LDL 59 HDL 73 non-HDL cholesterol 71 Past Medical History:  Diagnosis Date   Abnormal EKG 03/09/2018   Acquired hypothyroidism 03/07/2018   Allergy    Aortic atherosclerosis (HCC) 07/17/2020   Formatting of this note might be different from the original.  Seen on [facility] 06/2019   Arthritis    Asthma    Cataract    Chronic gout without tophus 03/07/2018   Coronary artery calcification seen on CAT scan 07/17/2020   Formatting of this note might be different from the original. 06/2019. [facility]   Cough syncope 1999   Dental caries    Depression 03/07/2018   Diplopia 10/11/2018   DISH (diffuse idiopathic skeletal hyperostosis) 07/17/2020   Formatting of this note might be different from the original. Seen on [facility] 06/2019   Dizzy 05/09/2018   Gastroesophageal reflux disease without esophagitis 03/07/2018   Formatting of this note might be different from the original. Long term   GERD (gastroesophageal reflux disease) 03/07/2018   Gout 03/07/2018   Hay fever    Heel spur, left 02/07/2020   Hyperlipidemia 03/07/2018   Hypothyroidism (acquired) 03/07/2018   Formatting of this note might be different from the original. 1990s.   Mixed hyperlipidemia 03/07/2018   Obesity    Ocular myasthenia gravis (HCC) 07/17/2020   Formatting of this note might be different from the original. Ocular.  Follows at [facility].  Sees neurology and eye doctor.   Palpitation 03/08/2018   Plantar fasciitis 02/07/2020   Pneumonia    Primary osteoarthritis involving multiple joints 07/17/2020   Recurrent sinusitis    Smokeless tobacco use 07/17/2020   Formatting of this note might be different from the original. Discussed chantix.  He wants to use. Did well in past. Off label, he understands.   Tightness of heel cord, left 02/07/2020   Type 2 diabetes mellitus (HCC) 03/07/2018   Type 2 diabetes mellitus without complication, without long-term current use of insulin (HCC) 03/07/2018   Formatting of this note might be different from the original. 2014.    Past Surgical History:  Procedure Laterality Date   CATARACT EXTRACTION W/ INTRAOCULAR LENS  IMPLANT, BILATERAL     COLONOSCOPY W/ POLYPECTOMY  11/27/2012   Colonic polyp, status post polypectomy. Mild sigmoid diverticulosis.   CORONARY ARTERY  BYPASS GRAFT N/A 10/27/2020   Procedure: CORONARY ARTERY BYPASS GRAFTING (CABG), ON PUMP, TIMES FOUR, USING LEFT INTERNAL MAMMARY ARTERY AND ENDOSCOPICALLY HARVESTED RIGHT GREATER SAPHENOUS VEIN;  Surgeon: [name], MD;  Location: [facility] OR;  Service: Open Heart Surgery;  Laterality: N/A;   detatched retina     ESOPHAGOGASTRODUODENOSCOPY  04/17/2007   Small hiatal hernia. Mild gastritis. Otherwise normal EGD   kidney stone extraction     cystoscopy x 2   KNEE SURGERY Left    bone spurs   LEFT HEART CATH AND CORONARY ANGIOGRAPHY N/A 10/08/2020   Procedure: LEFT HEART CATH AND CORONARY ANGIOGRAPHY;  Surgeon: Marykay Lex, MD;  Location: [facility] INVASIVE CV LAB;  Service: Cardiovascular;  Laterality: N/A;   NOSE SURGERY     TEE WITHOUT CARDIOVERSION N/A 10/27/2020   Procedure: TRANSESOPHAGEAL ECHOCARDIOGRAM (TEE);  Surgeon: [name], MD;  Location: [facility] OR;  Service: Open Heart Surgery;  Laterality: N/A;   TEE WITHOUT CARDIOVERSION N/A 01/08/2021   Procedure: TRANSESOPHAGEAL ECHOCARDIOGRAM (TEE);  Surgeon: Lars Masson, MD;  Location: [facility] ENDOSCOPY;  Service: Cardiovascular;  Laterality: N/A;    Current Medications: Current Meds  Medication Sig   acetaminophen (TYLENOL) 500 MG tablet Take 500-1,000 mg by mouth every 6 (six) hours as needed (for pain.).   albuterol (PROVENTIL) (2.5 MG/3ML) 0.083% nebulizer solution Take 2.5 mg by nebulization every 6 (six) hours as needed for wheezing or shortness of breath.   albuterol (VENTOLIN HFA) 108 (90 Base) MCG/ACT inhaler Inhale into the lungs every 6 (six) hours as needed for wheezing or shortness of breath.   apixaban (ELIQUIS) 5 MG TABS tablet Take 1 tablet (5 mg total) by mouth 2 (two) times daily.   aspirin EC 81 MG tablet Take 1 tablet (81 mg total) by mouth daily. Swallow whole.   Dextran 70-Hypromellose, PF, (ARTIFICIAL TEARS PF) 0.1-0.3 % SOLN Place 1 drop into both eyes 3 (three) times daily as needed (dry/irritated eyes.).    JARDIANCE 10 MG TABS tablet Take 10 mg by mouth daily.   levothyroxine (SYNTHROID) 137 MCG tablet Take 137 mcg by mouth daily.   metFORMIN (GLUCOPHAGE) 1000 MG tablet Take 1,000 mg by mouth 2 (two) times daily with a meal.   Omega-3 Fatty Acids (FISH OIL ULTRA) 1400 MG CAPS Take 1,400 mg by mouth in the morning and at bedtime.   pantoprazole (PROTONIX) 40 MG tablet Take 40 mg by mouth daily.   PARoxetine (PAXIL) 40 MG tablet Take 40 mg by mouth daily.   predniSONE (DELTASONE) 10 MG tablet Take 10 mg by mouth daily.   pyridostigmine (MESTINON) 60 MG tablet Take 60 mg by mouth 4 (four) times daily.   rosuvastatin (CRESTOR) 10 MG tablet TAKE 1 TABLET BY MOUTH  DAILY   Semaglutide,0.25 or 0.5MG /DOS, (OZEMPIC, 0.25 OR 0.5 MG/DOSE,) 2 MG/1.5ML SOPN Inject 0.5 mg into the skin every Wednesday.    vitamin B-12 (CYANOCOBALAMIN) 1000 MCG tablet Take 1,000 mcg by mouth daily.     Allergies:   Codeine, Hydrocodone-acetaminophen, Lipitor [atorvastatin], and Latex   Social History   Socioeconomic History   Marital status: Married    Spouse name: Kim   Number of children: 3   Years of education: Not on file   Highest education level: Not on file  Occupational History   Occupation: retired  Tobacco Use   Smoking status: Former    Types: Landscape architect, Pipe    Quit date: 11/30/1999    Years since quitting: 21.9   Smokeless tobacco: Current    Types: Snuff   Tobacco comments:    stopped smoking pipe tobacco 30 years ago  Vaping Use   Vaping Use: Never used  Substance and Sexual Activity   Alcohol use: Yes    Comment: Occasional Beer    Drug use: Never   Sexual activity: Not on file  Other Topics Concern   Not on file  Social History Narrative   Not on file   Social Determinants of Health   Financial Resource Strain: Not on file  Food Insecurity: Not on file  Transportation Needs: Not on file  Physical Activity: Not on file  Stress: Not on file  Social Connections: Not on file     Family  History: The patient's family history includes Blindness in his father; CAD in his father; Cancer in his father; Heart Problems in his mother; Hypertension in his father; Rheum arthritis in his mother. There is no history of Colon cancer, Esophageal cancer, Rectal cancer, or Stomach cancer. ROS:   Please see the history of present illness.    All other systems reviewed and are negative.  EKGs/Labs/Other Studies Reviewed:  The following studies were reviewed today:  EKG:  EKG ordered today and personally reviewed.  The ekg ordered today demonstrates sinus rhythm incomplete left bundle branch block  Recent Labs: 11/11/2020: NT-Pro BNP 247; TSH 29.200 12/19/2020: Magnesium 1.8 01/02/2021: Hemoglobin 13.6; Platelets 221 06/17/2021: ALT 16; BUN 17; Creatinine, Ser 1.13; Potassium 4.6; Sodium 137  Recent Lipid Panel    Component Value Date/Time   CHOL 136 06/17/2021 0850   TRIG 106 06/17/2021 0850   HDL 50 06/17/2021 0850   CHOLHDL 2.7 06/17/2021 0850   LDLCALC 67 06/17/2021 0850    Physical Exam:    VS:  BP 100/68   Pulse 64   Ht 5\' 6"  (1.676 m)   Wt 198 lb 12.8 oz (90.2 kg)   SpO2 97%   BMI 32.09 kg/m     Wt Readings from Last 3 Encounters:  10/19/21 198 lb 12.8 oz (90.2 kg)  05/06/21 194 lb 6.4 oz (88.2 kg)  01/30/21 196 lb (88.9 kg)     GEN:  Well nourished, well developed in no acute distress HEENT: Normal NECK: No JVD; No carotid bruits LYMPHATICS: No lymphadenopathy CARDIAC: RRR, no murmurs, rubs, gallops RESPIRATORY:  Clear to auscultation without rales, wheezing or rhonchi  ABDOMEN: Soft, non-tender, non-distended MUSCULOSKELETAL:  No edema; No deformity  SKIN: Warm and dry NEUROLOGIC:  Alert and oriented x 3 PSYCHIATRIC:  Normal affect    Signed, 04/01/21, MD  10/19/2021 11:36 AM    [facility]

## 2021-10-19 ENCOUNTER — Ambulatory Visit (INDEPENDENT_AMBULATORY_CARE_PROVIDER_SITE_OTHER): Payer: Commercial Managed Care - PPO | Admitting: Cardiology

## 2021-10-19 ENCOUNTER — Telehealth: Payer: Self-pay

## 2021-10-19 ENCOUNTER — Encounter: Payer: Self-pay | Admitting: Cardiology

## 2021-10-19 ENCOUNTER — Other Ambulatory Visit: Payer: Self-pay

## 2021-10-19 ENCOUNTER — Other Ambulatory Visit (HOSPITAL_COMMUNITY): Payer: Self-pay | Admitting: Emergency Medicine

## 2021-10-19 VITALS — BP 100/68 | HR 64 | Ht 66.0 in | Wt 198.8 lb

## 2021-10-19 DIAGNOSIS — E611 Iron deficiency: Secondary | ICD-10-CM

## 2021-10-19 DIAGNOSIS — I2510 Atherosclerotic heart disease of native coronary artery without angina pectoris: Secondary | ICD-10-CM

## 2021-10-19 DIAGNOSIS — Z951 Presence of aortocoronary bypass graft: Secondary | ICD-10-CM

## 2021-10-19 DIAGNOSIS — I480 Paroxysmal atrial fibrillation: Secondary | ICD-10-CM

## 2021-10-19 DIAGNOSIS — E782 Mixed hyperlipidemia: Secondary | ICD-10-CM

## 2021-10-19 DIAGNOSIS — I513 Intracardiac thrombosis, not elsewhere classified: Secondary | ICD-10-CM

## 2021-10-19 DIAGNOSIS — Z7901 Long term (current) use of anticoagulants: Secondary | ICD-10-CM

## 2021-10-19 NOTE — Patient Instructions (Addendum)
Medication Instructions:  Your physician has recommended you make the following change in your medication:  STOP: Aspirin DECREASE: Iron to every other day.  *If you need a refill on your cardiac medications before your next appointment, please call your pharmacy*   Lab Work: None If you have labs (blood work) drawn today and your tests are completely normal, you will receive your results only by: MyChart Message (if you have MyChart) OR A paper copy in the mail If you have any lab test that is abnormal or we need to change your treatment, we will call you to review the results.   Testing/Procedures: None   Follow-Up: At [facility], you and your health needs are our priority.  As part of our continuing mission to provide you with exceptional heart care, we have created designated Provider Care Teams.  These Care Teams include your primary Cardiologist (physician) and Advanced Practice Providers (APPs -  Physician Assistants and Nurse Practitioners) who all work together to provide you with the care you need, when you need it.  We recommend signing up for the patient portal called "MyChart".  Sign up information is provided on this After Visit Summary.  MyChart is used to connect with patients for Virtual Visits (Telemedicine).  Patients are able to view lab/test results, encounter notes, upcoming appointments, etc.  Non-urgent messages can be sent to your provider as well.   To learn more about what you can do with MyChart, go to [email or web address].    Your next appointment:   1 year(s)  The format for your next appointment:   In Person  Provider:   Norman Herrlich, MD    Other Instructions

## 2021-10-28 ENCOUNTER — Other Ambulatory Visit: Payer: Self-pay

## 2021-10-28 ENCOUNTER — Other Ambulatory Visit: Payer: Commercial Managed Care - PPO | Admitting: *Deleted

## 2021-10-28 DIAGNOSIS — I480 Paroxysmal atrial fibrillation: Secondary | ICD-10-CM

## 2021-10-28 LAB — BASIC METABOLIC PANEL
BUN/Creatinine Ratio: 18 (ref 10–24)
BUN: 19 mg/dL (ref 8–27)
CO2: 23 mmol/L (ref 20–29)
Calcium: 9 mg/dL (ref 8.6–10.2)
Chloride: 100 mmol/L (ref 96–106)
Creatinine, Ser: 1.08 mg/dL (ref 0.76–1.27)
Glucose: 406 mg/dL — ABNORMAL HIGH (ref 70–99)
Potassium: 4.3 mmol/L (ref 3.5–5.2)
Sodium: 139 mmol/L (ref 134–144)
eGFR: 76 mL/min/{1.73_m2} (ref >59)

## 2021-10-28 NOTE — Telephone Encounter (Signed)
Scheduled for lab work today at [facility].  Cardiac CT instructions created and sent via mychart.

## 2021-10-30 ENCOUNTER — Telehealth: Payer: Self-pay

## 2021-10-30 ENCOUNTER — Telehealth (HOSPITAL_COMMUNITY): Payer: Self-pay | Admitting: *Deleted

## 2021-10-30 NOTE — Telephone Encounter (Signed)
Reaching out to patient to offer assistance regarding upcoming cardiac imaging study; pt verbalizes understanding of appt date/time, parking situation and where to check in, pre-test NPO status and verified current allergies; name and call back number provided for further questions should they arise  Larey Brick RN Navigator Cardiac Imaging [facility] [phone] office [phone] cell  Patient to arrive at 2:45pm for his 3:15pm scan.

## 2021-10-30 NOTE — Telephone Encounter (Signed)
The patient is scheduled for pre-Watchman CT 12/5. Scheduled the patient for consult with Dr. Lalla Brothers 12/8. He requests to have his procedure before the end of the year. He understands Dr. Lalla Brothers will review CT results at 12/8 visit and if his anatomy is amenable to closure, he will be scheduled 11/12/2021 at 1330 (with 1100 arrival time). He understands he will also get blood work and instructions for procedure at that visit if he is proceeding. He states he has not tested positive for Covid and understands he will need to be tested prior to procedure. He was grateful for call and agrees with plan.

## 2021-11-02 ENCOUNTER — Ambulatory Visit (HOSPITAL_COMMUNITY)
Admission: RE | Admit: 2021-11-02 | Discharge: 2021-11-02 | Disposition: A | Discharge status: Home / Self Care | Payer: Commercial Managed Care - PPO | Source: Ambulatory Visit | Attending: Cardiology | Admitting: Cardiology

## 2021-11-02 ENCOUNTER — Other Ambulatory Visit: Payer: Self-pay

## 2021-11-02 DIAGNOSIS — I480 Paroxysmal atrial fibrillation: Secondary | ICD-10-CM

## 2021-11-02 MED ORDER — IOHEXOL 350 MG/ML SOLN
80.0000 mL | Freq: Once | INTRAVENOUS | Status: AC | PRN
  Administered 2021-11-02: 80 mL via INTRAVENOUS

## 2021-11-04 NOTE — H&P (View-Only) (Signed)
Electrophysiology Office Note:    Date:  11/05/2021   ID:  Derek Moore, Derek Moore Jan 21, 1956, MRN [id number]  PCP:  Raina Mina., MD  [facility] Cardiologist:  Berniece Salines, DO  [facility] Electrophysiologist:  Vickie Epley, MD   Referring MD: Raina Mina., MD   Chief Complaint: Atrial fibrillation  History of Present Illness:    Derek Moore is a 65 y.o. male who presents for an evaluation of atrial fibrillation at the request of Dr. Bettina Gavia. Their medical history includes coronary artery disease post four-vessel bypass, left atrial appendage thrombus, chronic anticoagulation, hyperlipidemia, diabetes, aortic atherosclerosis.  The patient was last seen by Dr. Bettina Gavia October 19, 2021.  The patient has iron deficiency anemia and significant bruising while on oral anticoagulation.  He is hoping to explore options for stroke risk mitigation that would allow him to avoid long-term exposure to anticoagulation.     Past Medical History:  Diagnosis Date   Abnormal EKG 03/09/2018   Acquired hypothyroidism 03/07/2018   Allergy    Aortic atherosclerosis ([place]) 07/17/2020   Formatting of this note might be different from the original. Seen on [facility] 06/2019   Arthritis    Asthma    Cataract    Chronic gout without tophus 03/07/2018   Coronary artery calcification seen on CAT scan 07/17/2020   Formatting of this note might be different from the original. 06/2019. [facility]   Cough syncope 1999   Dental caries    Depression 03/07/2018   Diplopia 10/11/2018   DISH (diffuse idiopathic skeletal hyperostosis) 07/17/2020   Formatting of this note might be different from the original. Seen on CT [facility] 06/2019   Dizzy 05/09/2018   Gastroesophageal reflux disease without esophagitis 03/07/2018   Formatting of this note might be different from the original. Long term   GERD (gastroesophageal reflux disease) 03/07/2018   Gout 03/07/2018   Hay fever    Heel spur, left 02/07/2020   Hyperlipidemia  03/07/2018   Hypothyroidism (acquired) 03/07/2018   Formatting of this note might be different from the original. 1990s.   Mixed hyperlipidemia 03/07/2018   Obesity    Ocular myasthenia gravis ([place]) 07/17/2020   Formatting of this note might be different from the original. Ocular.  Follows at [facility].  Sees neurology and eye doctor.   Palpitation 03/08/2018   Plantar fasciitis 02/07/2020   Pneumonia    Primary osteoarthritis involving multiple joints 07/17/2020   Recurrent sinusitis    Smokeless tobacco use 07/17/2020   Formatting of this note might be different from the original. Discussed chantix.  He wants to use. Did well in past. Off label, he understands.   Tightness of heel cord, left 02/07/2020   Type 2 diabetes mellitus ([place]) 03/07/2018   Type 2 diabetes mellitus without complication, without long-term current use of insulin ([place]) 03/07/2018   Formatting of this note might be different from the original. 2014.    Past Surgical History:  Procedure Laterality Date   CATARACT EXTRACTION W/ INTRAOCULAR LENS  IMPLANT, BILATERAL     COLONOSCOPY W/ POLYPECTOMY  11/27/2012   Colonic polyp, status post polypectomy. Mild sigmoid diverticulosis.   CORONARY ARTERY BYPASS GRAFT N/A 10/27/2020   Procedure: CORONARY ARTERY BYPASS GRAFTING (CABG), ON PUMP, TIMES FOUR, USING LEFT INTERNAL MAMMARY ARTERY AND ENDOSCOPICALLY HARVESTED RIGHT GREATER SAPHENOUS VEIN;  Surgeon: Gaye Pollack, MD;  Location: [place];  Service: Open Heart Surgery;  Laterality: N/A;   detatched retina     ESOPHAGOGASTRODUODENOSCOPY  04/17/2007   Small hiatal hernia. Mild gastritis. Otherwise normal EGD   kidney stone extraction     cystoscopy x 2   KNEE SURGERY Left    bone spurs   LEFT HEART CATH AND CORONARY ANGIOGRAPHY N/A 10/08/2020   Procedure: LEFT HEART CATH AND CORONARY ANGIOGRAPHY;  Surgeon: Leonie Man, MD;  Location: [place] [facility];  Service: Cardiovascular;  Laterality: N/A;   NOSE SURGERY     TEE WITHOUT  CARDIOVERSION N/A 10/27/2020   Procedure: TRANSESOPHAGEAL ECHOCARDIOGRAM (TEE);  Surgeon: Gaye Pollack, MD;  Location: [place];  Service: Open Heart Surgery;  Laterality: N/A;   TEE WITHOUT CARDIOVERSION N/A 01/08/2021   Procedure: TRANSESOPHAGEAL ECHOCARDIOGRAM (TEE);  Surgeon: Dorothy Spark, MD;  Location: [facility] ENDOSCOPY;  Service: Cardiovascular;  Laterality: N/A;    Current Medications: Current Meds  Medication Sig   acetaminophen (TYLENOL) 500 MG tablet Take 500-1,000 mg by mouth every 6 (six) hours as needed (for pain.).   albuterol (PROVENTIL) (2.5 MG/3ML) 0.083% nebulizer solution Take 2.5 mg by nebulization every 6 (six) hours as needed for wheezing or shortness of breath.   albuterol (VENTOLIN HFA) 108 (90 Base) MCG/ACT inhaler Inhale into the lungs every 6 (six) hours as needed for wheezing or shortness of breath.   apixaban (ELIQUIS) 5 MG TABS tablet Take 1 tablet (5 mg total) by mouth 2 (two) times daily.   Dextran 70-Hypromellose, PF, (ARTIFICIAL TEARS PF) 0.1-0.3 % SOLN Place 1 drop into both eyes 3 (three) times daily as needed (dry/irritated eyes.).   ferrous sulfate 325 (65 FE) MG EC tablet Take 1 tablet by mouth every other day.   JARDIANCE 10 MG TABS tablet Take 10 mg by mouth daily.   levothyroxine (SYNTHROID) 137 MCG tablet Take 137 mcg by mouth daily.   metFORMIN (GLUCOPHAGE) 1000 MG tablet Take 1,000 mg by mouth 2 (two) times daily with a meal.   Omega-3 Fatty Acids (FISH OIL ULTRA) 1400 MG CAPS Take 1,400 mg by mouth in the morning and at bedtime.   pantoprazole (PROTONIX) 40 MG tablet Take 40 mg by mouth daily.   PARoxetine (PAXIL) 40 MG tablet Take 40 mg by mouth daily.   predniSONE (DELTASONE) 10 MG tablet Take 10 mg by mouth daily.   rosuvastatin (CRESTOR) 10 MG tablet TAKE 1 TABLET BY MOUTH  DAILY   Semaglutide,0.25 or 0.5MG /DOS, (OZEMPIC, 0.25 OR 0.5 MG/DOSE,) 2 MG/1.5ML SOPN Inject 0.5 mg into the skin every Wednesday.    vitamin B-12 (CYANOCOBALAMIN) 1000  MCG tablet Take 1,000 mcg by mouth daily.     Allergies:   Codeine, Hydrocodone-acetaminophen, Lipitor [atorvastatin], and Latex   Social History   Socioeconomic History   Marital status: Married    Spouse name: Kim   Number of children: 3   Years of education: Not on file   Highest education level: Not on file  Occupational History   Occupation: retired  Tobacco Use   Smoking status: Former    Types: Landscape architect, Pipe    Quit date: 11/30/1999    Years since quitting: 21.9   Smokeless tobacco: Current    Types: Snuff   Tobacco comments:    stopped smoking pipe tobacco 30 years ago  Vaping Use   Vaping Use: Never used  Substance and Sexual Activity   Alcohol use: Yes    Comment: Occasional Beer    Drug use: Never   Sexual activity: Not on file  Other Topics Concern   Not on file  Social  History Narrative   Not on file   Social Determinants of Health   Financial Resource Strain: Not on file  Food Insecurity: Not on file  Transportation Needs: Not on file  Physical Activity: Not on file  Stress: Not on file  Social Connections: Not on file     Family History: The patient's family history includes Blindness in his father; CAD in his father; Cancer in his father; Heart Problems in his mother; Hypertension in his father; Rheum arthritis in his mother. There is no history of Colon cancer, Esophageal cancer, Rectal cancer, or Stomach cancer.  ROS:   Please see the history of present illness.    All other systems reviewed and are negative.  EKGs/Labs/Other Studies Reviewed:    The following studies were reviewed today:  January 08, 2021 transesophageal echo Possible old thrombus attached pectinate muscles and left atrial appendage Left ventricular function normal, 55% Mild LVH RV size is normal Dilated left atrium Mild MR   EKG:  The ekg ordered today demonstrates sinus rhythm  November 02, 2021 CT cardiac IMPRESSION: 1. The left atrial appendage is a large  chicken wing morphology without thrombus.  2. Oval shaped landing zone noted. Based on average diameter (20.5 mm) a 27 mm watchman FLX device would provide 24% compression. A 24 mm device would allow for 14% compression.  3. An inferior posterior IAS puncture site is recommended. There is mild lipomatous hypertrophy of the IAS but this will not affect the IAS puncture site.  4. Optimal deployment angle: RAO 15 CRA 12  5. Normal coronary origin. Right dominance.  S/p CABG.   Recent Labs: 11/11/2020: NT-Pro BNP 247; TSH 29.200 12/19/2020: Magnesium 1.8 01/02/2021: Hemoglobin 13.6; Platelets 221 06/17/2021: ALT 16 10/28/2021: BUN 19; Creatinine, Ser 1.08; Potassium 4.3; Sodium 139  Recent Lipid Panel    Component Value Date/Time   CHOL 136 06/17/2021 0850   TRIG 106 06/17/2021 0850   HDL 50 06/17/2021 0850   CHOLHDL 2.7 06/17/2021 0850   LDLCALC 67 06/17/2021 0850    Physical Exam:    VS:  BP 126/70    Pulse 61    Ht 5\' 6"  (1.676 m)    Wt 197 lb 6.4 oz (89.5 kg)    SpO2 97%    BMI 31.86 kg/m     Wt Readings from Last 3 Encounters:  11/05/21 197 lb 6.4 oz (89.5 kg)  10/19/21 198 lb 12.8 oz (90.2 kg)  05/06/21 194 lb 6.4 oz (88.2 kg)     GEN:  Well nourished, well developed in no acute distress HEENT: Normal NECK: No JVD; No carotid bruits LYMPHATICS: No lymphadenopathy CARDIAC: RRR, no murmurs, rubs, gallops RESPIRATORY:  Clear to auscultation without rales, wheezing or rhonchi  ABDOMEN: Soft, non-tender, non-distended MUSCULOSKELETAL:  No edema; No deformity  SKIN: Warm and dry NEUROLOGIC:  Alert and oriented x 3 PSYCHIATRIC:  Normal affect       ASSESSMENT:    1. PAF (paroxysmal atrial fibrillation) (HCC)   2. Coronary artery disease involving native coronary artery of native heart without angina pectoris   3. S/P CABG x 4   4. Type 2 diabetes mellitus without complication, with long-term current use of insulin (HCC)    PLAN:    In order of problems listed  above:  #Paroxysmal atrial fibrillation Overall low burden of A. fib.  He has experienced a left atrial appendage thrombus in the past that is now resolved on Eliquis.  He desires a strategy for stroke  risk reduction that avoids long-term exposure to anticoagulation.  I discussed left atrial appendage occlusion during today's visit include the risk, recovery and need for short-term anticoagulation.  He would like to proceed with scheduling.  ---------------------  I have seen Derek Moore in the office today who is being considered for a Watchman left atrial appendage closure device. I believe they will benefit from this procedure given their history of atrial fibrillation, CHA2DS2-VASc score of 3 and unadjusted ischemic stroke rate of 3.2% per year.  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  The patient's chart has been reviewed and I feel that they would be a candidate for short term oral anticoagulation after Watchman implant.   It is my belief that after undergoing a LAA closure procedure, Derek Moore will not need long term anticoagulation which eliminates anticoagulation side effects and major bleeding risk.   Procedural risks for the Watchman implant have been reviewed with the patient including a 0.5% risk of stroke, <1% risk of perforation and <1% risk of device embolization.    The published clinical data on the safety and effectiveness of WATCHMAN include but are not limited to the following: - Holmes DR, Mechele Claude, Sick P et al. for the PROTECT AF Investigators. Percutaneous closure of the left atrial appendage versus warfarin therapy for prevention of stroke in patients with atrial fibrillation: a randomised non-inferiority trial. Lancet 2009; 374: 534-42. Mechele Claude, Doshi SK, Abelardo Diesel D et al. on behalf of the PROTECT AF Investigators. Percutaneous  Left Atrial Appendage Closure for Stroke Prophylaxis in Patients With Atrial Fibrillation 2.3-Year Follow-up of the PROTECT AF (Watchman Left Atrial Appendage System for Embolic Protection in Patients With Atrial Fibrillation) Trial. Circulation 2013; 127:720-729. - Alli O, Doshi S,  Kar S, Reddy VY, Sievert H et al. Quality of Life Assessment in the Randomized PROTECT AF (Percutaneous Closure of the Left Atrial Appendage Versus Warfarin Therapy for Prevention of Stroke in Patients With Atrial Fibrillation) Trial of Patients at Risk for Stroke With Nonvalvular Atrial Fibrillation. J Am Coll Cardiol 2013; P4788364. Vertell Limber DR, Tarri Abernethy, Price M, Silas, Sievert H, Doshi S, Huber K, Reddy V. Prospective randomized evaluation of the Watchman left atrial appendage Device in patients with atrial fibrillation versus long-term warfarin therapy; the PREVAIL trial. Journal of the [employer] of Cardiology, Vol. 4, No. 1, 2014, 1-11. - Kar S, Doshi SK, Sadhu A, Horton R, Osorio J et al. Primary outcome evaluation of a next-generation left atrial appendage closure device: results from the PINNACLE FLX trial. Circulation 2021;143(18)1754-1762.    After today's visit with the patient which was dedicated solely for shared decision making visit regarding LAA closure device, the patient decided to proceed with the LAA appendage closure procedure scheduled to be done in the near future at [facility].   HAS-BLED score 1 Hypertension No  Abnormal renal and liver function (Dialysis, transplant, Cr >2.26 mg/dL /Cirrhosis or Bilirubin >2x Normal or AST/ALT/AP >3x Normal) No  Stroke No  Bleeding No  Labile INR (Unstable/high INR) No  Elderly (>65) Yes  Drugs or alcohol (? 8 drinks/week, anti-plt or NSAID) No       Total time spent with patient today 65 minutes. This includes reviewing records, evaluating the patient and coordinating care.  Medication Adjustments/Labs and Tests Ordered: Current  medicines are reviewed at length with the patient today.  Concerns regarding medicines are outlined above.  Orders Placed This Encounter  Procedures   CBC w/Diff   Basic Metabolic Panel (BMET)   EKG 12-Lead   No orders of the defined types were placed in this encounter.    Signed, Hilton Cork. Quentin Ore, MD, [facility], [facility] 11/05/2021 9:43 AM    Electrophysiology [facility]

## 2021-11-04 NOTE — Progress Notes (Addendum)
Electrophysiology Office Note:    Date:  01/01/2022   ID:  Derek Moore, Derek Moore [birth year], MRN [id number]  PCP:  Raina Mina., MD  [facility] Cardiologist:  Berniece Salines, DO  [facility] Electrophysiologist:  Vickie Epley, MD   Referring MD: Raina Mina., MD   Chief Complaint: Atrial fibrillation  History of Present Illness:    Derek Moore is a 65 y.o. male who presents for an evaluation of atrial fibrillation at the request of Dr. Bettina Gavia. Their medical history includes coronary artery disease post four-vessel bypass, left atrial appendage thrombus, chronic anticoagulation, hyperlipidemia, diabetes, aortic atherosclerosis.  The patient was last seen by Dr. Bettina Gavia October 19, 2021.  The patient has iron deficiency anemia and significant bruising while on oral anticoagulation.  He is hoping to explore options for stroke risk mitigation that would allow him to avoid long-term exposure to anticoagulation.     Past Medical History:  Diagnosis Date   Abnormal EKG 03/09/2018   Acquired hypothyroidism 03/07/2018   Allergy    Aortic atherosclerosis ([place]) 07/17/2020   Formatting of this note might be different from the original. Seen on [facility] 06/2019   Arthritis    Asthma    CAD (coronary artery disease), native coronary artery    Cataract    Chronic gout without tophus 03/07/2018   Coronary artery calcification seen on CAT scan 07/17/2020   Formatting of this note might be different from the original. 06/2019. [facility]   Cough syncope 1999   Dental caries    Depression 03/07/2018   Diplopia 10/11/2018   DISH (diffuse idiopathic skeletal hyperostosis) 07/17/2020   Formatting of this note might be different from the original. Seen [facility] 06/2019   Dizzy 05/09/2018   Gastroesophageal reflux disease without esophagitis 03/07/2018   Formatting of this note might be different from the original. Long term   GERD (gastroesophageal reflux disease) 03/07/2018   Gout  03/07/2018   Hay fever    Heel spur, left 02/07/2020   Hyperlipidemia 03/07/2018   Hypothyroidism (acquired) 03/07/2018   Formatting of this note might be different from the original. 1990s.   Mixed hyperlipidemia 03/07/2018   Obesity    Ocular myasthenia gravis ([place]) 07/17/2020   Formatting of this note might be different from the original. Ocular.  Follows at [facility].  Sees neurology and eye doctor.   Palpitation 03/08/2018   Plantar fasciitis 02/07/2020   Pneumonia    Presence of Watchman left atrial appendage closure device 11/12/2021   with 63mm Watchman FLX woth Dr. Quentin Ore   Primary osteoarthritis involving multiple joints 07/17/2020   Recurrent sinusitis    Smokeless tobacco use 07/17/2020   Formatting of this note might be different from the original. Discussed chantix.  He wants to use. Did well in past. Off label, he understands.   Thrombus of left atrial appendage    Tightness of heel cord, left 02/07/2020   Type 2 diabetes mellitus ([place]) 03/07/2018   Type 2 diabetes mellitus without complication, without long-term current use of insulin ([place]) 03/07/2018   Formatting of this note might be different from the original. 2014.    Past Surgical History:  Procedure Laterality Date   CATARACT EXTRACTION W/ INTRAOCULAR LENS  IMPLANT, BILATERAL     COLONOSCOPY W/ POLYPECTOMY  11/27/2012   Colonic polyp, status post polypectomy. Mild sigmoid diverticulosis.   CORONARY ARTERY BYPASS GRAFT N/A 10/27/2020   Procedure: CORONARY ARTERY BYPASS GRAFTING (CABG), ON PUMP, TIMES FOUR, USING  LEFT INTERNAL MAMMARY ARTERY AND ENDOSCOPICALLY HARVESTED RIGHT GREATER SAPHENOUS VEIN;  Surgeon: [name], MD;  Location: [facility] OR;  Service: Open Heart Surgery;  Laterality: N/A;   detatched retina     ESOPHAGOGASTRODUODENOSCOPY  04/17/2007   Small hiatal hernia. Mild gastritis. Otherwise normal EGD   kidney stone extraction     cystoscopy x 2   KNEE SURGERY Left    bone spurs   LEFT ATRIAL  APPENDAGE OCCLUSION N/A 11/12/2021   Procedure: LEFT ATRIAL APPENDAGE OCCLUSION;  Surgeon: Lanier Prude, MD;  Location: [facility] INVASIVE CV LAB;  Service: Cardiovascular;  Laterality: N/A;   LEFT HEART CATH AND CORONARY ANGIOGRAPHY N/A 10/08/2020   Procedure: LEFT HEART CATH AND CORONARY ANGIOGRAPHY;  Surgeon: Marykay Lex, MD;  Location: [facility] INVASIVE CV LAB;  Service: Cardiovascular;  Laterality: N/A;   NOSE SURGERY     TEE WITHOUT CARDIOVERSION N/A 10/27/2020   Procedure: TRANSESOPHAGEAL ECHOCARDIOGRAM (TEE);  Surgeon: [name], MD;  Location: [facility] OR;  Service: Open Heart Surgery;  Laterality: N/A;   TEE WITHOUT CARDIOVERSION N/A 01/08/2021   Procedure: TRANSESOPHAGEAL ECHOCARDIOGRAM (TEE);  Surgeon: Lars Masson, MD;  Location[facility] ENDOSCOPY;  Service: Cardiovascular;  Laterality: N/A;   TEE WITHOUT CARDIOVERSION N/A 11/12/2021   Procedure: TRANSESOPHAGEAL ECHOCARDIOGRAM (TEE);  Surgeon: Lanier Prude, MD;  Location: [facility] INVASIVE CV LAB;  Service: Cardiovascular;  Laterality: N/A;   TEE WITHOUT CARDIOVERSION N/A 12/29/2021   Procedure: TRANSESOPHAGEAL ECHOCARDIOGRAM (TEE);  Surgeon: Thurmon Fair, MD;  Location: [facility] ENDOSCOPY;  Service: Cardiovascular;  Laterality: N/A;    Current Medications: Current Meds  Medication Sig   acetaminophen (TYLENOL) 500 MG tablet Take 500-1,000 mg by mouth every 6 (six) hours as needed (for pain.).   albuterol (PROVENTIL) (2.5 MG/3ML) 0.083% nebulizer solution Take 2.5 mg by nebulization every 6 (six) hours as needed for wheezing or shortness of breath.   albuterol (VENTOLIN HFA) 108 (90 Base) MCG/ACT inhaler Inhale into the lungs every 6 (six) hours as needed for wheezing or shortness of breath.   Dextran 70-Hypromellose, PF, (ARTIFICIAL TEARS PF) 0.1-0.3 % SOLN Place 1 drop into both eyes 3 (three) times daily as needed (dry/irritated eyes.).   ferrous sulfate 325 (65 FE) MG EC tablet Take 325 mg by mouth 3 (three) times a week.    JARDIANCE 10 MG TABS tablet Take 10 mg by mouth daily.   levothyroxine (SYNTHROID) 137 MCG tablet Take 137 mcg by mouth daily before breakfast.   metFORMIN (GLUCOPHAGE) 1000 MG tablet Take 1,000 mg by mouth 2 (two) times daily with a meal.   Omega-3 Fatty Acids (FISH OIL ULTRA) 1400 MG CAPS Take 1,400 mg by mouth in the morning and at bedtime.   pantoprazole (PROTONIX) 40 MG tablet Take 40 mg by mouth daily.   PARoxetine (PAXIL) 40 MG tablet Take 40 mg by mouth daily.   rosuvastatin (CRESTOR) 10 MG tablet TAKE 1 TABLET BY MOUTH  DAILY   Semaglutide,0.25 or 0.5MG /DOS, (OZEMPIC, 0.25 OR 0.5 MG/DOSE,) 2 MG/1.5ML SOPN Inject 0.5 mg into the skin every Wednesday.    vitamin B-12 (CYANOCOBALAMIN) 1000 MCG tablet Take 1,000 mcg by mouth daily.   [DISCONTINUED] apixaban (ELIQUIS) 5 MG TABS tablet Take 1 tablet (5 mg total) by mouth 2 (two) times daily.   [DISCONTINUED] predniSONE (DELTASONE) 10 MG tablet Take 10 mg by mouth daily. (Patient not taking: Reported on 12/14/2021)     Allergies:   Codeine, Hydrocodone-acetaminophen, Lipitor [atorvastatin], and Latex   Social History  Socioeconomic History   Marital status: Married    Spouse name: Maudie Mercury   Number of children: 3   Years of education: Not on file   Highest education level: Not on file  Occupational History   Occupation: retired  Tobacco Use   Smoking status: Former    Types: Landscape architect, Pipe    Quit date: 11/30/1999    Years since quitting: 22.1   Smokeless tobacco: Current    Types: Snuff   Tobacco comments:    stopped smoking pipe tobacco 30 years ago  Vaping Use   Vaping Use: Never used  Substance and Sexual Activity   Alcohol use: Yes    Comment: Occasional Beer    Drug use: Never   Sexual activity: Not on file  Other Topics Concern   Not on file  Social History Narrative   Not on file   Social Determinants of Health   Financial Resource Strain: Not on file  Food Insecurity: Not on file  Transportation Needs: Not on file   Physical Activity: Not on file  Stress: Not on file  Social Connections: Not on file     Family History: The patient's family history includes Blindness in his father; CAD in his father; Cancer in his father; Heart Problems in his mother; Hypertension in his father; Rheum arthritis in his mother. There is no history of Colon cancer, Esophageal cancer, Rectal cancer, or Stomach cancer.  ROS:   Please see the history of present illness.    All other systems reviewed and are negative.  EKGs/Labs/Other Studies Reviewed:    The following studies were reviewed today:  January 08, 2021 transesophageal echo Possible old thrombus attached pectinate muscles and left atrial appendage Left ventricular function normal, 55% Mild LVH RV size is normal Dilated left atrium Mild MR   EKG:  The ekg ordered today demonstrates sinus rhythm  November 02, 2021 CT cardiac IMPRESSION: 1. The left atrial appendage is a large chicken wing morphology without thrombus.  2. Oval shaped landing zone noted. Based on average diameter (20.5 mm) a 27 mm watchman FLX device would provide 24% compression. A 24 mm device would allow for 14% compression.  3. An inferior posterior IAS puncture site is recommended. There is mild lipomatous hypertrophy of the IAS but this will not affect the IAS puncture site.  4. Optimal deployment angle: RAO 15 CRA 12  5. Normal coronary origin. Right dominance.  S/p CABG.   Recent Labs: 06/17/2021: ALT 16 12/14/2021: BUN 23; Creatinine, Ser 1.08; Hemoglobin 14.9; Platelets 200; Potassium 4.3; Sodium 138  Recent Lipid Panel    Component Value Date/Time   CHOL 136 06/17/2021 0850   TRIG 106 06/17/2021 0850   HDL 50 06/17/2021 0850   CHOLHDL 2.7 06/17/2021 0850   LDLCALC 67 06/17/2021 0850    Physical Exam:    VS:  BP 126/70   Pulse 61   Ht 5\' 6"  (1.676 m)   Wt 197 lb 6.4 oz (89.5 kg)   SpO2 97%   BMI 31.86 kg/m     Wt Readings from Last 3 Encounters:   12/29/21 190 lb (86.2 kg)  12/14/21 198 lb 12.8 oz (90.2 kg)  11/12/21 190 lb (86.2 kg)     GEN:  Well nourished, well developed in no acute distress HEENT: Normal NECK: No JVD; No carotid bruits LYMPHATICS: No lymphadenopathy CARDIAC: RRR, no murmurs, rubs, gallops RESPIRATORY:  Clear to auscultation without rales, wheezing or rhonchi  ABDOMEN: Soft, non-tender, non-distended MUSCULOSKELETAL:  No edema; No deformity  SKIN: Warm and dry NEUROLOGIC:  Alert and oriented x 3 PSYCHIATRIC:  Normal affect       ASSESSMENT:    1. PAF (paroxysmal atrial fibrillation) ([place])   2. Coronary artery disease involving native coronary artery of native heart without angina pectoris   3. S/P CABG x 4   4. Type 2 diabetes mellitus without complication, with long-term current use of insulin (HCC)    PLAN:    In order of problems listed above:  #Paroxysmal atrial fibrillation Overall low burden of A. fib.  He has experienced a left atrial appendage thrombus in the past that is now resolved on Eliquis.  He desires a strategy for stroke risk reduction that avoids long-term exposure to anticoagulation.  I discussed left atrial appendage occlusion during today's visit include the risk, recovery and need for short-term anticoagulation.  He would like to proceed with scheduling.  ---------------------  I have seen Alyson Locket Gucciardo in the office today who is being considered for a Watchman left atrial appendage closure device. I believe they will benefit from this procedure given their history of atrial fibrillation, CHA2DS2-VASc score of 3 and unadjusted ischemic stroke rate of 3.2% per year.  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  The patient's chart has been reviewed and I feel that they would be a candidate for short term oral anticoagulation after Watchman implant.   It is my  belief that after undergoing a LAA closure procedure, KHALIEF DIEGEL will not need long term anticoagulation which eliminates anticoagulation side effects and major bleeding risk.   Procedural risks for the Watchman implant have been reviewed with the patient including a 0.5% risk of stroke, <1% risk of perforation and <1% risk of device embolization.    The published clinical data on the safety and effectiveness of WATCHMAN include but are not limited to the following: - Holmes DR, Mechele Claude, Sick P et al. for the PROTECT AF Investigators. Percutaneous closure of the left atrial appendage versus warfarin therapy for prevention of stroke in patients with atrial fibrillation: a randomised non-inferiority trial. Lancet 2009; 374: 534-42. Mechele Claude, Doshi SK, Abelardo Diesel D et al. on behalf of the PROTECT AF Investigators. Percutaneous Left Atrial Appendage Closure for Stroke Prophylaxis in Patients With Atrial Fibrillation 2.3-Year Follow-up of the PROTECT AF (Watchman Left Atrial Appendage System for Embolic Protection in Patients With Atrial Fibrillation) Trial. Circulation 2013; 127:720-729. - Alli O, Doshi S,  Kar S, Reddy VY, Sievert H et al. Quality of Life Assessment in the Randomized PROTECT AF (Percutaneous Closure of the Left Atrial Appendage Versus Warfarin Therapy for Prevention of Stroke in Patients With Atrial Fibrillation) Trial of Patients at Risk for Stroke With Nonvalvular Atrial Fibrillation. J Am Coll Cardiol 2013; P4788364. Vertell Limber DR, Tarri Abernethy, Price M, [place], Sievert H, Doshi S, Huber K, Reddy V. Prospective randomized evaluation of the Watchman left atrial appendage Device in patients with atrial fibrillation versus long-term warfarin therapy; the PREVAIL trial. Journal of the [employer] of Cardiology, Vol. 4, No. 1, 2014, 1-11. - Kar S, Doshi SK, Sadhu A, Horton R, Osorio J et al. Primary outcome evaluation of a next-generation left atrial appendage closure device:  results from the PINNACLE FLX trial. Circulation 2021;143(18)1754-1762.    After today's visit with the patient which was dedicated solely for shared decision making visit  regarding LAA closure device, the patient decided to proceed with the LAA appendage closure procedure scheduled to be done in the near future at [facility].   HAS-BLED score 1 Hypertension No  Abnormal renal and liver function (Dialysis, transplant, Cr >2.26 mg/dL /Cirrhosis or Bilirubin >2x Normal or AST/ALT/AP >3x Normal) No  Stroke No  Bleeding No  Labile INR (Unstable/high INR) No  Elderly (>65) Yes  Drugs or alcohol (? 8 drinks/week, anti-plt or NSAID) No       Total time spent with patient today 65 minutes. This includes reviewing records, evaluating the patient and coordinating care.  Medication Adjustments/Labs and Tests Ordered: Current medicines are reviewed at length with the patient today.  Concerns regarding medicines are outlined above.  Orders Placed This Encounter  Procedures   CBC w/Diff   Basic Metabolic Panel (BMET)   EKG 12-Lead   No orders of the defined types were placed in this encounter.     Signed, Hilton Cork. Quentin Ore, MD, [facility], [facility] 01/01/2022 11:00 AM    Electrophysiology [facility]   CHA2DS2-VASc Score = 3  The patient's score is based upon: CHF History: 0 HTN History: 0 Diabetes History: 1 Stroke History: 0 Vascular Disease History: 1 Age Score: 1 Gender Score: 0

## 2021-11-05 ENCOUNTER — Ambulatory Visit (INDEPENDENT_AMBULATORY_CARE_PROVIDER_SITE_OTHER): Payer: Commercial Managed Care - PPO | Admitting: Cardiology

## 2021-11-05 ENCOUNTER — Encounter: Payer: Self-pay | Admitting: Cardiology

## 2021-11-05 ENCOUNTER — Other Ambulatory Visit: Payer: Self-pay

## 2021-11-05 VITALS — BP 126/70 | HR 61 | Ht 66.0 in | Wt 197.4 lb

## 2021-11-05 DIAGNOSIS — Z7901 Long term (current) use of anticoagulants: Secondary | ICD-10-CM

## 2021-11-05 DIAGNOSIS — E119 Type 2 diabetes mellitus without complications: Secondary | ICD-10-CM | POA: Diagnosis not present

## 2021-11-05 DIAGNOSIS — Z951 Presence of aortocoronary bypass graft: Secondary | ICD-10-CM

## 2021-11-05 DIAGNOSIS — T148XXA Other injury of unspecified body region, initial encounter: Secondary | ICD-10-CM

## 2021-11-05 DIAGNOSIS — I480 Paroxysmal atrial fibrillation: Secondary | ICD-10-CM | POA: Diagnosis not present

## 2021-11-05 DIAGNOSIS — I2510 Atherosclerotic heart disease of native coronary artery without angina pectoris: Secondary | ICD-10-CM | POA: Diagnosis not present

## 2021-11-05 DIAGNOSIS — Z794 Long term (current) use of insulin: Secondary | ICD-10-CM

## 2021-11-05 LAB — CBC WITH DIFFERENTIAL/PLATELET
Basophils Absolute: 0 10*3/uL (ref 0.0–0.2)
Basos: 0 %
EOS (ABSOLUTE): 0.1 10*3/uL (ref 0.0–0.4)
Eos: 2 %
Hematocrit: 42.5 % (ref 37.5–51.0)
Hemoglobin: 14.2 g/dL (ref 13.0–17.7)
Lymphocytes Absolute: 1.5 10*3/uL (ref 0.7–3.1)
Lymphs: 25 %
MCH: 30.1 pg (ref 26.6–33.0)
MCHC: 33.4 g/dL (ref 31.5–35.7)
MCV: 90 fL (ref 79–97)
Monocytes Absolute: 0.7 10*3/uL (ref 0.1–0.9)
Monocytes: 13 %
Neutrophils Absolute: 3.5 10*3/uL (ref 1.4–7.0)
Neutrophils: 60 %
Platelets: 152 10*3/uL (ref 150–450)
RBC: 4.71 x10E6/uL (ref 4.14–5.80)
RDW: 15.7 % — ABNORMAL HIGH (ref 11.6–15.4)
WBC: 5.8 10*3/uL (ref 3.4–10.8)

## 2021-11-05 LAB — BASIC METABOLIC PANEL
BUN/Creatinine Ratio: 19 (ref 10–24)
BUN: 19 mg/dL (ref 8–27)
CO2: 29 mmol/L (ref 20–29)
Calcium: 9.1 mg/dL (ref 8.6–10.2)
Chloride: 102 mmol/L (ref 96–106)
Creatinine, Ser: 0.99 mg/dL (ref 0.76–1.27)
Glucose: 214 mg/dL — ABNORMAL HIGH (ref 70–99)
Potassium: 4.2 mmol/L (ref 3.5–5.2)
Sodium: 139 mmol/L (ref 134–144)
eGFR: 84 mL/min/{1.73_m2} (ref >59)

## 2021-11-05 NOTE — Patient Instructions (Addendum)
Medication Instructions:  Your physician recommends that you continue on your current medications as directed. Please refer to the Current Medication list given to you today. *If you need a refill on your cardiac medications before your next appointment, please call your pharmacy*  Lab Work: CBC, BMP If you have labs (blood work) drawn today and your tests are completely normal, you will receive your results only by: MyChart Message (if you have MyChart) OR A paper copy in the mail If you have any lab test that is abnormal or we need to change your treatment, we will call you to review the results.   COVID SCREENING INFORMATION (12/13): Please proceed to have your Covid test on 11/10/2021 at 11:00AM. [facility] - Main Entrance A. Pull through to the valet parking stand and tell them you are scheduled for a 5 minute Covid test. They will direct you where to park.  Proceed directly to the Pre-Admission Testing department (bypass Admitting). You will need to go home and quarantine after your Covid test until your procedure.   LEFT ATRIAL APPENDAGE CLOSURE INSTRUCTIONS (12/15): You are scheduled for a Left Atrial Appendage Device Implantation on Thursday, December 15 with Dr. Steffanie Dunn.  1. Please arrive at the [facility] (Main Entrance A) at [facility]: [address] [place], [place] [address] at 11:00 AM (This time is 2.5 hours before your procedure to ensure your preparation). Free valet parking service is available. You are allowed ONE visitor in the waiting room during your procedure. Both you and your guest must wear masks. Special note: Every effort is made to have your procedure done on time. Please understand that emergencies sometimes delay scheduled procedures.  2.   Do not eat or drink after midnight prior to your procedure.     3.   Medication Instructions:  1) Take all medications as directed throughout the day before surgery.  2) The morning of  surgery, do not take any medications.   4. The day of surgery, please wear clean and comfortable clothes that are easy to get on and off and wear slip-on shoes. Do not wear lotions, powders, perfumes/colognes, or deodorant. Brush your teeth with your regular toothpaste. Do not wear jewelry, make up, or nail polish (including SNS, gel, acrylic, etc.). Do not bring valuables to the hospital - [facility] is not responsible for any belongings or valuables.  5. Plan for one night stay. When you are discharged, you will need someone to drive you home and stay with you for 24 hours.  6. Bring a current list of your medications and current insurance cards.  7. Jamelle Rushing Nurse, will arrange your post-procedure visits during your hospital stay. You will be discharged with dates and times of your follow-up appointments and 45-day TEE (TEE instructions will be reviewed at the follow-up visit). [facility]'s direct number is [phone] if you need assistance.  **If you have any questions, do not hesitate to call! You will also be contacted the week of your procedure to confirm instructions.**

## 2021-11-06 ENCOUNTER — Telehealth: Payer: Self-pay

## 2021-11-06 NOTE — Telephone Encounter (Signed)
Informed the patient that his new arrival time for procedure is 0515 for 0730 case 11/12/21. Confirmed Covid appointment 12/13. He understands he will only be called again if Covid test is positive and rearrangements needs to be made. He was grateful for call and agrees with plan.

## 2021-11-10 ENCOUNTER — Other Ambulatory Visit (HOSPITAL_COMMUNITY)
Admission: RE | Admit: 2021-11-10 | Discharge: 2021-11-10 | Disposition: A | Discharge status: Home / Self Care | Payer: Commercial Managed Care - PPO | Source: Ambulatory Visit | Attending: Cardiology | Admitting: Cardiology

## 2021-11-10 DIAGNOSIS — Z01812 Encounter for preprocedural laboratory examination: Secondary | ICD-10-CM | POA: Insufficient documentation

## 2021-11-10 DIAGNOSIS — Z01818 Encounter for other preprocedural examination: Secondary | ICD-10-CM

## 2021-11-10 DIAGNOSIS — Z20822 Contact with and (suspected) exposure to covid-19: Secondary | ICD-10-CM | POA: Insufficient documentation

## 2021-11-10 LAB — SARS CORONAVIRUS 2 (TAT 6-24 HRS): SARS Coronavirus 2: NEGATIVE

## 2021-11-12 ENCOUNTER — Inpatient Hospital Stay (HOSPITAL_COMMUNITY): Payer: Commercial Managed Care - PPO

## 2021-11-12 ENCOUNTER — Encounter (HOSPITAL_COMMUNITY): Payer: Self-pay | Admitting: Cardiology

## 2021-11-12 ENCOUNTER — Inpatient Hospital Stay (HOSPITAL_COMMUNITY): Payer: Commercial Managed Care - PPO | Admitting: Anesthesiology

## 2021-11-12 ENCOUNTER — Other Ambulatory Visit: Payer: Self-pay

## 2021-11-12 ENCOUNTER — Encounter (HOSPITAL_COMMUNITY)
Admission: AD | Disposition: A | Discharge status: Home / Self Care | Payer: Self-pay | Source: Home / Self Care | Attending: Cardiology

## 2021-11-12 ENCOUNTER — Inpatient Hospital Stay (HOSPITAL_COMMUNITY)
Admission: AD | Admit: 2021-11-12 | Discharge: 2021-11-12 | DRG: 274 | Disposition: A | Discharge status: Home / Self Care | Payer: Commercial Managed Care - PPO | Attending: Cardiology | Admitting: Cardiology

## 2021-11-12 DIAGNOSIS — T148XXA Other injury of unspecified body region, initial encounter: Secondary | ICD-10-CM

## 2021-11-12 DIAGNOSIS — Z7984 Long term (current) use of oral hypoglycemic drugs: Secondary | ICD-10-CM

## 2021-11-12 DIAGNOSIS — J45909 Unspecified asthma, uncomplicated: Secondary | ICD-10-CM | POA: Diagnosis present

## 2021-11-12 DIAGNOSIS — I480 Paroxysmal atrial fibrillation: Secondary | ICD-10-CM | POA: Diagnosis not present

## 2021-11-12 DIAGNOSIS — I2510 Atherosclerotic heart disease of native coronary artery without angina pectoris: Secondary | ICD-10-CM | POA: Diagnosis present

## 2021-11-12 DIAGNOSIS — Z888 Allergy status to other drugs, medicaments and biological substances status: Secondary | ICD-10-CM | POA: Diagnosis not present

## 2021-11-12 DIAGNOSIS — E1169 Type 2 diabetes mellitus with other specified complication: Secondary | ICD-10-CM | POA: Diagnosis present

## 2021-11-12 DIAGNOSIS — M1990 Unspecified osteoarthritis, unspecified site: Secondary | ICD-10-CM | POA: Diagnosis present

## 2021-11-12 DIAGNOSIS — Z006 Encounter for examination for normal comparison and control in clinical research program: Secondary | ICD-10-CM

## 2021-11-12 DIAGNOSIS — Z9841 Cataract extraction status, right eye: Secondary | ICD-10-CM | POA: Diagnosis not present

## 2021-11-12 DIAGNOSIS — Z809 Family history of malignant neoplasm, unspecified: Secondary | ICD-10-CM

## 2021-11-12 DIAGNOSIS — I10 Essential (primary) hypertension: Secondary | ICD-10-CM | POA: Diagnosis present

## 2021-11-12 DIAGNOSIS — Z961 Presence of intraocular lens: Secondary | ICD-10-CM | POA: Diagnosis present

## 2021-11-12 DIAGNOSIS — I700 Atherosclerosis of aorta: Secondary | ICD-10-CM | POA: Diagnosis present

## 2021-11-12 DIAGNOSIS — F32A Depression, unspecified: Secondary | ICD-10-CM | POA: Diagnosis present

## 2021-11-12 DIAGNOSIS — Z20822 Contact with and (suspected) exposure to covid-19: Secondary | ICD-10-CM | POA: Diagnosis present

## 2021-11-12 DIAGNOSIS — D509 Iron deficiency anemia, unspecified: Secondary | ICD-10-CM | POA: Diagnosis present

## 2021-11-12 DIAGNOSIS — Z86010 Personal history of colonic polyps: Secondary | ICD-10-CM | POA: Diagnosis not present

## 2021-11-12 DIAGNOSIS — Z91040 Latex allergy status: Secondary | ICD-10-CM

## 2021-11-12 DIAGNOSIS — Z95818 Presence of other cardiac implants and grafts: Secondary | ICD-10-CM

## 2021-11-12 DIAGNOSIS — G7000 Myasthenia gravis without (acute) exacerbation: Secondary | ICD-10-CM | POA: Diagnosis present

## 2021-11-12 DIAGNOSIS — E119 Type 2 diabetes mellitus without complications: Secondary | ICD-10-CM

## 2021-11-12 DIAGNOSIS — Z7901 Long term (current) use of anticoagulants: Secondary | ICD-10-CM | POA: Diagnosis not present

## 2021-11-12 DIAGNOSIS — Z951 Presence of aortocoronary bypass graft: Secondary | ICD-10-CM | POA: Diagnosis not present

## 2021-11-12 DIAGNOSIS — E039 Hypothyroidism, unspecified: Secondary | ICD-10-CM | POA: Diagnosis present

## 2021-11-12 DIAGNOSIS — Z9842 Cataract extraction status, left eye: Secondary | ICD-10-CM | POA: Diagnosis not present

## 2021-11-12 DIAGNOSIS — F17290 Nicotine dependence, other tobacco product, uncomplicated: Secondary | ICD-10-CM | POA: Diagnosis present

## 2021-11-12 DIAGNOSIS — Z8261 Family history of arthritis: Secondary | ICD-10-CM

## 2021-11-12 DIAGNOSIS — Z79899 Other long term (current) drug therapy: Secondary | ICD-10-CM

## 2021-11-12 DIAGNOSIS — I4891 Unspecified atrial fibrillation: Secondary | ICD-10-CM

## 2021-11-12 DIAGNOSIS — K219 Gastro-esophageal reflux disease without esophagitis: Secondary | ICD-10-CM | POA: Diagnosis present

## 2021-11-12 DIAGNOSIS — Z885 Allergy status to narcotic agent status: Secondary | ICD-10-CM | POA: Diagnosis not present

## 2021-11-12 DIAGNOSIS — E782 Mixed hyperlipidemia: Secondary | ICD-10-CM | POA: Diagnosis present

## 2021-11-12 DIAGNOSIS — Z794 Long term (current) use of insulin: Secondary | ICD-10-CM

## 2021-11-12 DIAGNOSIS — Z7952 Long term (current) use of systemic steroids: Secondary | ICD-10-CM

## 2021-11-12 DIAGNOSIS — E785 Hyperlipidemia, unspecified: Secondary | ICD-10-CM | POA: Diagnosis present

## 2021-11-12 DIAGNOSIS — Z8249 Family history of ischemic heart disease and other diseases of the circulatory system: Secondary | ICD-10-CM

## 2021-11-12 DIAGNOSIS — Z87442 Personal history of urinary calculi: Secondary | ICD-10-CM

## 2021-11-12 DIAGNOSIS — Z821 Family history of blindness and visual loss: Secondary | ICD-10-CM

## 2021-11-12 DIAGNOSIS — Z79890 Hormone replacement therapy (postmenopausal): Secondary | ICD-10-CM

## 2021-11-12 HISTORY — DX: Atherosclerotic heart disease of native coronary artery without angina pectoris: I25.10

## 2021-11-12 HISTORY — PX: TEE WITHOUT CARDIOVERSION: SHX5443

## 2021-11-12 HISTORY — PX: LEFT ATRIAL APPENDAGE OCCLUSION: EP1229

## 2021-11-12 HISTORY — DX: Presence of other cardiac implants and grafts: Z95.818

## 2021-11-12 HISTORY — DX: Paroxysmal atrial fibrillation: I48.0

## 2021-11-12 HISTORY — DX: Intracardiac thrombosis, not elsewhere classified: I51.3

## 2021-11-12 LAB — POCT ACTIVATED CLOTTING TIME: Activated Clotting Time: 354 seconds

## 2021-11-12 LAB — GLUCOSE, CAPILLARY
Glucose-Capillary: 176 mg/dL — ABNORMAL HIGH (ref 70–99)
Glucose-Capillary: 197 mg/dL — ABNORMAL HIGH (ref 70–99)

## 2021-11-12 LAB — SURGICAL PCR SCREEN
MRSA, PCR: NEGATIVE
Staphylococcus aureus: NEGATIVE

## 2021-11-12 LAB — TYPE AND SCREEN
ABO/RH(D): AB POS
Antibody Screen: NEGATIVE

## 2021-11-12 SURGERY — LEFT ATRIAL APPENDAGE OCCLUSION
Anesthesia: General

## 2021-11-12 MED ORDER — MIDAZOLAM HCL 2 MG/2ML IJ SOLN
INTRAMUSCULAR | Status: AC
  Filled 2021-11-12: qty 2

## 2021-11-12 MED ORDER — DEXAMETHASONE SODIUM PHOSPHATE 10 MG/ML IJ SOLN
INTRAMUSCULAR | Status: DC | PRN
  Administered 2021-11-12: 10 mg via INTRAVENOUS

## 2021-11-12 MED ORDER — EPHEDRINE SULFATE-NACL 50-0.9 MG/10ML-% IV SOSY
PREFILLED_SYRINGE | INTRAVENOUS | Status: DC | PRN
  Administered 2021-11-12: 40 mg via INTRAVENOUS
  Administered 2021-11-12: 5 mg via INTRAVENOUS
  Administered 2021-11-12: 10 mg via INTRAVENOUS

## 2021-11-12 MED ORDER — SODIUM CHLORIDE 0.9% FLUSH: 3.0000 mL | Freq: Two times a day (BID) | INTRAVENOUS | Status: DC

## 2021-11-12 MED ORDER — SODIUM CHLORIDE 0.9 % IV SOLN: INTRAVENOUS | Status: DC

## 2021-11-12 MED ORDER — PROTAMINE SULFATE 10 MG/ML IV SOLN
INTRAVENOUS | Status: DC | PRN
  Administered 2021-11-12 (×2): 15 mg via INTRAVENOUS

## 2021-11-12 MED ORDER — HEPARIN (PORCINE) IN NACL 2000-0.9 UNIT/L-% IV SOLN
INTRAVENOUS | Status: AC
  Filled 2021-11-12: qty 1000

## 2021-11-12 MED ORDER — HEPARIN SODIUM (PORCINE) 1000 UNIT/ML IJ SOLN
INTRAMUSCULAR | Status: DC | PRN
  Administered 2021-11-12: 12000 [IU] via INTRAVENOUS

## 2021-11-12 MED ORDER — CHLORHEXIDINE GLUCONATE 0.12 % MT SOLN
15.0000 mL | Freq: Once | OROMUCOSAL | Status: AC
  Administered 2021-11-12: 15 mL via OROMUCOSAL
  Filled 2021-11-12 (×2): qty 15

## 2021-11-12 MED ORDER — MIDAZOLAM HCL 5 MG/5ML IJ SOLN
INTRAMUSCULAR | Status: DC | PRN
  Administered 2021-11-12: 2 mg via INTRAVENOUS

## 2021-11-12 MED ORDER — PHENYLEPHRINE 40 MCG/ML (10ML) SYRINGE FOR IV PUSH (FOR BLOOD PRESSURE SUPPORT)
PREFILLED_SYRINGE | INTRAVENOUS | Status: DC | PRN
  Administered 2021-11-12: 80 ug via INTRAVENOUS
  Administered 2021-11-12: 40 ug via INTRAVENOUS

## 2021-11-12 MED ORDER — PANTOPRAZOLE SODIUM 40 MG PO TBEC
40.0000 mg | DELAYED_RELEASE_TABLET | Freq: Every day | ORAL | Status: DC
  Administered 2021-11-12: 40 mg via ORAL
  Filled 2021-11-12: qty 1

## 2021-11-12 MED ORDER — APIXABAN 5 MG PO TABS
5.0000 mg | ORAL_TABLET | Freq: Two times a day (BID) | ORAL | Status: DC
  Administered 2021-11-12: 5 mg via ORAL
  Filled 2021-11-12: qty 1

## 2021-11-12 MED ORDER — HEPARIN (PORCINE) IN NACL 2000-0.9 UNIT/L-% IV SOLN
INTRAVENOUS | Status: DC | PRN
  Administered 2021-11-12: 1000 mL

## 2021-11-12 MED ORDER — SUGAMMADEX SODIUM 200 MG/2ML IV SOLN
INTRAVENOUS | Status: DC | PRN
  Administered 2021-11-12: 200 mg via INTRAVENOUS

## 2021-11-12 MED ORDER — LACTATED RINGERS IV SOLN: INTRAVENOUS | Status: DC

## 2021-11-12 MED ORDER — PROPOFOL 10 MG/ML IV BOLUS
INTRAVENOUS | Status: DC | PRN
  Administered 2021-11-12: 160 mg via INTRAVENOUS

## 2021-11-12 MED ORDER — IOHEXOL 350 MG/ML SOLN
INTRAVENOUS | Status: DC | PRN
  Administered 2021-11-12: 5 mL

## 2021-11-12 MED ORDER — ASPIRIN EC 81 MG PO TBEC: 81.0000 mg | DELAYED_RELEASE_TABLET | Freq: Every day | ORAL | 11 refills | Status: DC

## 2021-11-12 MED ORDER — PYRIDOSTIGMINE BROMIDE 60 MG PO TABS
60.0000 mg | ORAL_TABLET | Freq: Four times a day (QID) | ORAL | Status: DC
  Administered 2021-11-12 (×3): 60 mg via ORAL
  Filled 2021-11-12 (×4): qty 1

## 2021-11-12 MED ORDER — CEFAZOLIN SODIUM-DEXTROSE 2-4 GM/100ML-% IV SOLN
2.0000 g | INTRAVENOUS | Status: AC
  Administered 2021-11-12: 2 g via INTRAVENOUS
  Filled 2021-11-12: qty 100

## 2021-11-12 MED ORDER — ONDANSETRON HCL 4 MG/2ML IJ SOLN
INTRAMUSCULAR | Status: DC | PRN
  Administered 2021-11-12: 4 mg via INTRAVENOUS

## 2021-11-12 MED ORDER — HEPARIN (PORCINE) IN NACL 1000-0.9 UT/500ML-% IV SOLN
INTRAVENOUS | Status: AC
  Filled 2021-11-12: qty 500

## 2021-11-12 MED ORDER — ASPIRIN EC 81 MG PO TBEC: 81.0000 mg | DELAYED_RELEASE_TABLET | Freq: Every day | ORAL | Status: DC

## 2021-11-12 MED ORDER — ROSUVASTATIN CALCIUM 5 MG PO TABS
10.0000 mg | ORAL_TABLET | Freq: Every day | ORAL | Status: DC
  Administered 2021-11-12: 10 mg via ORAL
  Filled 2021-11-12: qty 2

## 2021-11-12 MED ORDER — FENTANYL CITRATE (PF) 100 MCG/2ML IJ SOLN
INTRAMUSCULAR | Status: DC | PRN
  Administered 2021-11-12: 100 ug via INTRAVENOUS

## 2021-11-12 MED ORDER — FENTANYL CITRATE (PF) 100 MCG/2ML IJ SOLN
INTRAMUSCULAR | Status: AC
  Filled 2021-11-12: qty 2

## 2021-11-12 MED ORDER — SODIUM CHLORIDE 0.9 % IV SOLN: 250.0000 mL | INTRAVENOUS | Status: DC | PRN

## 2021-11-12 MED ORDER — ROCURONIUM BROMIDE 10 MG/ML (PF) SYRINGE
PREFILLED_SYRINGE | INTRAVENOUS | Status: DC | PRN
  Administered 2021-11-12: 40 mg via INTRAVENOUS

## 2021-11-12 MED ORDER — HEPARIN (PORCINE) IN NACL 1000-0.9 UT/500ML-% IV SOLN
INTRAVENOUS | Status: DC | PRN
  Administered 2021-11-12: 500 mL

## 2021-11-12 MED ORDER — ACETAMINOPHEN 325 MG PO TABS: 650.0000 mg | ORAL_TABLET | ORAL | Status: DC | PRN

## 2021-11-12 MED ORDER — ONDANSETRON HCL 4 MG/2ML IJ SOLN: 4.0000 mg | Freq: Four times a day (QID) | INTRAMUSCULAR | Status: DC | PRN

## 2021-11-12 MED ORDER — LEVOTHYROXINE SODIUM 25 MCG PO TABS: 137.0000 ug | ORAL_TABLET | Freq: Every day | ORAL | Status: DC

## 2021-11-12 MED ORDER — SODIUM CHLORIDE 0.9% FLUSH: 3.0000 mL | INTRAVENOUS | Status: DC | PRN

## 2021-11-12 MED ORDER — LIDOCAINE 2% (20 MG/ML) 5 ML SYRINGE
INTRAMUSCULAR | Status: DC | PRN
  Administered 2021-11-12: 60 mg via INTRAVENOUS

## 2021-11-12 MED ORDER — ASPIRIN EC 81 MG PO TBEC
81.0000 mg | DELAYED_RELEASE_TABLET | Freq: Every day | ORAL | Status: DC
  Administered 2021-11-12: 81 mg via ORAL
  Filled 2021-11-12: qty 1

## 2021-11-12 MED ORDER — PHENYLEPHRINE HCL-NACL 20-0.9 MG/250ML-% IV SOLN
INTRAVENOUS | Status: DC | PRN
  Administered 2021-11-12: 25 ug/min via INTRAVENOUS

## 2021-11-12 SURGICAL SUPPLY — 20 items
BAG SNAP BAND KOVER 36X36 (MISCELLANEOUS) ×1 IMPLANT
CATH DIAG 6FR PIGTAIL ANGLED (CATHETERS) ×1 IMPLANT
CLOSURE PERCLOSE PROSTYLE (VASCULAR PRODUCTS) ×2 IMPLANT
DEVICE WATCHMAN FLX PROC (KITS) IMPLANT
DILATOR VESSEL 38 20CM 11FR (INTRODUCER) ×1 IMPLANT
DILATOR VESSEL 38 20CM 16FR (INTRODUCER) ×1 IMPLANT
KIT HEART LEFT (KITS) ×2 IMPLANT
KIT SHEA VERSACROSS LAAC CONNE (KITS) ×1 IMPLANT
PACK CARDIAC CATHETERIZATION (CUSTOM PROCEDURE TRAY) ×2 IMPLANT
PAD DEFIB RADIO PHYSIO CONN (PAD) ×2 IMPLANT
SHEATH PERFORMER 16FR 30 (SHEATH) ×1 IMPLANT
SHEATH PINNACLE 8F 10CM (SHEATH) ×1 IMPLANT
SHEATH PROBE COVER 6X72 (BAG) ×3 IMPLANT
SYS WATCHMAN FXD DBL (SHEATH) ×2
SYSTEM WATCHMAN FXD DBL (SHEATH) IMPLANT
TRANSDUCER W/STOPCOCK (MISCELLANEOUS) ×2 IMPLANT
TUBING CIL FLEX 10 FLL-RA (TUBING) ×3 IMPLANT
WATCHMAN FLX 24 (Prosthesis & Implant Heart) ×1 IMPLANT
WATCHMAN FLX PROCEDURE DEVICE (KITS) ×2 IMPLANT
WATCHMAN PROCED TRUSEAL ACCESS (SHEATH) ×1 IMPLANT

## 2021-11-12 NOTE — Anesthesia Procedure Notes (Signed)
Procedure Name: Intubation Date/Time: 11/12/2021 7:47 AM Performed by: Adria Dill, CRNA Pre-anesthesia Checklist: Patient identified, Emergency Drugs available, Suction available and Patient being monitored Patient Re-evaluated:Patient Re-evaluated prior to induction Oxygen Delivery Method: Circle system utilized Preoxygenation: Pre-oxygenation with 100% oxygen Induction Type: IV induction Ventilation: Mask ventilation without difficulty and Oral airway inserted - appropriate to patient size Laryngoscope Size: Robertshaw and 4 Grade View: Grade I Tube type: Oral Tube size: 7.5 mm Number of attempts: 1 Airway Equipment and Method: Stylet Placement Confirmation: ETT inserted through vocal cords under direct vision, positive ETCO2 and breath sounds checked- equal and bilateral Secured at: 22 cm Tube secured with: Tape Dental Injury: Teeth and Oropharynx as per pre-operative assessment

## 2021-11-12 NOTE — Progress Notes (Signed)
Admission from the cath lab by bed awake and alert. Instructed not to bend right knee and bedrest till 3 pm. Hob on 30 degree till bedrest is lifted. To call for any sign of bleeding. Continue to monitor.

## 2021-11-12 NOTE — Anesthesia Preprocedure Evaluation (Signed)
Anesthesia Evaluation  Patient identified by MRN, date of birth, ID band Patient awake    Reviewed: Allergy & Precautions, NPO status , Patient's Chart, lab work & pertinent test results  Airway Mallampati: III  TM Distance: >3 FB Neck ROM: Full    Dental no notable dental hx.    Pulmonary asthma , former smoker,    Pulmonary exam normal        Cardiovascular + angina + CAD and + CABG (11/21)  + dysrhythmias Atrial Fibrillation  Rhythm:Irregular Rate:Normal     Neuro/Psych Depression Myasthenia gravis on deltasone 10mg . Ocular symptoms, well controlled    GI/Hepatic Neg liver ROS, GERD  Medicated,  Endo/Other  diabetes, Well Controlled, Oral Hypoglycemic AgentsHypothyroidism   Renal/GU negative Renal ROS  negative genitourinary   Musculoskeletal  (+) Arthritis , Osteoarthritis,    Abdominal Normal abdominal exam  (+)   Peds  Hematology negative hematology ROS (+)   Anesthesia Other Findings   Reproductive/Obstetrics                             Anesthesia Physical Anesthesia Plan  ASA: 3  Anesthesia Plan: General   Post-op Pain Management:    Induction: Intravenous  PONV Risk Score and Plan: 2 and Ondansetron, Dexamethasone and Treatment may vary due to age or medical condition  Airway Management Planned: Mask and Oral ETT  Additional Equipment: None  Intra-op Plan:   Post-operative Plan: Extubation in OR  Informed Consent: I have reviewed the patients History and Physical, chart, labs and discussed the procedure including the risks, benefits and alternatives for the proposed anesthesia with the patient or authorized representative who has indicated his/her understanding and acceptance.     Dental advisory given  Plan Discussed with: CRNA  Anesthesia Plan Comments: (CLEARSIGHT  Lab Results      Component                Value               Date                      WBC                       5.8                 11/05/2021                HGB                      14.2                11/05/2021                HCT                      42.5                11/05/2021                MCV                      90                  11/05/2021                PLT  152                 11/05/2021           Lab Results      Component                Value               Date                      NA                       139                 11/05/2021                K                        4.2                 11/05/2021                CO2                      29                  11/05/2021                GLUCOSE                  214 (H)             11/05/2021                BUN                      19                  11/05/2021                CREATININE               0.99                11/05/2021                CALCIUM                  9.1                 11/05/2021                EGFR                     84                  11/05/2021                GFRNONAA                 65                  01/02/2021           ECHO 02/22: 1. Systemic anticoagulation is recommended.  2. There seems to be an old formed thrombus attached to the pectinate  muscles in the left atrial appendage.  3. Left ventricular ejection fraction, by estimation, is 55 to 60%.  The  left ventricle has normal function. The left ventricle has no regional  wall motion abnormalities. There is mild concentric left ventricular  hypertrophy. Left ventricular diastolic  function could not be evaluated.  4. Right ventricular systolic function is normal. The right ventricular  size is normal.  5. Left atrial size was mildly dilated. A left atrial/left atrial  appendage thrombus was detected. The LAA emptying velocity was 80 cm/s.  6. The mitral valve is normal in structure. Mild mitral valve  regurgitation. No evidence of mitral stenosis.  7. The aortic valve is normal in structure.  Aortic valve regurgitation is  not visualized. No aortic stenosis is present.  8. There is mild (Grade II) plaque.  9. The inferior vena cava is normal in size with greater than 50%  respiratory variability, suggesting right atrial pressure of 3 mmHg)        Anesthesia Quick Evaluation

## 2021-11-12 NOTE — Progress Notes (Signed)
° °  Patient seen after transfer to [facility] after successful implantation of a 24 mm Watchman FLX left atrial appendage occlusion device using fluoroscopic and transesophageal echo guidance. He is doing great with no new complaints. He remains a little sleepy from anesthesia. Spoke to wife and patient about the possibility of same day discharge today. Groin site is stable with no bleeding or hematoma. He will restart Eliquis 5mg  PO BID today at 1300 along with ASA. Plan to continue this regimen until after 45 day re-look at device to ensure adequate seal. At that time, we will make appropriate medication changes.    NP-C Structural Heart Team  Pager: [phone]

## 2021-11-12 NOTE — Transfer of Care (Signed)
Immediate Anesthesia Transfer of Care Note  Patient: Derek Moore  Procedure(s) Performed: LEFT ATRIAL APPENDAGE OCCLUSION TRANSESOPHAGEAL ECHOCARDIOGRAM (TEE)  Patient Location: PACU and Cath Lab  Anesthesia Type:General  Level of Consciousness: awake and patient cooperative  Airway & Oxygen Therapy: Patient Spontanous Breathing and Patient connected to face mask oxygen  Post-op Assessment: Report given to RN and Post -op Vital signs reviewed and stable  Post vital signs: Reviewed and stable  Last Vitals:  Vitals Value Taken Time  BP 95/50 11/12/21 0909  Temp 36.7 C 11/12/21 0908  Pulse 71 11/12/21 0910  Resp 15 11/12/21 0910  SpO2 96 % 11/12/21 0910  Vitals shown include unvalidated device data.  Last Pain:  Vitals:   11/12/21 0908  TempSrc: Oral  PainSc: 0-No pain      Patients Stated Pain Goal: 0 (11/12/21 3832)  Complications: No notable events documented.

## 2021-11-12 NOTE — Progress Notes (Signed)
Discharged home accompanied by wife . Discharge instructions given to wife kim and pt. Belongings taken home.

## 2021-11-12 NOTE — Discharge Summary (Signed)
[facility]    Patient ID: Derek Moore,  MRN: [id number], DOB/AGE: Apr 24, 1956 65 y.o.  Admit date: 11/12/2021 Discharge date: 11/12/2021  Primary Care Physician: Raina Mina., MD  Primary Cardiologist: Berniece Salines, DO  Electrophysiologist: Vickie Epley, MD  Primary Discharge Diagnosis:  Paroxysmal atrial fibrillation felt to be a poor candidate for long term anticoagulation due to iron deficiency anemia and significant bruising on anticoagulation.   Secondary Discharge Diagnosis:  CAD s/p CABG, hx of left atrial appendage thrombus which is now resolved, HLD, DM2, and HTN.   Procedures This Admission:  Transeptal Puncture Intra-procedural TEE which showed no LAA thrombus Left atrial appendage occlusive device placement on 11/12/21 by Dr. Quentin Ore.   Conclusion: Successful implantation of a 24 mm Watchman FLX left atrial appendage occlusion device using fluoroscopic and transesophageal echo guidance  Brief HPI: Derek Moore is a 65 y.o. male with a history of CAD s/p CABG x4 09/2020, postoperative (paroxysmal) atrial fibrillation, left atrial appendage thrombus on chronic anticoagulation with Eliquis 5 mg, HLD, DM2, and HTN.   The patient underwent CABG and unfortunately had issues with post operative atrial fibrillation, found to have LA thrombus. He was placed on chronic anticoagulation with Eliquis 5mg  BID. He wore a ZIO monitor 12/08/2020 that showed low AF burden. He was eventually referred to Dr. Quentin Ore for the evaluation of Watchman implant due to iron deficiency anemia and significant bruising while on oral anticoagulation. He was hoping to explore options for stroke risk mitigation that would allow him to avoid long-term exposure to anticoagulation. Plan at that time was to obtain pre Watchman CT scans for further evaluation of his anatomy.  CT performed 11/02/21 showed a left atrial appendage which was a large chicken wing morphology without  thrombus suitable for a 27 mm watchman FLX device with 24% compression with an inferior posterior IAS puncture site is recommended at RAO 15 CRA 12 for optimal deployment.   He then presented to [facility] 11/12/21 for planned Watchman implant with Dr. Quentin Ore.   Hospital Course:  The patient presented to the hospital and underwent left atrial appendage occlusive device placement with 56mm Watchman FLX using fluoroscopic and transesophageal echo guidance. He was monitored on telemetry after the procedure which demonstrated NSR with rates in the 60-70's. Groin site remained stable without complication including signs of hematoma or bleeding. He has ambulated without issue. He has progressed very well after his procedure and has been identified as a candidate for same day procedure discharge today 11/12/21. Wound care and restrictions were reviewed with the patient. The patient has been scheduled for post procedure follow up with Kathyrn Drown, NP in approximately one month. Medication plan will be to resume PTA Eliquis 5mg  PO BID along with ASA 81mg  PO QD. A repeat TEE at approximately 45 days will be performed to ensure proper seal of the device. The patient will then follow with Dr. Quentin Ore in 12 weeks for post closure follow up.   Physical Exam: Vitals:   11/12/21 1045 11/12/21 1100 11/12/21 1130 11/12/21 1400  BP: 99/60 (!) 98/56 99/63 (!) 97/59  Pulse: 65 65 66 68  Resp: 14 16 20 18   Temp:  98.4 F (36.9 C)    TempSrc:  Oral    SpO2: 95% 92% 92% 92%  Weight:      Height:       General: Well developed, well nourished, NAD Neck: Negative for carotid bruits. No JVD Lungs:Clear to ausculation bilaterally.  Cardiovascular: RRR with S1 S2. No murmurs Extremities: No edema. Neuro: Alert and oriented. No focal deficits. No facial asymmetry. MAE spontaneously. Psych: Responds to questions appropriately with normal affect.    Labs:   Lab Results  Component Value Date   WBC 5.8 11/05/2021   HGB  14.2 11/05/2021   HCT 42.5 11/05/2021   MCV 90 11/05/2021   PLT 152 11/05/2021    No results for input(s): NA, K, CL, CO2, BUN, CREATININE, CALCIUM, PROT, BILITOT, ALKPHOS, ALT, AST, GLUCOSE in the last 168 hours.  Invalid input(s): LABALBU   Discharge Medications:  Allergies as of 11/12/2021       Reactions   Codeine Itching   Hydrocodone-acetaminophen Hives, Itching   Lipitor [atorvastatin] Other (See Comments)   Aches and pains   Latex Itching        Medication List     TAKE these medications    acetaminophen 500 MG tablet Commonly known as: TYLENOL Take 500-1,000 mg by mouth every 6 (six) hours as needed (for pain.).   albuterol 108 (90 Base) MCG/ACT inhaler Commonly known as: VENTOLIN HFA Inhale into the lungs every 6 (six) hours as needed for wheezing or shortness of breath.   albuterol (2.5 MG/3ML) 0.083% nebulizer solution Commonly known as: PROVENTIL Take 2.5 mg by nebulization every 6 (six) hours as needed for wheezing or shortness of breath.   apixaban 5 MG Tabs tablet Commonly known as: Eliquis Take 1 tablet (5 mg total) by mouth 2 (two) times daily. Notes to patient: You may take your evening dose of Eliquis tonight, 11/12/21   Artificial Tears PF 0.1-0.3 % Soln Generic drug: Dextran 70-Hypromellose (PF) Place 1 drop into both eyes 3 (three) times daily as needed (dry/irritated eyes.).   aspirin EC 81 MG tablet Take 1 tablet (81 mg total) by mouth daily. Notes to patient: Start 11/13/21   ferrous sulfate 325 (65 FE) MG EC tablet Take 325 mg by mouth 3 (three) times a week.   Fish Oil Ultra 1400 MG Caps Take 1,400 mg by mouth in the morning and at bedtime.   Jardiance 10 MG Tabs tablet Generic drug: empagliflozin Take 10 mg by mouth daily.   levothyroxine 137 MCG tablet Commonly known as: SYNTHROID Take 137 mcg by mouth daily before breakfast.   metFORMIN 1000 MG tablet Commonly known as: GLUCOPHAGE Take 1,000 mg by mouth 2 (two) times  daily with a meal.   Ozempic (0.25 or 0.5 MG/DOSE) 2 MG/1.5ML Sopn Generic drug: Semaglutide(0.25 or 0.5MG /DOS) Inject 0.5 mg into the skin every Wednesday.   pantoprazole 40 MG tablet Commonly known as: PROTONIX Take 40 mg by mouth daily.   PARoxetine 40 MG tablet Commonly known as: PAXIL Take 40 mg by mouth daily.   predniSONE 10 MG tablet Commonly known as: DELTASONE Take 10 mg by mouth daily.   pyridostigmine 60 MG tablet Commonly known as: MESTINON Take 60 mg by mouth 4 (four) times daily.   rosuvastatin 10 MG tablet Commonly known as: CRESTOR TAKE 1 TABLET BY MOUTH  DAILY   vitamin B-12 1000 MCG tablet Commonly known as: CYANOCOBALAMIN Take 1,000 mcg by mouth daily.        Disposition:  Home  Discharge Instructions     Call MD for:  difficulty breathing, headache or visual disturbances   Complete by: As directed    Call MD for:  difficulty breathing, headache or visual disturbances   Complete by: As directed    Call MD for:  extreme  fatigue   Complete by: As directed    Call MD for:  extreme fatigue   Complete by: As directed    Call MD for:  hives   Complete by: As directed    Call MD for:  hives   Complete by: As directed    Call MD for:  persistant dizziness or light-headedness   Complete by: As directed    Call MD for:  persistant dizziness or light-headedness   Complete by: As directed    Call MD for:  persistant nausea and vomiting   Complete by: As directed    Call MD for:  persistant nausea and vomiting   Complete by: As directed    Call MD for:  redness, tenderness, or signs of infection (pain, swelling, redness, odor or green/yellow discharge around incision site)   Complete by: As directed    Call MD for:  redness, tenderness, or signs of infection (pain, swelling, redness, odor or green/yellow discharge around incision site)   Complete by: As directed    Call MD for:  severe uncontrolled pain   Complete by: As directed    Call MD for:   severe uncontrolled pain   Complete by: As directed    Call MD for:  temperature >100.4   Complete by: As directed    Call MD for:  temperature >100.4   Complete by: As directed    Diet - low sodium heart healthy   Complete by: As directed    Diet - low sodium heart healthy   Complete by: As directed    Discharge instructions   Complete by: As directed    WATCHMAN Procedure, Care After  Procedure MD: Dr. Benson Norway Clinical Coordinator: Lenice Llamas, RN  This sheet gives you information about how to care for yourself after your procedure. Your health care provider may also give you more specific instructions. If you have problems or questions, contact your health care provider.  What can I expect after the procedure? After the procedure, it is common to have: Bruising around your puncture site. Tenderness around your puncture site. Tiredness (fatigue).  Medication instructions It is very important to continue to take your blood thinner as directed by your doctor after the Watchman procedure. Call your procedure doctor's office with question or concerns. If you are on Coumadin (warfarin), you will have your INR checked the week after your procedure, with a goal INR of 2.0 - 3.0. Please follow your medication instructions on your discharge summary. Only take the medications listed on your discharge paperwork.  Follow up You will be seen in 1 month after your procedure in the [facility] [place] will have another TEE (Transesophageal Echocardiogram) approximately 6 weeks after your procedure mark to check your device You will follow up the MD/APP who performed your procedure 6 months after your procedure The Watchman Clinical Coordinator will check in with you from time to time, including 1 and 2 years after your procedure.    Follow these instructions at home: Puncture site care  Follow instructions from your health care provider about how to take care of your  puncture site. Make sure you: If present, leave stitches (sutures), skin glue, or adhesive strips in place.  If a large square bandage is present, this may be removed 24 hours after surgery.  Check your puncture site every day for signs of infection. Check for: Redness, swelling, or pain. Fluid or blood. If your puncture site starts to bleed, lie down on  your back, apply firm pressure to the area, and contact your health care provider. Warmth. Pus or a bad smell. Driving Do not drive yourself home if you received sedation Do not drive for at least 4 days after your procedure or however long your health care provider recommends. (Do not resume driving if you have previously been instructed not to drive for other health reasons.) Do not spend greater than 1 hour at a time in a car for the first 3 days. Stop and take a break with a 5 minute walk at least every hour.  Do not drive or use heavy machinery while taking prescription pain medicine.  Activity Avoid activities that take a lot of effort, including exercise, for at least 7 days after your procedure. For the first 3 days, avoid sitting for longer than one hour at a time.  Avoid alcoholic beverages, signing paperwork, or participating in legal proceedings for 24 hours after receiving sedation Do not lift anything that is heavier than 10 lb (4.5 kg) for one week.  No sexual activity for 1 week.  Return to your normal activities as told by your health care provider. Ask your health care provider what activities are safe for you. General instructions Take over-the-counter and prescription medicines only as told by your health care provider. Do not use any products that contain nicotine or tobacco, such as cigarettes and e-cigarettes. If you need help quitting, ask your health care provider. You may shower after 24 hours, but Do not take baths, swim, or use a hot tub for 1 week.  Do not drink alcohol for 24 hours after your procedure. Keep  all follow-up visits as told by your health care provider. This is important. Dental Work: You will require antibiotics prior to any dental work, including cleanings, for 6 months after your Watchman implantation to help protect you from infection. After 6 months, antibiotics are no longer required. Contact a health care provider if: You have redness, mild swelling, or pain around your puncture site. You have soreness in your throat or at your puncture site that does not improve after several days You have fluid or blood coming from your puncture site that stops after applying firm pressure to the area. Your puncture site feels warm to the touch. You have pus or a bad smell coming from your puncture site. You have a fever. You have chest pain or discomfort that spreads to your neck, jaw, or arm. You are sweating a lot. You feel nauseous. You have a fast or irregular heartbeat. You have shortness of breath. You are dizzy or light-headed and feel the need to lie down. You have pain or numbness in the arm or leg closest to your puncture site. Get help right away if: Your puncture site suddenly swells. Your puncture site is bleeding and the bleeding does not stop after applying firm pressure to the area. These symptoms may represent a serious problem that is an emergency. Do not wait to see if the symptoms will go away. Get medical help right away. Call your local emergency services (911 in the [place] Do not drive yourself to the hospital. Summary After the procedure, it is normal to have bruising and tenderness at the puncture site in your groin, neck, or forearm. Check your puncture site every day for signs of infection. Get help right away if your puncture site is bleeding and the bleeding does not stop after applying firm pressure to the area. This is a medical emergency.  This information is not intended to replace advice given to you by your health care provider. Make sure you discuss any  questions you have with your health care provider.   Discharge instructions   Complete by: As directed    WATCHMAN Procedure, Care After  Procedure MD: Dr. Benson Norway Clinical Coordinator: Lenice Llamas, RN  This sheet gives you information about how to care for yourself after your procedure. Your health care provider may also give you more specific instructions. If you have problems or questions, contact your health care provider.  What can I expect after the procedure? After the procedure, it is common to have: Bruising around your puncture site. Tenderness around your puncture site. Tiredness (fatigue).  Medication instructions It is very important to continue to take your blood thinner as directed by your doctor after the Watchman procedure. Call your procedure doctor's office with question or concerns. If you are on Coumadin (warfarin), you will have your INR checked the week after your procedure, with a goal INR of 2.0 - 3.0. Please follow your medication instructions on your discharge summary. Only take the medications listed on your discharge paperwork.  Follow up You will be seen in 1 month after your procedure in the [facility] [place] will have another TEE (Transesophageal Echocardiogram) approximately 6 weeks after your procedure mark to check your device You will follow up the MD/APP who performed your procedure 6 months after your procedure The Watchman Clinical Coordinator will check in with you from time to time, including 1 and 2 years after your procedure.    Follow these instructions at home: Puncture site care  Follow instructions from your health care provider about how to take care of your puncture site. Make sure you: If present, leave stitches (sutures), skin glue, or adhesive strips in place.  If a large square bandage is present, this may be removed 24 hours after surgery.  Check your puncture site every day for signs of infection. Check  for: Redness, swelling, or pain. Fluid or blood. If your puncture site starts to bleed, lie down on your back, apply firm pressure to the area, and contact your health care provider. Warmth. Pus or a bad smell. Driving Do not drive yourself home if you received sedation Do not drive for at least 4 days after your procedure or however long your health care provider recommends. (Do not resume driving if you have previously been instructed not to drive for other health reasons.) Do not spend greater than 1 hour at a time in a car for the first 3 days. Stop and take a break with a 5 minute walk at least every hour.  Do not drive or use heavy machinery while taking prescription pain medicine.  Activity Avoid activities that take a lot of effort, including exercise, for at least 7 days after your procedure. For the first 3 days, avoid sitting for longer than one hour at a time.  Avoid alcoholic beverages, signing paperwork, or participating in legal proceedings for 24 hours after receiving sedation Do not lift anything that is heavier than 10 lb (4.5 kg) for one week.  No sexual activity for 1 week.  Return to your normal activities as told by your health care provider. Ask your health care provider what activities are safe for you. General instructions Take over-the-counter and prescription medicines only as told by your health care provider. Do not use any products that contain nicotine or tobacco, such as cigarettes and e-cigarettes. If  you need help quitting, ask your health care provider. You may shower after 24 hours, but Do not take baths, swim, or use a hot tub for 1 week.  Do not drink alcohol for 24 hours after your procedure. Keep all follow-up visits as told by your health care provider. This is important. Dental Work: You will require antibiotics prior to any dental work, including cleanings, for 6 months after your Watchman implantation to help protect you from infection. After 6  months, antibiotics are no longer required. Contact a health care provider if: You have redness, mild swelling, or pain around your puncture site. You have soreness in your throat or at your puncture site that does not improve after several days You have fluid or blood coming from your puncture site that stops after applying firm pressure to the area. Your puncture site feels warm to the touch. You have pus or a bad smell coming from your puncture site. You have a fever. You have chest pain or discomfort that spreads to your neck, jaw, or arm. You are sweating a lot. You feel nauseous. You have a fast or irregular heartbeat. You have shortness of breath. You are dizzy or light-headed and feel the need to lie down. You have pain or numbness in the arm or leg closest to your puncture site. Get help right away if: Your puncture site suddenly swells. Your puncture site is bleeding and the bleeding does not stop after applying firm pressure to the area. These symptoms may represent a serious problem that is an emergency. Do not wait to see if the symptoms will go away. Get medical help right away. Call your local emergency services (911 in the [place] Do not drive yourself to the hospital. Summary After the procedure, it is normal to have bruising and tenderness at the puncture site in your groin, neck, or forearm. Check your puncture site every day for signs of infection. Get help right away if your puncture site is bleeding and the bleeding does not stop after applying firm pressure to the area. This is a medical emergency.  This information is not intended to replace advice given to you by your health care provider. Make sure you discuss any questions you have with your health care provider.   Increase activity slowly   Complete by: As directed    Increase activity slowly   Complete by: As directed        Follow-up Information     Tommie Raymond, NP Follow up on 12/14/2021.    Specialty: Cardiology Why: at 1:30pm. Please plan to arrive to your appointment at 1:15pm. Contact information: [place] [address] [phone]                 Duration of Discharge Encounter: Greater than 30 minutes including physician time.  Signed, Kathyrn Drown, NP  11/12/2021 4:33 PM

## 2021-11-12 NOTE — Interval H&P Note (Signed)
History and Physical Interval Note:  11/12/2021 7:16 AM  Derek Moore  has presented today for surgery, with the diagnosis of afib.  The various methods of treatment have been discussed with the patient and family. After consideration of risks, benefits and other options for treatment, the patient has consented to  Procedure(s): LEFT ATRIAL APPENDAGE OCCLUSION (N/A) TRANSESOPHAGEAL ECHOCARDIOGRAM (TEE) (N/A) as a surgical intervention.  The patient's history has been reviewed, patient examined, no change in status, stable for surgery.  I have reviewed the patient's chart and labs.  Questions were answered to the patient's satisfaction.     Kelby Adell T [name]

## 2021-11-12 NOTE — Progress Notes (Signed)
Right groin with small bruise and soft to touch. Denied any pain. No bleeding noted. All set for discharge home wife at bedside.

## 2021-11-12 NOTE — Anesthesia Postprocedure Evaluation (Signed)
Anesthesia Post Note  Patient: Derek Moore  Procedure(s) Performed: LEFT ATRIAL APPENDAGE OCCLUSION TRANSESOPHAGEAL ECHOCARDIOGRAM (TEE)     Patient location during evaluation: PACU Anesthesia Type: General Level of consciousness: awake and alert Pain management: pain level controlled Vital Signs Assessment: post-procedure vital signs reviewed and stable Respiratory status: spontaneous breathing, nonlabored ventilation, respiratory function stable and patient connected to nasal cannula oxygen Cardiovascular status: blood pressure returned to baseline and stable Postop Assessment: no apparent nausea or vomiting Anesthetic complications: no   No notable events documented.  Last Vitals:  Vitals:   11/12/21 1100 11/12/21 1130  BP: (!) 98/56 99/63  Pulse: 65 66  Resp: 16 20  Temp: 36.9 C   SpO2: 92% 92%    Last Pain:  Vitals:   11/12/21 1200  TempSrc:   PainSc: 0-No pain                 Earl Lites P Niveah Boerner

## 2021-11-12 NOTE — Op Note (Signed)
°  [facility]   MULTIDISCIPLINARY HEART TEAM  Watchman LAAO Procedure   Surgeon: Steffanie Dunn, MD Co-Surgeon: Tonny Bollman, MD Anesthesia: Gavin Potters, DO Imager: Thurmon Fair, MD   Procedure: 1) Transseptal Puncture 2) Watchman left atrial appendage occlusion 3) Perclose right femoral vein   Device Implant: 24 mm Watchman Flex Device, Lot # [id number]   Background and Indication: 65 yo with paroxysmal atrial fibrillation, iron deficiency anemia, felt to be a poor candidate for long term anticoagulation. He is referred for the above procedure   Procedure description: US guidance is used for right femoral venous access. [place] images are stored digitally in the patient's chart. Double Preclose is performed at 10" and 2" positions using normal technique and a after progressively dilating the femoral vein over an Amplatz Superstiff wire, a 16 Fr sheath is inserted. Transseptal puncture is then performed after fully anticoagulating the patient with unfractionated heparin. A therapeutic ACT greater than 250 seconds is achieved. A Versacross system is used with RF energy to cross the interatrial septum under fluoroscopic and TEE guidance. Please see Dr Lovena Neighbours detailed report for further description of transseptal puncture and Watchman Access Sheath insertion.   Once the 14 Fr access sheath is in appropriate position in the left atrial appendage, a 24 mm Watchman Flex device is inserted and deployed, demonstrating appropriate position and compression in the left atrial appendage. Thorough TEE imaging is performed to evaluate all PASS criteria prior to device release. After device release, the device remains in stable position with complete occlusion of the appendage. The sheath is removed from the body and the previously deployed perclose sutures are tightened for complete hemostasis.   Conclusion: Successful implantation of a 24 mm Watchman FLX left atrial appendage occlusion  device using fluoroscopic and transesophageal echo guidance  Tonny Bollman 11/12/2021 9:27 AM

## 2021-11-13 ENCOUNTER — Telehealth: Payer: Self-pay | Admitting: Cardiology

## 2021-11-13 NOTE — Telephone Encounter (Signed)
° °  Called the patient for TOC follow up. He states that has been doing well after hospital discharge yesterday afternoon with no groin complication. He feels great. Reviewed post op care instructions. All questions answered. Plan for OP follow up in 11/2021.   Georgie Chard NP-C Structural Heart Team  Pager: [phone]

## 2021-11-16 ENCOUNTER — Other Ambulatory Visit: Payer: Self-pay | Admitting: Cardiology

## 2021-11-17 NOTE — Telephone Encounter (Signed)
Prescription refill request for Eliquis received. Indication: afib  Last office visit: Lalla Brothers 11/05/2021 Scr: 0.99, 11/05/2021 Age: 65 yo  Weight: 86.2 kg   Pt is on the correct dose of Eliquis. Refill sent.

## 2021-11-25 ENCOUNTER — Ambulatory Visit: Payer: Commercial Managed Care - PPO | Admitting: Cardiology

## 2021-12-08 NOTE — H&P (View-Only) (Signed)
[facility]                                     Cardiology Office Note:    Date:  12/14/2021   ID:  Derek Moore, DOB 1956-04-10, MRN [id number]  PCP:  Raina Mina., MD  [facility] Cardiologist:  Berniece Salines, DO  [place] [facility] Electrophysiologist:  Vickie Epley, MD   Referring MD: Raina Mina., MD   Chief Complaint  Patient presents with   Follow-up    Pre TEE   History of Present Illness:    Derek Moore is a 66 y.o. male with a hx of CAD s/p CABG x4 09/2020, postoperative (paroxysmal) atrial fibrillation, left atrial appendage thrombus on chronic anticoagulation with Eliquis 5 mg, HLD, DM2, and HTN.    The patient underwent CABG and unfortunately had issues with post operative atrial fibrillation, found to have LA thrombus. He was placed on chronic anticoagulation with Eliquis 5mg  BID. He wore a ZIO monitor 12/08/2020 that showed low AF burden. He was eventually referred to Dr. Quentin Moore for the evaluation of Watchman implant due to iron deficiency anemia and significant bruising while on oral anticoagulation. He was hoping to explore options for stroke risk mitigation that would allow him to avoid long-term exposure to anticoagulation. Plan at that time was to obtain pre Watchman CT scans for further evaluation of his anatomy. CT performed 11/02/21 showed a left atrial appendage which was a large chicken wing morphology without thrombus suitable for a 27 mm watchman FLX device with 24% compression with an inferior posterior IAS puncture site is recommended at RAO 15 CRA 12 for optimal deployment.    He then presented to [facility] 11/12/21 for planned Watchman implant with Dr. Quentin Moore with 50mm Watchman FLX using fluoroscopic and transesophageal echo guidance. He progressed very well after his procedure and was as a candidate for same day procedure discharge. Medication plan was to resume PTA Eliquis 5mg  PO BID along with ASA 81mg  PO QD. A repeat TEE at  approximately 45 days will be performed to ensure proper seal of the device.   Today [name] is doing very well. He is tolerating antiplatelet therapy with no issues. He denies recent palpitations, SOB, chest pain, LE edema, dizziness, or syncope.   Past Medical History:  Diagnosis Date   Abnormal EKG 03/09/2018   Acquired hypothyroidism 03/07/2018   Allergy    Aortic atherosclerosis ([place]) 07/17/2020   Formatting of this note might be different from the original. Seen on CT [facility] 06/2019   Arthritis    Asthma    CAD (coronary artery disease), native coronary artery    Cataract    Chronic gout without tophus 03/07/2018   Coronary artery calcification seen on CAT scan 07/17/2020   Formatting of this note might be different from the original. 06/2019. [facility]   Cough syncope 1999   Dental caries    Depression 03/07/2018   Diplopia 10/11/2018   DISH (diffuse idiopathic skeletal hyperostosis) 07/17/2020   Formatting of this note might be different from the original. Seen on CT [facility] 06/2019   Dizzy 05/09/2018   Gastroesophageal reflux disease without esophagitis 03/07/2018   Formatting of this note might be different from the original. Long term   GERD (gastroesophageal reflux disease) 03/07/2018   Gout 03/07/2018   Hay fever    Heel spur, left 02/07/2020   Hyperlipidemia  03/07/2018   Hypothyroidism (acquired) 03/07/2018   Formatting of this note might be different from the original. 1990s.   Mixed hyperlipidemia 03/07/2018   Obesity    Ocular myasthenia gravis ([place]) 07/17/2020   Formatting of this note might be different from the original. Ocular.  Follows at [facility].  Sees neurology and eye doctor.   Palpitation 03/08/2018   Plantar fasciitis 02/07/2020   Pneumonia    Presence of Watchman left atrial appendage closure device 11/12/2021   with 54mm Watchman FLX woth Dr. Quentin Moore   Primary osteoarthritis involving multiple joints 07/17/2020   Recurrent sinusitis    Smokeless tobacco  use 07/17/2020   Formatting of this note might be different from the original. Discussed chantix.  He wants to use. Did well in past. Off label, he understands.   Thrombus of left atrial appendage    Tightness of heel cord, left 02/07/2020   Type 2 diabetes mellitus ([place]) 03/07/2018   Type 2 diabetes mellitus without complication, without long-term current use of insulin ([place]) 03/07/2018   Formatting of this note might be different from the original. 2014.    Past Surgical History:  Procedure Laterality Date   CATARACT EXTRACTION W/ INTRAOCULAR LENS  IMPLANT, BILATERAL     COLONOSCOPY W/ POLYPECTOMY  11/27/2012   Colonic polyp, status post polypectomy. Mild sigmoid diverticulosis.   CORONARY ARTERY BYPASS GRAFT N/A 10/27/2020   Procedure: CORONARY ARTERY BYPASS GRAFTING (CABG), ON PUMP, TIMES FOUR, USING LEFT INTERNAL MAMMARY ARTERY AND ENDOSCOPICALLY HARVESTED RIGHT GREATER SAPHENOUS VEIN;  Surgeon: Gaye Pollack, MD;  Location: [place];  Service: Open Heart Surgery;  Laterality: N/A;   detatched retina     ESOPHAGOGASTRODUODENOSCOPY  04/17/2007   Small hiatal hernia. Mild gastritis. Otherwise normal EGD   kidney stone extraction     cystoscopy x 2   KNEE SURGERY Left    bone spurs   LEFT ATRIAL APPENDAGE OCCLUSION N/A 11/12/2021   Procedure: LEFT ATRIAL APPENDAGE OCCLUSION;  Surgeon: Vickie Epley, MD;  Location: [place] [facility];  Service: Cardiovascular;  Laterality: N/A;   LEFT HEART CATH AND CORONARY ANGIOGRAPHY N/A 10/08/2020   Procedure: LEFT HEART CATH AND CORONARY ANGIOGRAPHY;  Surgeon: Leonie Man, MD;  Location: [place] [facility];  Service: Cardiovascular;  Laterality: N/A;   NOSE SURGERY     TEE WITHOUT CARDIOVERSION N/A 10/27/2020   Procedure: TRANSESOPHAGEAL ECHOCARDIOGRAM (TEE);  Surgeon: Gaye Pollack, MD;  Location: [place];  Service: Open Heart Surgery;  Laterality: N/A;   TEE WITHOUT CARDIOVERSION N/A 01/08/2021   Procedure: TRANSESOPHAGEAL  ECHOCARDIOGRAM (TEE);  Surgeon: Dorothy Spark, MD;  Location: [facility] ENDOSCOPY;  Service: Cardiovascular;  Laterality: N/A;   TEE WITHOUT CARDIOVERSION N/A 11/12/2021   Procedure: TRANSESOPHAGEAL ECHOCARDIOGRAM (TEE);  Surgeon: Vickie Epley, MD;  Location: [place] [facility];  Service: Cardiovascular;  Laterality: N/A;    Current Medications: Current Meds  Medication Sig   acetaminophen (TYLENOL) 500 MG tablet Take 500-1,000 mg by mouth every 6 (six) hours as needed (for pain.).   albuterol (PROVENTIL) (2.5 MG/3ML) 0.083% nebulizer solution Take 2.5 mg by nebulization every 6 (six) hours as needed for wheezing or shortness of breath.   albuterol (VENTOLIN HFA) 108 (90 Base) MCG/ACT inhaler Inhale into the lungs every 6 (six) hours as needed for wheezing or shortness of breath.   apixaban (ELIQUIS) 5 MG TABS tablet TAKE 1 TABLET BY MOUTH  TWICE DAILY   aspirin EC 81 MG tablet Take 1 tablet (81  mg total) by mouth daily.   Dextran 70-Hypromellose, PF, (ARTIFICIAL TEARS PF) 0.1-0.3 % SOLN Place 1 drop into both eyes 3 (three) times daily as needed (dry/irritated eyes.).   doxycycline (VIBRAMYCIN) 100 MG capsule Take by mouth.   ferrous sulfate 325 (65 FE) MG EC tablet Take 325 mg by mouth 3 (three) times a week.   JARDIANCE 10 MG TABS tablet Take 10 mg by mouth daily.   levothyroxine (SYNTHROID) 137 MCG tablet Take 137 mcg by mouth daily before breakfast.   Magnesium Oxide 250 MG TABS Take by mouth.   metFORMIN (GLUCOPHAGE) 1000 MG tablet Take 1,000 mg by mouth 2 (two) times daily with a meal.   mycophenolate (CELLCEPT) 500 MG tablet Take by mouth.   Omega-3 Fatty Acids (FISH OIL ULTRA) 1400 MG CAPS Take 1,400 mg by mouth in the morning and at bedtime.   pantoprazole (PROTONIX) 40 MG tablet Take 40 mg by mouth daily.   PARoxetine (PAXIL) 40 MG tablet Take 40 mg by mouth daily.   pyridostigmine (MESTINON) 60 MG tablet Take 60 mg by mouth 4 (four) times daily.   rosuvastatin (CRESTOR) 10 MG  tablet TAKE 1 TABLET BY MOUTH  DAILY   Semaglutide,0.25 or 0.5MG /DOS, (OZEMPIC, 0.25 OR 0.5 MG/DOSE,) 2 MG/1.5ML SOPN Inject 0.5 mg into the skin every Wednesday.    vitamin B-12 (CYANOCOBALAMIN) 1000 MCG tablet Take 1,000 mcg by mouth daily.     Allergies:   Codeine, Hydrocodone-acetaminophen, Lipitor [atorvastatin], and Latex   Social History   Socioeconomic History   Marital status: Married    Spouse name: Kim   Number of children: 3   Years of education: Not on file   Highest education level: Not on file  Occupational History   Occupation: retired  Tobacco Use   Smoking status: Former    Types: Landscape architect, Pipe    Quit date: 11/30/1999    Years since quitting: 22.0   Smokeless tobacco: Current    Types: Snuff   Tobacco comments:    stopped smoking pipe tobacco 30 years ago  Vaping Use   Vaping Use: Never used  Substance and Sexual Activity   Alcohol use: Yes    Comment: Occasional Beer    Drug use: Never   Sexual activity: Not on file  Other Topics Concern   Not on file  Social History Narrative   Not on file   Social Determinants of Health   Financial Resource Strain: Not on file  Food Insecurity: Not on file  Transportation Needs: Not on file  Physical Activity: Not on file  Stress: Not on file  Social Connections: Not on file     Family History: The patient's family history includes Blindness in his father; CAD in his father; Cancer in his father; Heart Problems in his mother; Hypertension in his father; Rheum arthritis in his mother. There is no history of Colon cancer, Esophageal cancer, Rectal cancer, or Stomach cancer.  ROS:   Please see the history of present illness.    All other systems reviewed and are negative.  EKGs/Labs/Other Studies Reviewed:    The following studies were reviewed today:  Secondary Discharge Diagnosis:  CAD s/p CABG, hx of left atrial appendage thrombus which is now resolved, HLD, DM2, and HTN.    Procedures This Admission:   Transeptal Puncture Intra-procedural TEE which showed no LAA thrombus Left atrial appendage occlusive device placement on 11/12/21 by Dr. Quentin Moore.    Conclusion: Successful implantation of a 24 mm Watchman FLX  left atrial appendage occlusion device using fluoroscopic and transesophageal echo guidance   EKG:  EKG is  ordered today.  The ekg ordered today demonstrates NSR with HR 63bpm.  Recent Labs: 12/19/2020: Magnesium 1.8 06/17/2021: ALT 16 11/05/2021: BUN 19; Creatinine, Ser 0.99; Hemoglobin 14.2; Platelets 152; Potassium 4.2; Sodium 139  Recent Lipid Panel    Component Value Date/Time   CHOL 136 06/17/2021 0850   TRIG 106 06/17/2021 0850   HDL 50 06/17/2021 0850   CHOLHDL 2.7 06/17/2021 0850   LDLCALC 67 06/17/2021 0850   Physical Exam:    VS:  BP 126/62    Pulse 63    Ht 5\' 6"  (1.676 m)    Wt 198 lb 12.8 oz (90.2 kg)    SpO2 98%    BMI 32.09 kg/m     Wt Readings from Last 3 Encounters:  12/14/21 198 lb 12.8 oz (90.2 kg)  11/12/21 190 lb (86.2 kg)  11/05/21 197 lb 6.4 oz (89.5 kg)    General: Well developed, well nourished, NAD Lungs:Clear to ausculation bilaterally. No wheezes, rales, or rhonchi. Breathing is unlabored. Cardiovascular: RRR with S1 S2. No murmurs Extremities: No edema.  Neuro: Alert and oriented. No focal deficits. No facial asymmetry. MAE spontaneously. Psych: Responds to questions appropriately with normal affect.    ASSESSMENT/PLAN:    Atrial fibrillation with LAAO closure: s/p Watchman implant 01/13/21 with Dr. Quentin Moore with 5mm Watchman FLX using fluoroscopic and transesophageal echo guidance. He progressed very well after his procedure and was as a candidate for same day procedure discharge. Medication plan was to resume PTA Eliquis 5mg  PO BID along with ASA 81mg  PO QD. A repeat TEE planned for 12/29/21 will be performed to ensure proper seal of the device. At that time, Dr. Quentin Moore will inform me of the next medication change. Obtain CBC, BMET  today.  After careful review of history and examination, the risks and benefits of transesophageal echocardiogram have been explained including risks of esophageal damage, perforation (1:10,000 risk), bleeding, pharyngeal hematoma as well as other potential complications associated with conscious sedation including aspiration, arrhythmia, respiratory failure and death. Alternatives to treatment were discussed, questions were answered. Patient is willing to proceed.   CAD s/p CABG x4 09/2020: Doing well with no anginal symptoms. Continue current regimen.   HLD: Last LDL, 67 from 05/2021. Continue Omega 3  DM2: Continue metformin, followed by PCP.   HTN: Stable, 126/62. No changes needed today.   Medication Adjustments/Labs and Tests Ordered: Current medicines are reviewed at length with the patient today.  Concerns regarding medicines are outlined above.  Orders Placed This Encounter  Procedures   CBC   Basic metabolic panel   EKG [id number]   No orders of the defined types were placed in this encounter.   Patient Instructions  Medication Instructions:    Your physician recommends that you continue on your current medications as directed. Please refer to the Current Medication list given to you today.  *If you need a refill on your cardiac medications before your next appointment, please call your pharmacy*   Lab Work:  BMET AND CBC TODAY   If you have labs (blood work) drawn today and your tests are completely normal, you will receive your results only by: [place] (if you have MyChart) OR A paper copy in the mail If you have any lab test that is abnormal or we need to change your treatment, we will call you to review the results.   Testing/Procedures: Your  physician has requested that you have a TEE. During a TEE, sound waves are used to create images of your heart. It provides your doctor with information about the size and shape of your heart and how well your hearts  chambers and valves are working. In this test, a transducer is attached to the end of a flexible tube thats guided down your throat and into your esophagus (the tube leading from you mouth to your stomach) to get a more detailed image of your heart. You are not awake for the procedure. Please see the instruction sheet given to you today. For further information please visit [email or web address].    Follow-Up: At [facility], you and your health needs are our priority.  As part of our continuing mission to provide you with exceptional heart care, we have created designated Provider Care Teams.  These Care Teams include your primary Cardiologist (physician) and Advanced Practice Providers (APPs -  Physician Assistants and Nurse Practitioners) who all work together to provide you with the care you need, when you need it.  We recommend signing up for the patient portal called "MyChart".  Sign up information is provided on this After Visit Summary.  MyChart is used to connect with patients for Virtual Visits (Telemedicine).  Patients are able to view lab/test results, encounter notes, upcoming appointments, etc.  Non-urgent messages can be sent to your provider as well.   To learn more about what you can do with MyChart, go to [email or web address].    Your next appointment:   6 month(s)  The format for your next appointment:   In Person  Provider:   Lars Mage, MD    Other Instructions    Signed, Kathyrn Drown, NP  12/14/2021 11:06 AM    [place]

## 2021-12-08 NOTE — Progress Notes (Signed)
[facility]                                     Cardiology Office Note:    Date:  12/14/2021   ID:  Derek Moore, DOB [birth year], MRN [id number]  PCP:  Raina Mina., MD  [facility] Cardiologist:  Berniece Salines, DO  [facility] Electrophysiologist:  Vickie Epley, MD   Referring MD: Raina Mina., MD   Chief Complaint  Patient presents with   Follow-up    Pre TEE   History of Present Illness:    Derek Moore is a 66 y.o. male with a hx of CAD s/p CABG x4 09/2020, postoperative (paroxysmal) atrial fibrillation, left atrial appendage thrombus on chronic anticoagulation with Eliquis 5 mg, HLD, DM2, and HTN.    The patient underwent CABG and unfortunately had issues with post operative atrial fibrillation, found to have LA thrombus. He was placed on chronic anticoagulation with Eliquis 5mg  BID. He wore a ZIO monitor 12/08/2020 that showed low AF burden. He was eventually referred to Dr. Quentin Ore for the evaluation of Watchman implant due to iron deficiency anemia and significant bruising while on oral anticoagulation. He was hoping to explore options for stroke risk mitigation that would allow him to avoid long-term exposure to anticoagulation. Plan at that time was to obtain pre Watchman CT scans for further evaluation of his anatomy. CT performed 11/02/21 showed a left atrial appendage which was a large chicken wing morphology without thrombus suitable for a 27 mm watchman FLX device with 24% compression with an inferior posterior IAS puncture site is recommended at RAO 15 CRA 12 for optimal deployment.    He then presented to [facility] 11/12/21 for planned Watchman implant with Dr. Quentin Ore with 30mm Watchman FLX using fluoroscopic and transesophageal echo guidance. He progressed very well after his procedure and was as a candidate for same day procedure discharge. Medication plan was to resume PTA Eliquis 5mg  PO BID along with ASA 81mg  PO QD. A repeat TEE at  approximately 45 days will be performed to ensure proper seal of the device.   Today Derek Moore is doing very well. He is tolerating antiplatelet therapy with no issues. He denies recent palpitations, SOB, chest pain, LE edema, dizziness, or syncope.   Past Medical History:  Diagnosis Date   Abnormal EKG 03/09/2018   Acquired hypothyroidism 03/07/2018   Allergy    Aortic atherosclerosis ([place]) 07/17/2020   Formatting of this note might be different from the original. Seen on [facility] 06/2019   Arthritis    Asthma    CAD (coronary artery disease), native coronary artery    Cataract    Chronic gout without tophus 03/07/2018   Coronary artery calcification seen on CAT scan 07/17/2020   Formatting of this note might be different from the original. 06/2019. [facility]   Cough syncope 1999   Dental caries    Depression 03/07/2018   Diplopia 10/11/2018   DISH (diffuse idiopathic skeletal hyperostosis) 07/17/2020   Formatting of this note might be different from the original. Seen on [facility] 06/2019   Dizzy 05/09/2018   Gastroesophageal reflux disease without esophagitis 03/07/2018   Formatting of this note might be different from the original. Long term   GERD (gastroesophageal reflux disease) 03/07/2018   Gout 03/07/2018   Hay fever    Heel spur, left 02/07/2020   Hyperlipidemia  03/07/2018   Hypothyroidism (acquired) 03/07/2018   Formatting of this note might be different from the original. 1990s.   Mixed hyperlipidemia 03/07/2018   Obesity    Ocular myasthenia gravis ([place]) 07/17/2020   Formatting of this note might be different from the original. Ocular.  Follows at [facility].  Sees neurology and eye doctor.   Palpitation 03/08/2018   Plantar fasciitis 02/07/2020   Pneumonia    Presence of Watchman left atrial appendage closure device 11/12/2021   with 66mm Watchman FLX woth Dr. Quentin Ore   Primary osteoarthritis involving multiple joints 07/17/2020   Recurrent sinusitis    Smokeless tobacco  use 07/17/2020   Formatting of this note might be different from the original. Discussed chantix.  He wants to use. Did well in past. Off label, he understands.   Thrombus of left atrial appendage    Tightness of heel cord, left 02/07/2020   Type 2 diabetes mellitus ([place]) 03/07/2018   Type 2 diabetes mellitus without complication, without long-term current use of insulin ([place]) 03/07/2018   Formatting of this note might be different from the original. 2014.    Past Surgical History:  Procedure Laterality Date   CATARACT EXTRACTION W/ INTRAOCULAR LENS  IMPLANT, BILATERAL     COLONOSCOPY W/ POLYPECTOMY  11/27/2012   Colonic polyp, status post polypectomy. Mild sigmoid diverticulosis.   CORONARY ARTERY BYPASS GRAFT N/A 10/27/2020   Procedure: CORONARY ARTERY BYPASS GRAFTING (CABG), ON PUMP, TIMES FOUR, USING LEFT INTERNAL MAMMARY ARTERY AND ENDOSCOPICALLY HARVESTED RIGHT GREATER SAPHENOUS VEIN;  Surgeon: Gaye Pollack, MD;  Location: [place];  Service: Open Heart Surgery;  Laterality: N/A;   detatched retina     ESOPHAGOGASTRODUODENOSCOPY  04/17/2007   Small hiatal hernia. Mild gastritis. Otherwise normal EGD   kidney stone extraction     cystoscopy x 2   KNEE SURGERY Left    bone spurs   LEFT ATRIAL APPENDAGE OCCLUSION N/A 11/12/2021   Procedure: LEFT ATRIAL APPENDAGE OCCLUSION;  Surgeon: Vickie Epley, MD;  Location: [place] [facility];  Service: Cardiovascular;  Laterality: N/A;   LEFT HEART CATH AND CORONARY ANGIOGRAPHY N/A 10/08/2020   Procedure: LEFT HEART CATH AND CORONARY ANGIOGRAPHY;  Surgeon: Leonie Man, MD;  Location: [place] [facility];  Service: Cardiovascular;  Laterality: N/A;   NOSE SURGERY     TEE WITHOUT CARDIOVERSION N/A 10/27/2020   Procedure: TRANSESOPHAGEAL ECHOCARDIOGRAM (TEE);  Surgeon: Gaye Pollack, MD;  Location: [place];  Service: Open Heart Surgery;  Laterality: N/A;   TEE WITHOUT CARDIOVERSION N/A 01/08/2021   Procedure: TRANSESOPHAGEAL  ECHOCARDIOGRAM (TEE);  Surgeon: Dorothy Spark, MD;  Location: [facility] ENDOSCOPY;  Service: Cardiovascular;  Laterality: N/A;   TEE WITHOUT CARDIOVERSION N/A 11/12/2021   Procedure: TRANSESOPHAGEAL ECHOCARDIOGRAM (TEE);  Surgeon: Vickie Epley, MD;  Location: [place] [facility];  Service: Cardiovascular;  Laterality: N/A;    Current Medications: Current Meds  Medication Sig   acetaminophen (TYLENOL) 500 MG tablet Take 500-1,000 mg by mouth every 6 (six) hours as needed (for pain.).   albuterol (PROVENTIL) (2.5 MG/3ML) 0.083% nebulizer solution Take 2.5 mg by nebulization every 6 (six) hours as needed for wheezing or shortness of breath.   albuterol (VENTOLIN HFA) 108 (90 Base) MCG/ACT inhaler Inhale into the lungs every 6 (six) hours as needed for wheezing or shortness of breath.   apixaban (ELIQUIS) 5 MG TABS tablet TAKE 1 TABLET BY MOUTH  TWICE DAILY   aspirin EC 81 MG tablet Take 1 tablet (81  mg total) by mouth daily.   Dextran 70-Hypromellose, PF, (ARTIFICIAL TEARS PF) 0.1-0.3 % SOLN Place 1 drop into both eyes 3 (three) times daily as needed (dry/irritated eyes.).   doxycycline (VIBRAMYCIN) 100 MG capsule Take by mouth.   ferrous sulfate 325 (65 FE) MG EC tablet Take 325 mg by mouth 3 (three) times a week.   JARDIANCE 10 MG TABS tablet Take 10 mg by mouth daily.   levothyroxine (SYNTHROID) 137 MCG tablet Take 137 mcg by mouth daily before breakfast.   Magnesium Oxide 250 MG TABS Take by mouth.   metFORMIN (GLUCOPHAGE) 1000 MG tablet Take 1,000 mg by mouth 2 (two) times daily with a meal.   mycophenolate (CELLCEPT) 500 MG tablet Take by mouth.   Omega-3 Fatty Acids (FISH OIL ULTRA) 1400 MG CAPS Take 1,400 mg by mouth in the morning and at bedtime.   pantoprazole (PROTONIX) 40 MG tablet Take 40 mg by mouth daily.   PARoxetine (PAXIL) 40 MG tablet Take 40 mg by mouth daily.   pyridostigmine (MESTINON) 60 MG tablet Take 60 mg by mouth 4 (four) times daily.   rosuvastatin (CRESTOR) 10 MG  tablet TAKE 1 TABLET BY MOUTH  DAILY   Semaglutide,0.25 or 0.5MG /DOS, (OZEMPIC, 0.25 OR 0.5 MG/DOSE,) 2 MG/1.5ML SOPN Inject 0.5 mg into the skin every Wednesday.    vitamin B-12 (CYANOCOBALAMIN) 1000 MCG tablet Take 1,000 mcg by mouth daily.     Allergies:   Codeine, Hydrocodone-acetaminophen, Lipitor [atorvastatin], and Latex   Social History   Socioeconomic History   Marital status: Married    Spouse name: Kim   Number of children: 3   Years of education: Not on file   Highest education level: Not on file  Occupational History   Occupation: retired  Tobacco Use   Smoking status: Former    Types: Landscape architect, Pipe    Quit date: 11/30/1999    Years since quitting: 22.0   Smokeless tobacco: Current    Types: Snuff   Tobacco comments:    stopped smoking pipe tobacco 30 years ago  Vaping Use   Vaping Use: Never used  Substance and Sexual Activity   Alcohol use: Yes    Comment: Occasional Beer    Drug use: Never   Sexual activity: Not on file  Other Topics Concern   Not on file  Social History Narrative   Not on file   Social Determinants of Health   Financial Resource Strain: Not on file  Food Insecurity: Not on file  Transportation Needs: Not on file  Physical Activity: Not on file  Stress: Not on file  Social Connections: Not on file     Family History: The patient's family history includes Blindness in his father; CAD in his father; Cancer in his father; Heart Problems in his mother; Hypertension in his father; Rheum arthritis in his mother. There is no history of Colon cancer, Esophageal cancer, Rectal cancer, or Stomach cancer.  ROS:   Please see the history of present illness.    All other systems reviewed and are negative.  EKGs/Labs/Other Studies Reviewed:    The following studies were reviewed today:  Secondary Discharge Diagnosis:  CAD s/p CABG, hx of left atrial appendage thrombus which is now resolved, HLD, DM2, and HTN.    Procedures This Admission:   Transeptal Puncture Intra-procedural TEE which showed no LAA thrombus Left atrial appendage occlusive device placement on 11/12/21 by Dr. Quentin Ore.    Conclusion: Successful implantation of a 24 mm Watchman FLX  left atrial appendage occlusion device using fluoroscopic and transesophageal echo guidance   EKG:  EKG is  ordered today.  The ekg ordered today demonstrates NSR with HR 63bpm.  Recent Labs: 12/19/2020: Magnesium 1.8 06/17/2021: ALT 16 11/05/2021: BUN 19; Creatinine, Ser 0.99; Hemoglobin 14.2; Platelets 152; Potassium 4.2; Sodium 139  Recent Lipid Panel    Component Value Date/Time   CHOL 136 06/17/2021 0850   TRIG 106 06/17/2021 0850   HDL 50 06/17/2021 0850   CHOLHDL 2.7 06/17/2021 0850   LDLCALC 67 06/17/2021 0850   Physical Exam:    VS:  BP 126/62    Pulse 63    Ht 5\' 6"  (1.676 m)    Wt 198 lb 12.8 oz (90.2 kg)    SpO2 98%    BMI 32.09 kg/m     Wt Readings from Last 3 Encounters:  12/14/21 198 lb 12.8 oz (90.2 kg)  11/12/21 190 lb (86.2 kg)  11/05/21 197 lb 6.4 oz (89.5 kg)    General: Well developed, well nourished, NAD Lungs:Clear to ausculation bilaterally. No wheezes, rales, or rhonchi. Breathing is unlabored. Cardiovascular: RRR with S1 S2. No murmurs Extremities: No edema.  Neuro: Alert and oriented. No focal deficits. No facial asymmetry. MAE spontaneously. Psych: Responds to questions appropriately with normal affect.    ASSESSMENT/PLAN:    Atrial fibrillation with LAAO closure: s/p Watchman implant 01/13/21 with Dr. Quentin Ore with 58mm Watchman FLX using fluoroscopic and transesophageal echo guidance. He progressed very well after his procedure and was as a candidate for same day procedure discharge. Medication plan was to resume PTA Eliquis 5mg  PO BID along with ASA 81mg  PO QD. A repeat TEE planned for 12/29/21 will be performed to ensure proper seal of the device. At that time, Dr. Quentin Ore will inform me of the next medication change. Obtain CBC, BMET  today.  After careful review of history and examination, the risks and benefits of transesophageal echocardiogram have been explained including risks of esophageal damage, perforation (1:10,000 risk), bleeding, pharyngeal hematoma as well as other potential complications associated with conscious sedation including aspiration, arrhythmia, respiratory failure and death. Alternatives to treatment were discussed, questions were answered. Patient is willing to proceed.   CAD s/p CABG x4 09/2020: Doing well with no anginal symptoms. Continue current regimen.   HLD: Last LDL, 67 from 05/2021. Continue Omega 3  DM2: Continue metformin, followed by PCP.   HTN: Stable, 126/62. No changes needed today.   Medication Adjustments/Labs and Tests Ordered: Current medicines are reviewed at length with the patient today.  Concerns regarding medicines are outlined above.  Orders Placed This Encounter  Procedures   CBC   Basic metabolic panel   EKG [id number]   No orders of the defined types were placed in this encounter.   Patient Instructions  Medication Instructions:    Your physician recommends that you continue on your current medications as directed. Please refer to the Current Medication list given to you today.  *If you need a refill on your cardiac medications before your next appointment, please call your pharmacy*   Lab Work:  BMET AND CBC TODAY   If you have labs (blood work) drawn today and your tests are completely normal, you will receive your results only by: [place] (if you have MyChart) OR A paper copy in the mail If you have any lab test that is abnormal or we need to change your treatment, we will call you to review the results.   Testing/Procedures: Your  physician has requested that you have a TEE. During a TEE, sound waves are used to create images of your heart. It provides your doctor with information about the size and shape of your heart and how well your hearts  chambers and valves are working. In this test, a transducer is attached to the end of a flexible tube thats guided down your throat and into your esophagus (the tube leading from you mouth to your stomach) to get a more detailed image of your heart. You are not awake for the procedure. Please see the instruction sheet given to you today. For further information please visit [email or web address].    Follow-Up: At [facility], you and your health needs are our priority.  As part of our continuing mission to provide you with exceptional heart care, we have created designated Provider Care Teams.  These Care Teams include your primary Cardiologist (physician) and Advanced Practice Providers (APPs -  Physician Assistants and Nurse Practitioners) who all work together to provide you with the care you need, when you need it.  We recommend signing up for the patient portal called "MyChart".  Sign up information is provided on this After Visit Summary.  MyChart is used to connect with patients for Virtual Visits (Telemedicine).  Patients are able to view lab/test results, encounter notes, upcoming appointments, etc.  Non-urgent messages can be sent to your provider as well.   To learn more about what you can do with MyChart, go to [email or web address].    Your next appointment:   6 month(s)  The format for your next appointment:   In Person  Provider:   Lars Mage, MD    Other Instructions    Signed, Kathyrn Drown, NP  12/14/2021 11:06 AM    [place]

## 2021-12-14 ENCOUNTER — Other Ambulatory Visit: Payer: Self-pay

## 2021-12-14 ENCOUNTER — Encounter: Payer: Self-pay | Admitting: Cardiology

## 2021-12-14 ENCOUNTER — Encounter: Payer: Self-pay | Admitting: *Deleted

## 2021-12-14 ENCOUNTER — Ambulatory Visit (INDEPENDENT_AMBULATORY_CARE_PROVIDER_SITE_OTHER): Payer: Commercial Managed Care - PPO | Admitting: Cardiology

## 2021-12-14 VITALS — BP 126/62 | HR 63 | Ht 66.0 in | Wt 198.8 lb

## 2021-12-14 DIAGNOSIS — I480 Paroxysmal atrial fibrillation: Secondary | ICD-10-CM

## 2021-12-14 DIAGNOSIS — Z01818 Encounter for other preprocedural examination: Secondary | ICD-10-CM

## 2021-12-14 DIAGNOSIS — E782 Mixed hyperlipidemia: Secondary | ICD-10-CM

## 2021-12-14 DIAGNOSIS — Z95818 Presence of other cardiac implants and grafts: Secondary | ICD-10-CM | POA: Diagnosis not present

## 2021-12-14 DIAGNOSIS — Z951 Presence of aortocoronary bypass graft: Secondary | ICD-10-CM | POA: Diagnosis not present

## 2021-12-14 DIAGNOSIS — Z794 Long term (current) use of insulin: Secondary | ICD-10-CM

## 2021-12-14 DIAGNOSIS — E119 Type 2 diabetes mellitus without complications: Secondary | ICD-10-CM

## 2021-12-14 LAB — CBC
Hematocrit: 43.5 % (ref 37.5–51.0)
Hemoglobin: 14.9 g/dL (ref 13.0–17.7)
MCH: 30.3 pg (ref 26.6–33.0)
MCHC: 34.3 g/dL (ref 31.5–35.7)
MCV: 89 fL (ref 79–97)
Platelets: 200 10*3/uL (ref 150–450)
RBC: 4.91 x10E6/uL (ref 4.14–5.80)
RDW: 13.4 % (ref 11.6–15.4)
WBC: 9.4 10*3/uL (ref 3.4–10.8)

## 2021-12-14 LAB — BASIC METABOLIC PANEL
BUN/Creatinine Ratio: 21 (ref 10–24)
BUN: 23 mg/dL (ref 8–27)
CO2: 22 mmol/L (ref 20–29)
Calcium: 9.5 mg/dL (ref 8.6–10.2)
Chloride: 102 mmol/L (ref 96–106)
Creatinine, Ser: 1.08 mg/dL (ref 0.76–1.27)
Glucose: 234 mg/dL — ABNORMAL HIGH (ref 70–99)
Potassium: 4.3 mmol/L (ref 3.5–5.2)
Sodium: 138 mmol/L (ref 134–144)
eGFR: 75 mL/min/{1.73_m2} (ref >59)

## 2021-12-14 NOTE — Patient Instructions (Addendum)
Medication Instructions:    Your physician recommends that you continue on your current medications as directed. Please refer to the Current Medication list given to you today.  *If you need a refill on your cardiac medications before your next appointment, please call your pharmacy*   Lab Work:  BMET AND CBC TODAY   If you have labs (blood work) drawn today and your tests are completely normal, you will receive your results only by: MyChart Message (if you have MyChart) OR A paper copy in the mail If you have any lab test that is abnormal or we need to change your treatment, we will call you to review the results.   Testing/Procedures: Your physician has requested that you have a TEE. During a TEE, sound waves are used to create images of your heart. It provides your doctor with information about the size and shape of your heart and how well your hearts chambers and valves are working. In this test, a transducer is attached to the end of a flexible tube thats guided down your throat and into your esophagus (the tube leading from you mouth to your stomach) to get a more detailed image of your heart. You are not awake for the procedure. Please see the instruction sheet given to you today. For further information please visit [email or web address]    Follow-Up: At [facility], you and your health needs are our priority.  As part of our continuing mission to provide you with exceptional heart care, we have created designated Provider Care Teams.  These Care Teams include your primary Cardiologist (physician) and Advanced Practice Providers (APPs -  Physician Assistants and Nurse Practitioners) who all work together to provide you with the care you need, when you need it.  We recommend signing up for the patient portal called "MyChart".  Sign up information is provided on this After Visit Summary.  MyChart is used to connect with patients for Virtual Visits (Telemedicine).  Patients are able  to view lab/test results, encounter notes, upcoming appointments, etc.  Non-urgent messages can be sent to your provider as well.   To learn more about what you can do with MyChart, go to [email or web address].    Your next appointment:   6 month(s)  The format for your next appointment:   In Person  Provider:   Steffanie Dunn, MD    Other Instructions

## 2021-12-15 ENCOUNTER — Encounter (HOSPITAL_COMMUNITY): Payer: Self-pay | Admitting: Cardiology

## 2021-12-21 ENCOUNTER — Encounter (HOSPITAL_COMMUNITY): Payer: Self-pay | Admitting: Cardiovascular Disease

## 2021-12-29 ENCOUNTER — Encounter (HOSPITAL_COMMUNITY)
Admission: RE | Disposition: A | Discharge status: Home / Self Care | Payer: Self-pay | Source: Home / Self Care | Attending: Cardiovascular Disease

## 2021-12-29 ENCOUNTER — Ambulatory Visit (HOSPITAL_COMMUNITY): Payer: Commercial Managed Care - PPO | Admitting: Certified Registered"

## 2021-12-29 ENCOUNTER — Other Ambulatory Visit: Payer: Self-pay

## 2021-12-29 ENCOUNTER — Ambulatory Visit (HOSPITAL_COMMUNITY)
Admission: RE | Admit: 2021-12-29 | Discharge: 2021-12-29 | Disposition: A | Discharge status: Home / Self Care | Payer: Commercial Managed Care - PPO | Attending: Cardiovascular Disease | Admitting: Cardiovascular Disease

## 2021-12-29 ENCOUNTER — Encounter (HOSPITAL_COMMUNITY): Payer: Self-pay | Admitting: Cardiovascular Disease

## 2021-12-29 ENCOUNTER — Ambulatory Visit (HOSPITAL_BASED_OUTPATIENT_CLINIC_OR_DEPARTMENT_OTHER)
Admission: RE | Admit: 2021-12-29 | Discharge: 2021-12-29 | Disposition: A | Discharge status: Home / Self Care | Payer: Commercial Managed Care - PPO | Source: Home / Self Care | Attending: Cardiology | Admitting: Cardiology

## 2021-12-29 DIAGNOSIS — Z7984 Long term (current) use of oral hypoglycemic drugs: Secondary | ICD-10-CM | POA: Diagnosis not present

## 2021-12-29 DIAGNOSIS — I480 Paroxysmal atrial fibrillation: Secondary | ICD-10-CM | POA: Diagnosis not present

## 2021-12-29 DIAGNOSIS — I4891 Unspecified atrial fibrillation: Secondary | ICD-10-CM | POA: Insufficient documentation

## 2021-12-29 DIAGNOSIS — I700 Atherosclerosis of aorta: Secondary | ICD-10-CM | POA: Insufficient documentation

## 2021-12-29 DIAGNOSIS — E785 Hyperlipidemia, unspecified: Secondary | ICD-10-CM | POA: Insufficient documentation

## 2021-12-29 DIAGNOSIS — E119 Type 2 diabetes mellitus without complications: Secondary | ICD-10-CM | POA: Insufficient documentation

## 2021-12-29 DIAGNOSIS — Z951 Presence of aortocoronary bypass graft: Secondary | ICD-10-CM | POA: Diagnosis not present

## 2021-12-29 DIAGNOSIS — Z95818 Presence of other cardiac implants and grafts: Secondary | ICD-10-CM

## 2021-12-29 DIAGNOSIS — I2510 Atherosclerotic heart disease of native coronary artery without angina pectoris: Secondary | ICD-10-CM | POA: Insufficient documentation

## 2021-12-29 DIAGNOSIS — I10 Essential (primary) hypertension: Secondary | ICD-10-CM | POA: Insufficient documentation

## 2021-12-29 DIAGNOSIS — T148XXA Other injury of unspecified body region, initial encounter: Secondary | ICD-10-CM

## 2021-12-29 HISTORY — PX: TEE WITHOUT CARDIOVERSION: SHX5443

## 2021-12-29 LAB — GLUCOSE, CAPILLARY: Glucose-Capillary: 196 mg/dL — ABNORMAL HIGH (ref 70–99)

## 2021-12-29 SURGERY — ECHOCARDIOGRAM, TRANSESOPHAGEAL
Anesthesia: Monitor Anesthesia Care

## 2021-12-29 MED ORDER — SODIUM CHLORIDE 0.9 % IV SOLN: INTRAVENOUS | Status: DC

## 2021-12-29 MED ORDER — GLYCOPYRROLATE 0.2 MG/ML IJ SOLN
INTRAMUSCULAR | Status: DC | PRN
  Administered 2021-12-29: .1 mg via INTRAVENOUS

## 2021-12-29 MED ORDER — BUTAMBEN-TETRACAINE-BENZOCAINE 2-2-14 % EX AERO
INHALATION_SPRAY | CUTANEOUS | Status: DC | PRN
  Administered 2021-12-29: 1 via TOPICAL

## 2021-12-29 MED ORDER — PROPOFOL 500 MG/50ML IV EMUL
INTRAVENOUS | Status: DC | PRN
  Administered 2021-12-29: 150 ug/kg/min via INTRAVENOUS

## 2021-12-29 MED ORDER — PROPOFOL 10 MG/ML IV BOLUS
INTRAVENOUS | Status: DC | PRN
  Administered 2021-12-29: 30 mg via INTRAVENOUS

## 2021-12-29 MED ORDER — LIDOCAINE HCL (CARDIAC) PF 100 MG/5ML IV SOSY
PREFILLED_SYRINGE | INTRAVENOUS | Status: DC | PRN
Start: 2021-12-29 — End: 2021-12-29
  Administered 2021-12-29: 60 mg via INTRAVENOUS

## 2021-12-29 NOTE — Transfer of Care (Signed)
Immediate Anesthesia Transfer of Care Note  Patient: Derek Moore  Procedure(s) Performed: TRANSESOPHAGEAL ECHOCARDIOGRAM (TEE)  Patient Location: PACU  Anesthesia Type:MAC  Level of Consciousness: drowsy  Airway & Oxygen Therapy: Patient Spontanous Breathing and Patient connected to nasal cannula oxygen  Post-op Assessment: Report given to RN and Post -op Vital signs reviewed and stable  Post vital signs: Reviewed and stable  Last Vitals:  Vitals Value Taken Time  BP 89/63 12/29/21 0851  Temp 36.5 C 12/29/21 0850  Pulse 60 12/29/21 0852  Resp 14 12/29/21 0852  SpO2 99 % 12/29/21 0852  Vitals shown include unvalidated device data.  Last Pain: There were no vitals filed for this visit.       Complications: No notable events documented.

## 2021-12-29 NOTE — Op Note (Signed)
INDICATIONS: Watchman follow up  PROCEDURE:   Informed consent was obtained prior to the procedure. The risks, benefits and alternatives for the procedure were discussed and the patient comprehended these risks.  Risks include, but are not limited to, cough, sore throat, vomiting, nausea, somnolence, esophageal and stomach trauma or perforation, bleeding, low blood pressure, aspiration, pneumonia, infection, trauma to the teeth and death.    After a procedural time-out, the oropharynx was anesthetized with 20% benzocaine spray.   During this procedure the patient was administered IV propofol by Anesthesiology, Dr. Glennon Mac.  The transesophageal probe was inserted in the esophagus and stomach without difficulty and multiple views were obtained.  The patient was kept under observation until the patient left the procedure room.  The patient left the procedure room in stable condition.   Agitated microbubble saline contrast was not administered.  COMPLICATIONS:    There were no immediate complications.  FINDINGS:  Well seated Watchman device with excellent seal, no leak.  Tiny residual iatrogenic ASD with left to right shunt. Moderate atherosclerosis in the descending aorta.  RECOMMENDATIONS:     Discontinue anticoagulation per Watchman protocol.  Time Spent Directly with the Patient:  30 minutes   Derek Moore 12/29/2021, 8:46 AM

## 2021-12-29 NOTE — Interval H&P Note (Signed)
History and Physical Interval Note: ° °12/29/2021 °7:33 AM ° °Derek Moore  has presented today for surgery, with the diagnosis of POST WATCHMAN EVALUATION.  The various methods of treatment have been discussed with the patient and family. After consideration of risks, benefits and other options for treatment, the patient has consented to  Procedure(s): °TRANSESOPHAGEAL ECHOCARDIOGRAM (TEE) (N/A) as a surgical intervention.  The patient's history has been reviewed, patient examined, no change in status, stable for surgery.  I have reviewed the patient's chart and labs.  Questions were answered to the patient's satisfaction.   ° ° °[name] ° ° °

## 2021-12-29 NOTE — Anesthesia Preprocedure Evaluation (Addendum)
Anesthesia Evaluation  Patient identified by MRN, date of birth, ID band Patient awake    Reviewed: Allergy & Precautions, NPO status , Patient's Chart, lab work & pertinent test results  History of Anesthesia Complications Negative for: history of anesthetic complications  Airway Mallampati: II  TM Distance: >3 FB Neck ROM: Full    Dental  (+) Dental Advisory Given   Pulmonary COPD, former smoker,    breath sounds clear to auscultation       Cardiovascular (-) angina+ CAD and + CABG  + dysrhythmias Atrial Fibrillation  Rhythm:Regular Rate:Normal  10/2021 ECHO: EF 60-65%. The LV has normal function, no regional wall motion abnormalities. RVS is normal.  there is a well-seated 24 mm Watchman device in the left atrial appendage without peri-device leak and with  appropriate amount of compression.. Left atrial size was mildly dilated.    Neuro/Psych Depression Myasthenia gravis: CellCept, Mestinon    GI/Hepatic Neg liver ROS, GERD  Medicated and Controlled,  Endo/Other  diabetes, Oral Hypoglycemic AgentsHypothyroidism   Renal/GU negative Renal ROS     Musculoskeletal   Abdominal (+) + obese,   Peds  Hematology eliquis   Anesthesia Other Findings   Reproductive/Obstetrics                            Anesthesia Physical Anesthesia Plan  ASA: 3  Anesthesia Plan: MAC   Post-op Pain Management: Minimal or no pain anticipated   Induction:   PONV Risk Score and Plan: 1 and Treatment may vary due to age or medical condition and Ondansetron  Airway Management Planned: Natural Airway and Nasal Cannula  Additional Equipment:   Intra-op Plan:   Post-operative Plan:   Informed Consent: I have reviewed the patients History and Physical, chart, labs and discussed the procedure including the risks, benefits and alternatives for the proposed anesthesia with the patient or authorized representative  who has indicated his/her understanding and acceptance.     Dental advisory given  Plan Discussed with: CRNA and Surgeon  Anesthesia Plan Comments:        Anesthesia Quick Evaluation

## 2021-12-29 NOTE — Anesthesia Postprocedure Evaluation (Signed)
Anesthesia Post Note  Patient: HYDEN SOLEY  Procedure(s) Performed: TRANSESOPHAGEAL ECHOCARDIOGRAM (TEE)     Patient location during evaluation: Endoscopy Anesthesia Type: MAC Level of consciousness: awake and alert, patient cooperative and oriented Pain management: pain level controlled Vital Signs Assessment: post-procedure vital signs reviewed and stable Respiratory status: nonlabored ventilation, spontaneous breathing and respiratory function stable Cardiovascular status: stable and blood pressure returned to baseline Postop Assessment: no apparent nausea or vomiting and able to ambulate Anesthetic complications: no   No notable events documented.  Last Vitals:  Vitals:   12/29/21 0850 12/29/21 0905  BP: 101/68 102/65  Pulse: 60 (!) 56  Resp: 17 13  Temp: 36.5 C 36.5 C  SpO2: 95% 95%    Last Pain:  Vitals:   12/29/21 0905  PainSc: 0-No pain                 Lameisha Schuenemann,E. Rylin Seavey

## 2021-12-29 NOTE — Interval H&P Note (Signed)
History and Physical Interval Note:  12/29/2021 7:33 AM  Derek Moore  has presented today for surgery, with the diagnosis of POST WATCHMAN EVALUATION.  The various methods of treatment have been discussed with the patient and family. After consideration of risks, benefits and other options for treatment, the patient has consented to  Procedure(s): TRANSESOPHAGEAL ECHOCARDIOGRAM (TEE) (N/A) as a surgical intervention.  The patient's history has been reviewed, patient examined, no change in status, stable for surgery.  I have reviewed the patient's chart and labs.  Questions were answered to the patient's satisfaction.     Freya Zobrist

## 2021-12-30 ENCOUNTER — Telehealth: Payer: Self-pay

## 2021-12-30 MED ORDER — CLOPIDOGREL BISULFATE 75 MG PO TABS: 75.0000 mg | ORAL_TABLET | Freq: Every day | ORAL | 1 refills | Status: DC

## 2021-12-30 NOTE — Telephone Encounter (Signed)
Per Dr. Lovena Neighbours procedure report, "Post Implant Anticoagulation Strategy: Apixaban 5mg  PO twice daily and Aspirin 81mg  PO daily for 45 days.  If 45-day follow up shows well seated device without thrombus or significant leak, plan to stop Apixaban. He will continue Aspirin 81mg  PO daily and start taking Plavix 75mg  PO daily to complete 6 months of post implant therapy."  Per Dr. Erin Hearing TEE report, " 3. Well seated Watchman device with appropriate compression and no leak.  There is a tiny iatrogenic ASD with left to right shunt. No left  atrial/left atrial appendage thrombus was detected."  Per Watchman protocol, instructed patient to STOP ELIQUIS and START PLAVIX 75 mg daily. Scheduled him for 6 month LAAO visit 05/12/2022. The patient was grateful for call and agrees with plan.

## 2022-02-13 DIAGNOSIS — Z862 Personal history of diseases of the blood and blood-forming organs and certain disorders involving the immune mechanism: Secondary | ICD-10-CM

## 2022-02-13 HISTORY — DX: Personal history of diseases of the blood and blood-forming organs and certain disorders involving the immune mechanism: Z86.2

## 2022-02-15 MED ORDER — AMOXICILLIN 500 MG PO TABS: 2000.0000 mg | ORAL_TABLET | ORAL | 4 refills | Status: DC | PRN

## 2022-05-12 ENCOUNTER — Ambulatory Visit: Payer: Commercial Managed Care - PPO

## 2022-05-18 NOTE — Progress Notes (Unsigned)
[facility]   MULTIDISCIPLINARY HEART VALVE CLINIC                                     Cardiology Office Note:    Date:  05/19/2022   ID:  YANIV LAGE, DOB [birth year], MRN [id number]  PCP:  Gordan Payment., MD  [facility] Cardiologist:  Thomasene Ripple, DO  [facility] Electrophysiologist:  Lanier Prude, MD   Referring MD: Gordan Payment., MD   Chief Complaint  Patient presents with   Follow-up    6 month s/p LAAO closure    History of Present Illness:    JERL MUNYAN is a 66 y.o. male with a hx of a hx of CAD s/p CABG x4 09/2020, postoperative (paroxysmal) atrial fibrillation, left atrial appendage thrombus on chronic anticoagulation with Eliquis 5 mg, HLD, DM2, and HTN.    The patient underwent CABG and unfortunately had issues with post operative atrial fibrillation, found to have LA thrombus. He was placed on chronic anticoagulation with Eliquis  BID. He wore a ZIO monitor 12/08/2020 that showed low AF burden. He was eventually referred to Dr. Lalla Brothers for the evaluation of Watchman implant due to iron deficiency anemia and significant bruising while on oral anticoagulation. He was hoping to explore options for stroke risk mitigation that would allow him to avoid long-term exposure to anticoagulation. Plan at that time was to obtain pre Watchman CT scans for further evaluation of his anatomy. CT performed 11/02/21 showed a left atrial appendage which was a large chicken wing morphology without thrombus suitable for a 27 mm watchman FLX device with 24% compression with an inferior posterior IAS puncture site is recommended at RAO 15 CRA 12 for optimal deployment.    He then presented to [facility] 11/12/21 for planned Watchman implant with Dr. Lalla Brothers with 24mm Watchman FLX. Medication plan was to resume PTA Eliquis  PO BID along with ASA  PO QD. A repeat TEE performed 12/29/21 showed proper seal of the device therefore his Eliquis was stopped and he was  started on Plavix  QD through 6 months of therapy.    Today he presents alone and reports that he has been doing very well with no CV complaints. He has tolerated Plavix without issues. He denies chest pain, LE edema, palpitations, orthopnea, bleeding in stool or urine, or syncope. His BP is slightly low today however he reports he is typically in the 110/60 range. He is on no antihypertensives.    Past Medical History:  Diagnosis Date   Abnormal EKG 03/09/2018   Acquired hypothyroidism 03/07/2018   Allergy    Aortic atherosclerosis (HCC) 07/17/2020   Formatting of this note might be different from the original. Seen on CT [facility] 06/2019   Arthritis    Asthma    CAD (coronary artery disease), native coronary artery    Cataract    Chronic gout without tophus 03/07/2018   Coronary artery calcification seen on CAT scan 07/17/2020   Formatting of this note might be different from the original. 06/2019. [facility]   Cough syncope 1999   Dental caries    Depression 03/07/2018   Diplopia 10/11/2018   DISH (diffuse idiopathic skeletal hyperostosis) 07/17/2020   Formatting of this note might be different from the original. Seen on CT [facility] 06/2019   Dizzy 05/09/2018   Gastroesophageal reflux disease without esophagitis 03/07/2018   Formatting  of this note might be different from the original. Long term   GERD (gastroesophageal reflux disease) 03/07/2018   Gout 03/07/2018   Hay fever    Heel spur, left 02/07/2020   Hyperlipidemia 03/07/2018   Hypothyroidism (acquired) 03/07/2018   Formatting of this note might be different from the original. 1990s.   Mixed hyperlipidemia 03/07/2018   Obesity    Ocular myasthenia gravis (HCC) 07/17/2020   Formatting of this note might be different from the original. Ocular.  Follows at [facility].  Sees neurology and eye doctor.   Palpitation 03/08/2018   Plantar fasciitis 02/07/2020   Pneumonia    Presence of Watchman left atrial appendage closure device 11/12/2021    with 24mm Watchman FLX woth Dr. Lalla Brothers   Primary osteoarthritis involving multiple joints 07/17/2020   Recurrent sinusitis    Smokeless tobacco use 07/17/2020   Formatting of this note might be different from the original. Discussed chantix.  He wants to use. Did well in past. Off label, he understands.   Thrombus of left atrial appendage    Tightness of heel cord, left 02/07/2020   Type 2 diabetes mellitus (HCC) 03/07/2018   Type 2 diabetes mellitus without complication, without long-term current use of insulin (HCC) 03/07/2018   Formatting of this note might be different from the original. 2014.    Past Surgical History:  Procedure Laterality Date   CATARACT EXTRACTION W/ INTRAOCULAR LENS  IMPLANT, BILATERAL     COLONOSCOPY W/ POLYPECTOMY  11/27/2012   Colonic polyp, status post polypectomy. Mild sigmoid diverticulosis.   CORONARY ARTERY BYPASS GRAFT N/A 10/27/2020   Procedure: CORONARY ARTERY BYPASS GRAFTING (CABG), ON PUMP, TIMES FOUR, USING LEFT INTERNAL MAMMARY ARTERY AND ENDOSCOPICALLY HARVESTED RIGHT GREATER SAPHENOUS VEIN;  Surgeon: [name], MD;  Location: [facility] OR;  Service: Open Heart Surgery;  Laterality: N/A;   detatched retina     ESOPHAGOGASTRODUODENOSCOPY  04/17/2007   Small hiatal hernia. Mild gastritis. Otherwise normal EGD   kidney stone extraction     cystoscopy x 2   KNEE SURGERY Left    bone spurs   LEFT ATRIAL APPENDAGE OCCLUSION N/A 11/12/2021   Procedure: LEFT ATRIAL APPENDAGE OCCLUSION;  Surgeon: Lanier Prude, MD;  Location: [facility] INVASIVE CV LAB;  Service: Cardiovascular;  Laterality: N/A;   LEFT HEART CATH AND CORONARY ANGIOGRAPHY N/A 10/08/2020   Procedure: LEFT HEART CATH AND CORONARY ANGIOGRAPHY;  Surgeon: Marykay Lex, MD;  Location: [facility] INVASIVE CV LAB;  Service: Cardiovascular;  Laterality: N/A;   NOSE SURGERY     TEE WITHOUT CARDIOVERSION N/A 10/27/2020   Procedure: TRANSESOPHAGEAL ECHOCARDIOGRAM (TEE);  Surgeon: [name], MD;   Location: [facility] OR;  Service: Open Heart Surgery;  Laterality: N/A;   TEE WITHOUT CARDIOVERSION N/A 01/08/2021   Procedure: TRANSESOPHAGEAL ECHOCARDIOGRAM (TEE);  Surgeon: Lars Masson, MD;  Location: [facility] ENDOSCOPY;  Service: Cardiovascular;  Laterality: N/A;   TEE WITHOUT CARDIOVERSION N/A 11/12/2021   Procedure: TRANSESOPHAGEAL ECHOCARDIOGRAM (TEE);  Surgeon: Lanier Prude, MD;  Location: [facility] INVASIVE CV LAB;  Service: Cardiovascular;  Laterality: N/A;   TEE WITHOUT CARDIOVERSION N/A 12/29/2021   Procedure: TRANSESOPHAGEAL ECHOCARDIOGRAM (TEE);  Surgeon: Thurmon Fair, MD;  Location: [facility] ENDOSCOPY;  Service: Cardiovascular;  Laterality: N/A;    Current Medications: Current Meds  Medication Sig   acetaminophen (TYLENOL) 500 MG tablet Take 500-1,000 mg by mouth every 6 (six) hours as needed (for pain.).   albuterol (PROVENTIL) (2.5 MG/3ML) 0.083% nebulizer solution Take 2.5 mg by nebulization every  6 (six) hours as needed for wheezing or shortness of breath.   albuterol (VENTOLIN HFA) 108 (90 Base) MCG/ACT inhaler Inhale into the lungs every 6 (six) hours as needed for wheezing or shortness of breath.   aspirin EC 81 MG tablet Take 1 tablet (81 mg total) by mouth daily.   Dextran 70-Hypromellose, PF, (ARTIFICIAL TEARS PF) 0.1-0.3 % SOLN Place 1 drop into both eyes 3 (three) times daily as needed (dry/irritated eyes.).   ferrous sulfate 325 (65 FE) MG EC tablet Take 325 mg by mouth 3 (three) times a week.   JARDIANCE 10 MG TABS tablet Take 10 mg by mouth daily.   levothyroxine (SYNTHROID) 137 MCG tablet Take 137 mcg by mouth daily before breakfast.   Magnesium Oxide 250 MG TABS Take 250 mg by mouth daily.   metFORMIN (GLUCOPHAGE) 1000 MG tablet Take 1,000 mg by mouth 2 (two) times daily with a meal.   mycophenolate (CELLCEPT) 500 MG tablet Take 1,000 mg by mouth 2 (two) times daily.   Omega-3 Fatty Acids (FISH OIL ULTRA) 1400 MG CAPS Take 1,400 mg by mouth in the morning and at bedtime.    pantoprazole (PROTONIX) 40 MG tablet Take 40 mg by mouth daily.   PARoxetine (PAXIL) 40 MG tablet Take 40 mg by mouth daily.   pyridostigmine (MESTINON) 60 MG tablet Take 60 mg by mouth 4 (four) times daily.   rosuvastatin (CRESTOR) 10 MG tablet TAKE 1 TABLET BY MOUTH  DAILY   Semaglutide,0.25 or 0.5MG /DOS, (OZEMPIC, 0.25 OR 0.5 MG/DOSE,) 2 MG/1.5ML SOPN Inject 1 mg into the skin every Wednesday.   vitamin B-12 (CYANOCOBALAMIN) 1000 MCG tablet Take 1,000 mcg by mouth daily.   [DISCONTINUED] amoxicillin (AMOXIL) 500 MG tablet Take 4 tablets (2,000 mg total) by mouth as needed (for dental procedures.). Take 1 hour prior to dental procedures.   [DISCONTINUED] clopidogrel (PLAVIX) 75 MG tablet Take 1 tablet (75 mg total) by mouth daily.     Allergies:   Codeine, Hydrocodone-acetaminophen, Lipitor [atorvastatin], and Latex   Social History   Socioeconomic History   Marital status: Married    Spouse name: Kim   Number of children: 3   Years of education: Not on file   Highest education level: Not on file  Occupational History   Occupation: retired  Tobacco Use   Smoking status: Former    Types: Software engineer, Pipe    Quit date: 11/30/1999    Years since quitting: 22.4   Smokeless tobacco: Current    Types: Snuff   Tobacco comments:    stopped smoking pipe tobacco 30 years ago  Vaping Use   Vaping Use: Never used  Substance and Sexual Activity   Alcohol use: Yes    Comment: Occasional Beer    Drug use: Never   Sexual activity: Not on file  Other Topics Concern   Not on file  Social History Narrative   Not on file   Social Determinants of Health   Financial Resource Strain: Not on file  Food Insecurity: Not on file  Transportation Needs: Not on file  Physical Activity: Not on file  Stress: Not on file  Social Connections: Not on file    Family History: The patient's family history includes Blindness in his father; CAD in his father; Cancer in his father; Heart Problems in his  mother; Hypertension in his father; Rheum arthritis in his mother. There is no history of Colon cancer, Esophageal cancer, Rectal cancer, or Stomach cancer.  ROS:   Please  see the history of present illness.    All other systems reviewed and are negative.  EKGs/Labs/Other Studies Reviewed:    The following studies were reviewed today:  LAAO closure 11/12/21:   Procedures This Admission:  Transeptal Puncture Intra-procedural TEE which showed no LAA thrombus Left atrial appendage occlusive device placement on 11/12/21 by Dr. Lalla Brothers.    Conclusion: Successful implantation of a 24 mm Watchman FLX left atrial appendage occlusion device using fluoroscopic and transesophageal echo guidance  TEE 12/29/21:  IMPRESSIONS Left ventricular ejection fraction, by estimation, is 60 to 65%. The left ventricle has normal function. The left ventricle has no regional wall motion abnormalities. 1. 2. Right ventricular systolic function is normal. The right ventricular size is normal. Well seated Watchman device with appropriate compression and no leak. There is a tiny iatrogenic ASD with left to right shunt. No left atrial/left atrial appendage thrombus was detected. 3. The mitral valve is normal in structure. No evidence of mitral valve regurgitation. No evidence of mitral stenosis. 4. The aortic valve is normal in structure. Aortic valve regurgitation is not visualized. No aortic stenosis is present. 5. 6. There is Moderate (Grade III) protruding plaque involving the descending aorta. The inferior vena cava is normal in size with greater than 50% respiratory variability, suggesting right atrial pressure of 3 mmHg  EKG:  EKG is not ordered today.    Recent Labs: 06/17/2021: ALT 16 12/14/2021: BUN 23; Creatinine, Ser 1.08; Hemoglobin 14.9; Platelets 200; Potassium 4.3; Sodium 138   Recent Lipid Panel    Component Value Date/Time   CHOL 136 06/17/2021 0850   TRIG 106 06/17/2021 0850   HDL  50 06/17/2021 0850   CHOLHDL 2.7 06/17/2021 0850   LDLCALC 67 06/17/2021 0850   Physical Exam:    VS:  BP (!) 90/50 (BP Location: Left Arm, Patient Position: Sitting, Cuff Size: Normal)   Pulse 60   Ht 5\' 6"  (1.676 m)   Wt 180 lb (81.6 kg)   SpO2 95%   BMI 29.05 kg/m     Wt Readings from Last 3 Encounters:  05/19/22 180 lb (81.6 kg)  12/29/21 190 lb (86.2 kg)  12/14/21 198 lb 12.8 oz (90.2 kg)    General: Well developed, well nourished, NAD Skin: Warm, dry, intact  Lungs:Clear to ausculation bilaterally. Breathing is unlabored. Cardiovascular: RRR with S1 S2. No murmurs Extremities: No edema.  Neuro: Alert and oriented. No focal deficits. No facial asymmetry. MAE spontaneously. Psych: Responds to questions appropriately with normal affect.    ASSESSMENT/PLAN:    Atrial fibrillation with LAAO closure: s/p Watchman implant 11/12/21 with 27mm Watchman FLX device. Post implant TEEE 12/29/21 with welll seated device with no leak. Transitioned to Plavix 75mg  PO QD  with no issues. Instructed to stop Plavix and SBE at this time given duration since implant.  Will touch base one year post implant, otherwise can follow with Dr. 12/31/21.   CAD s/p CABG x4 09/2020: Doing well with no anginal symptoms. Continue current regimen.    HLD: Last LDL, 85 from 01/2022. Continue Omega 3   DM2: Continue metformin, Jardiance, Ozempic, followed by PCP.    HTN: Soft today however reports low BP at baseline with no symptoms. Not currently on antihypertensives.   Medication Adjustments/Labs and Tests Ordered: Current medicines are reviewed at length with the patient today.  Concerns regarding medicines are outlined above.  No orders of the defined types were placed in this encounter.  No orders of the defined types were  placed in this encounter.   Patient Instructions  Medication Instructions:  Your physician has recommended you make the following change in your medication:  STOP PLAVIX  STOP  AMOXICILLIN   *If you need a refill on your cardiac medications before your next appointment, please call your pharmacy*   Lab Work: NONE If you have labs (blood work) drawn today and your tests are completely normal, you will receive your results only by: MyChart Message (if you have MyChart) OR A paper copy in the mail If you have any lab test that is abnormal or we need to change your treatment, we will call you to review the results.   Testing/Procedures: NONE   Follow-Up: At [facility], you and your health needs are our priority.  As part of our continuing mission to provide you with exceptional heart care, we have created designated Provider Care Teams.  These Care Teams include your primary Cardiologist (physician) and Advanced Practice Providers (APPs -  Physician Assistants and Nurse Practitioners) who all work together to provide you with the care you need, when you need it.  We recommend signing up for the patient portal called "MyChart".  Sign up information is provided on this After Visit Summary.  MyChart is used to connect with patients for Virtual Visits (Telemedicine).  Patients are able to view lab/test results, encounter notes, upcoming appointments, etc.  Non-urgent messages can be sent to your provider as well.   To learn more about what you can do with MyChart, go to [email or web address].    Your next appointment:   KEEP SCHEDULED FOLLOW-UP  Important Information About Sugar         Raliegh Ip, NP  05/19/2022 9:04 AM    [facility]

## 2022-05-19 ENCOUNTER — Ambulatory Visit (INDEPENDENT_AMBULATORY_CARE_PROVIDER_SITE_OTHER): Payer: Commercial Managed Care - PPO | Admitting: Cardiology

## 2022-05-19 VITALS — BP 90/50 | HR 60 | Ht 66.0 in | Wt 180.0 lb

## 2022-05-19 DIAGNOSIS — I2510 Atherosclerotic heart disease of native coronary artery without angina pectoris: Secondary | ICD-10-CM | POA: Diagnosis not present

## 2022-05-19 DIAGNOSIS — E782 Mixed hyperlipidemia: Secondary | ICD-10-CM

## 2022-05-19 DIAGNOSIS — Z95818 Presence of other cardiac implants and grafts: Secondary | ICD-10-CM | POA: Diagnosis not present

## 2022-05-19 DIAGNOSIS — I480 Paroxysmal atrial fibrillation: Secondary | ICD-10-CM

## 2022-05-19 DIAGNOSIS — Z951 Presence of aortocoronary bypass graft: Secondary | ICD-10-CM

## 2022-05-19 NOTE — Patient Instructions (Signed)
Medication Instructions:  Your physician has recommended you make the following change in your medication:  STOP PLAVIX  STOP AMOXICILLIN   *If you need a refill on your cardiac medications before your next appointment, please call your pharmacy*   Lab Work: NONE If you have labs (blood work) drawn today and your tests are completely normal, you will receive your results only by: MyChart Message (if you have MyChart) OR A paper copy in the mail If you have any lab test that is abnormal or we need to change your treatment, we will call you to review the results.   Testing/Procedures: NONE   Follow-Up: At [facility], you and your health needs are our priority.  As part of our continuing mission to provide you with exceptional heart care, we have created designated Provider Care Teams.  These Care Teams include your primary Cardiologist (physician) and Advanced Practice Providers (APPs -  Physician Assistants and Nurse Practitioners) who all work together to provide you with the care you need, when you need it.  We recommend signing up for the patient portal called "MyChart".  Sign up information is provided on this After Visit Summary.  MyChart is used to connect with patients for Virtual Visits (Telemedicine).  Patients are able to view lab/test results, encounter notes, upcoming appointments, etc.  Non-urgent messages can be sent to your provider as well.   To learn more about what you can do with MyChart, go to [email or web address].    Your next appointment:   KEEP SCHEDULED FOLLOW-UP  Important Information About Sugar

## 2022-06-02 ENCOUNTER — Other Ambulatory Visit: Payer: Self-pay | Admitting: Cardiology

## 2022-07-01 DIAGNOSIS — G3184 Mild cognitive impairment, so stated: Secondary | ICD-10-CM

## 2022-07-01 HISTORY — DX: Mild cognitive impairment of uncertain or unknown etiology: G31.84

## 2022-09-22 DIAGNOSIS — R09A2 Foreign body sensation, throat: Secondary | ICD-10-CM | POA: Insufficient documentation

## 2022-09-22 HISTORY — DX: Foreign body sensation, throat: R09.A2

## 2022-10-04 IMAGING — DX DG CHEST 1V PORT
1 series · 1 of 1 positions shown · non-contrast
Comparison: Portable exam 3434 hours compared to 10/28/2020

CLINICAL DATA: Post CABG

EXAM:
PORTABLE CHEST 1 VIEW

[chest ap]
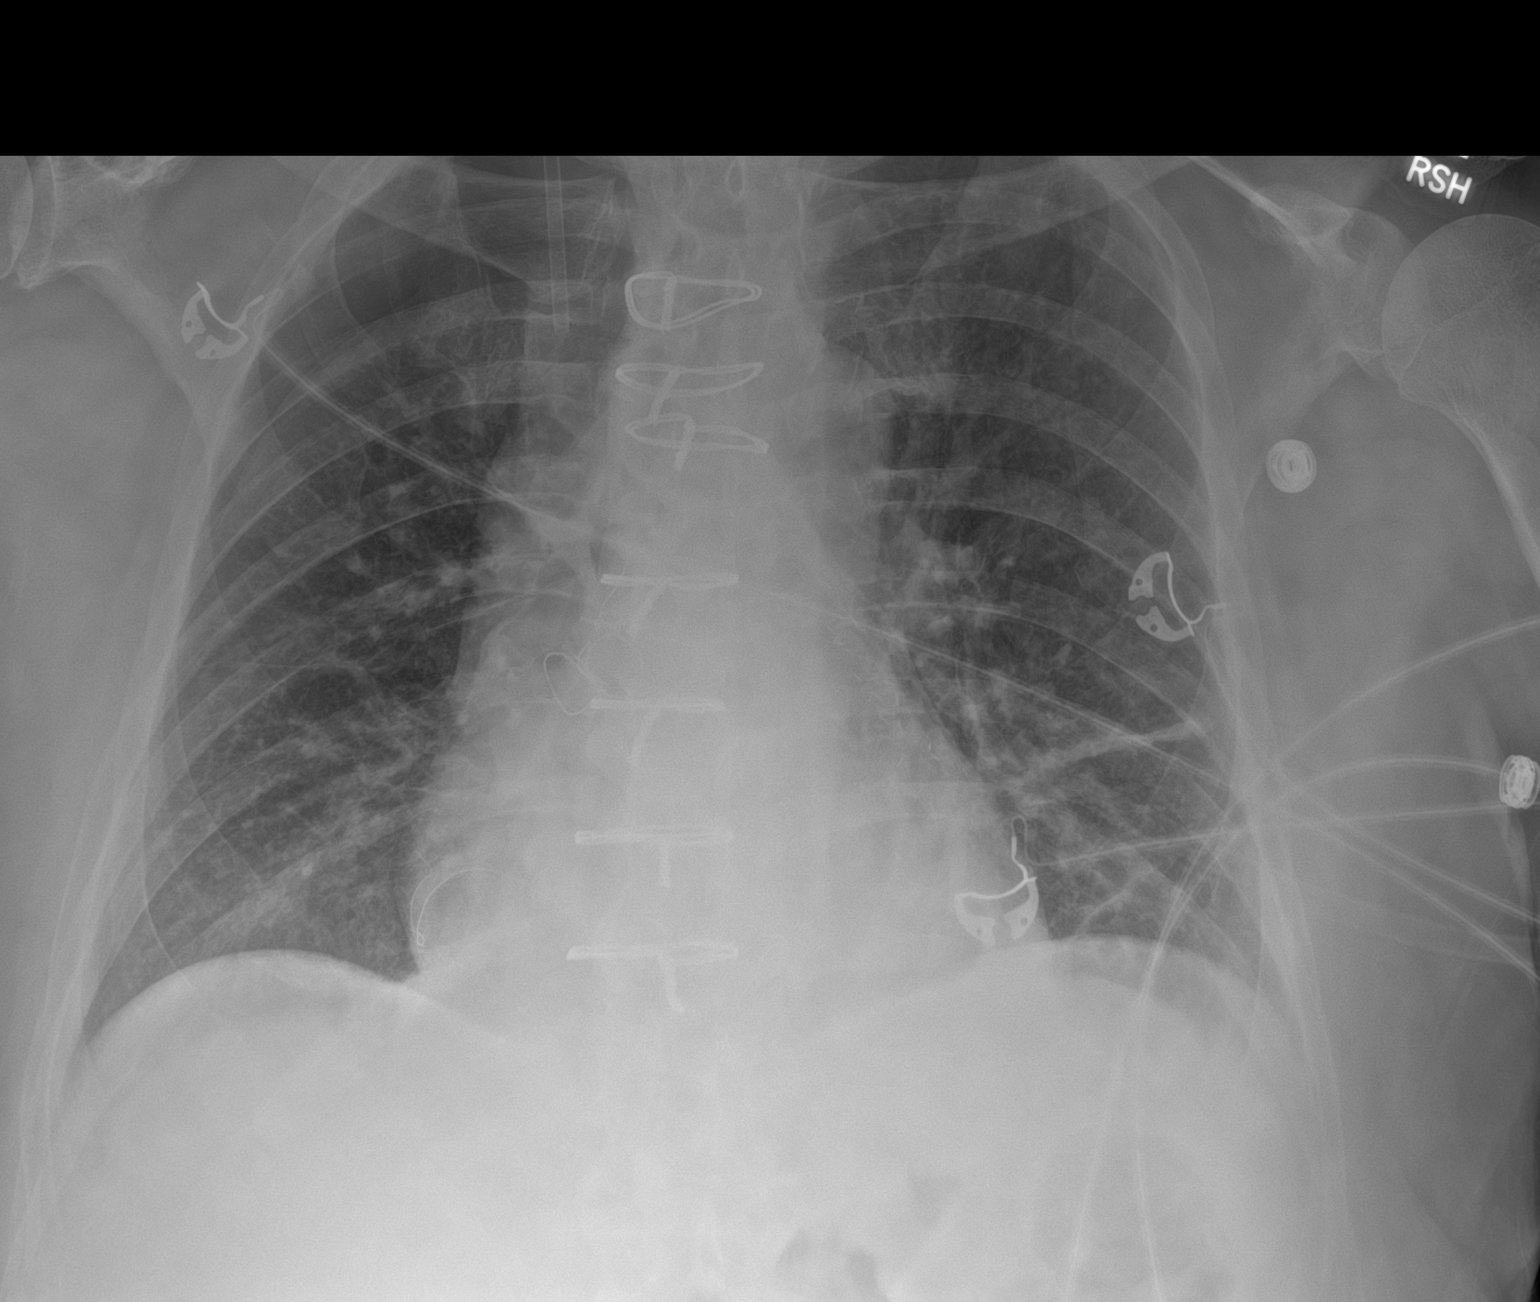

[1 of 1 positions shown; findings below may reference images not displayed]

FINDINGS: Epicardial pacing wires present.

RIGHT jugular central venous catheter with tip projecting over
confluence of SVC.

Normal heart size and mediastinal contours post CABG.

Mild bibasilar atelectasis.

Persistent RIGHT apex pneumothorax, slightly decreased.

No new areas of consolidation, pleural effusion, or LEFT
pneumothorax.
IMPRESSION: Mild bibasilar atelectasis.

Slight decrease in RIGHT apex pneumothorax.

## 2022-10-06 IMAGING — DX DG CHEST 1V PORT
1 series · 1 of 1 positions shown · non-contrast
Comparison: Chest x-ray 10/30/2020.

CLINICAL DATA: Pneumothorax on right.

EXAM:
PORTABLE CHEST 1 VIEW

[chest ap]
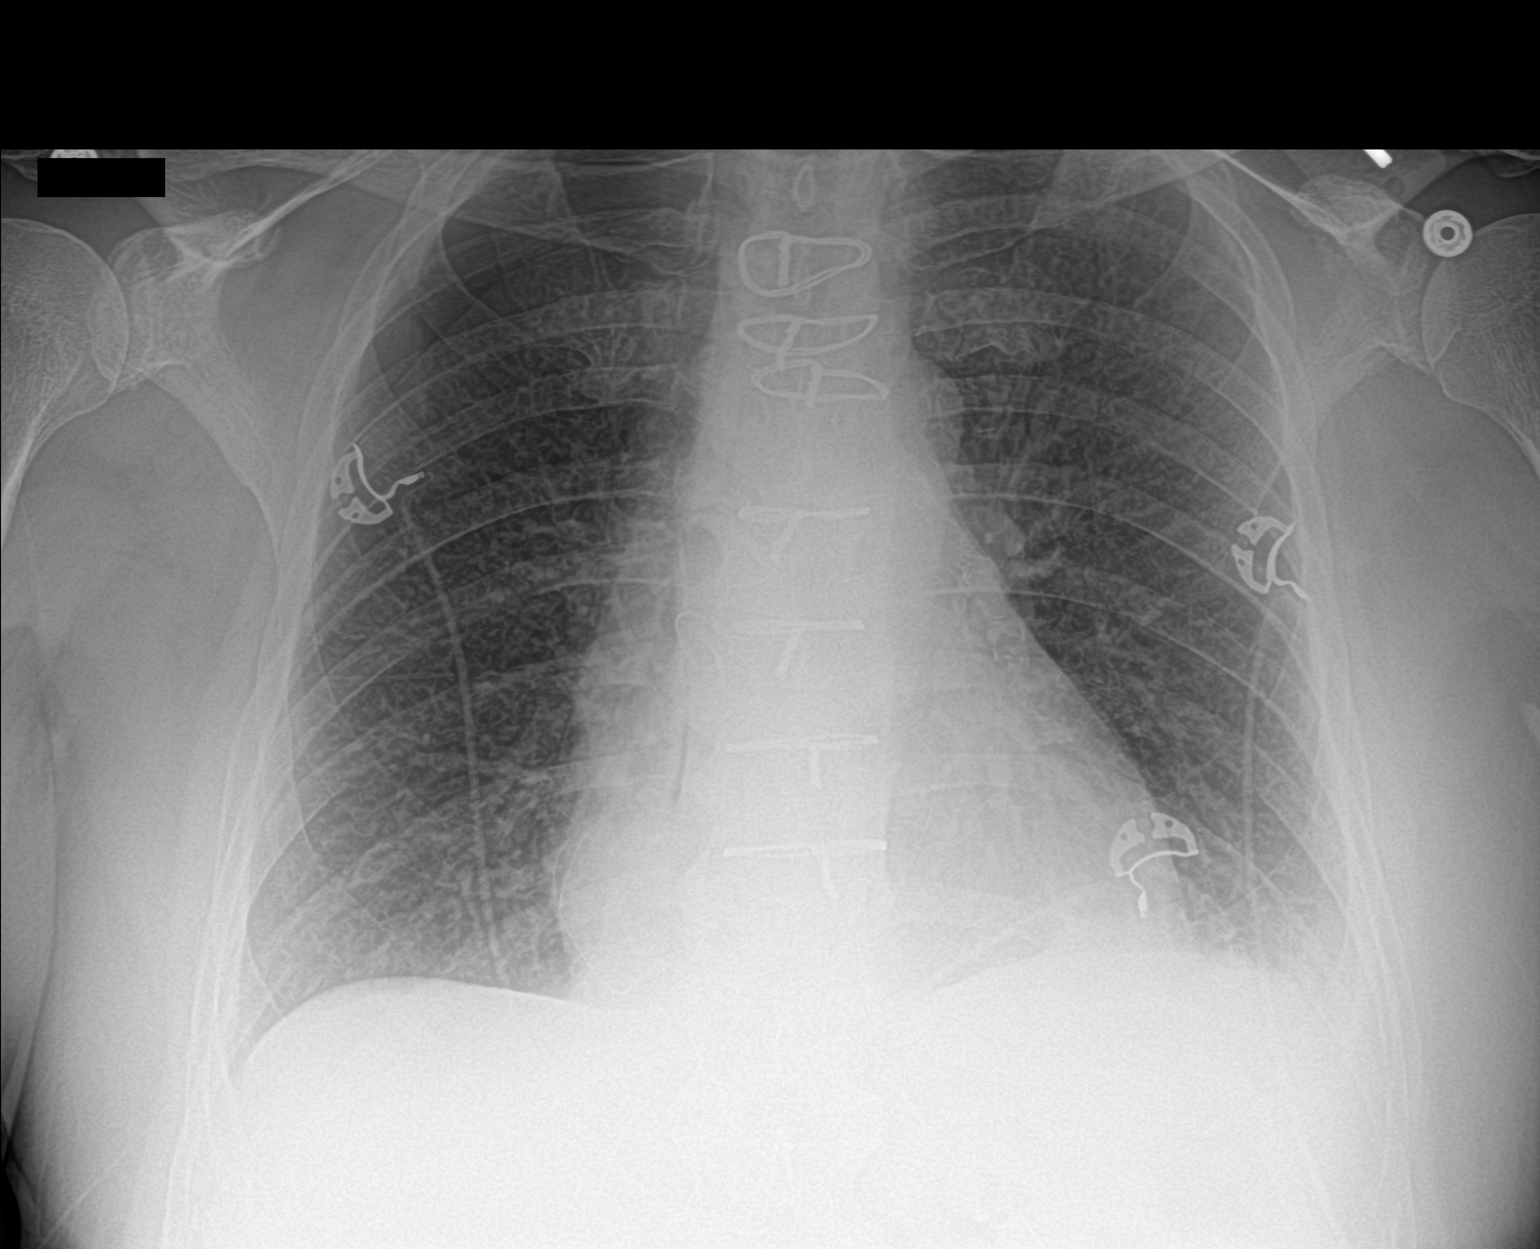

[1 of 1 positions shown; findings below may reference images not displayed]

FINDINGS: Stable right-sided pneumothorax. Prior median sternotomy.
Cardiomegaly. Mild bilateral interstitial prominence and small
bilateral pleural effusions. Findings suggest CHF. Pneumonitis
cannot be excluded.
IMPRESSION: 1. Stable right-sided pneumothorax.
2. Cardiomegaly with mild interstitial prominence and small
bilateral pleural effusions. Findings suggest CHF.

## 2022-10-09 IMAGING — DX DG CHEST 2V
2 series · 2 of 2 positions shown · non-contrast
Comparison: 10/31/2020.

CLINICAL DATA: Shortness of breath.  Recent CABG.

EXAM:
CHEST - 2 VIEW

[dg chest 2 view (1 of 2)]
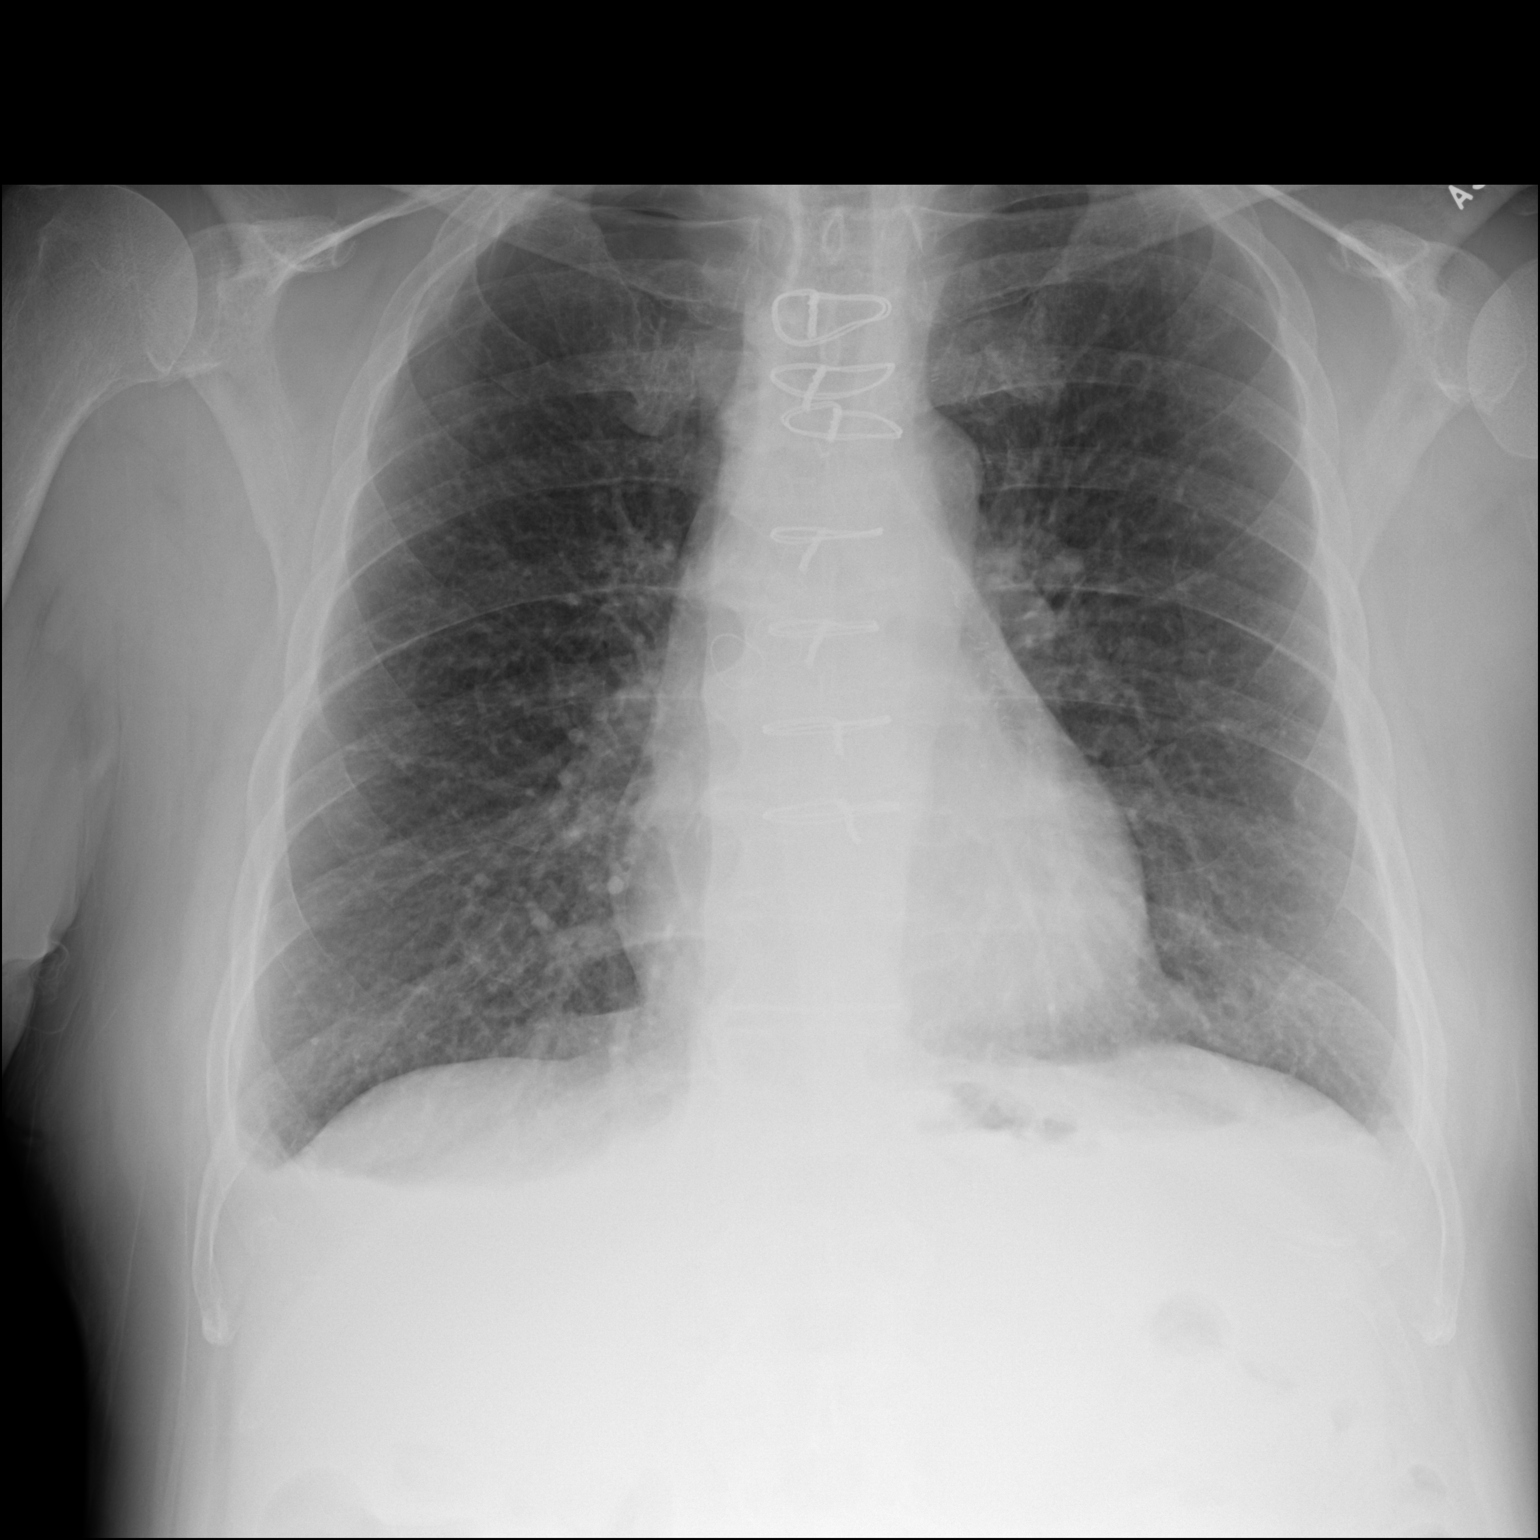

[dg chest 2 view (2 of 2)]
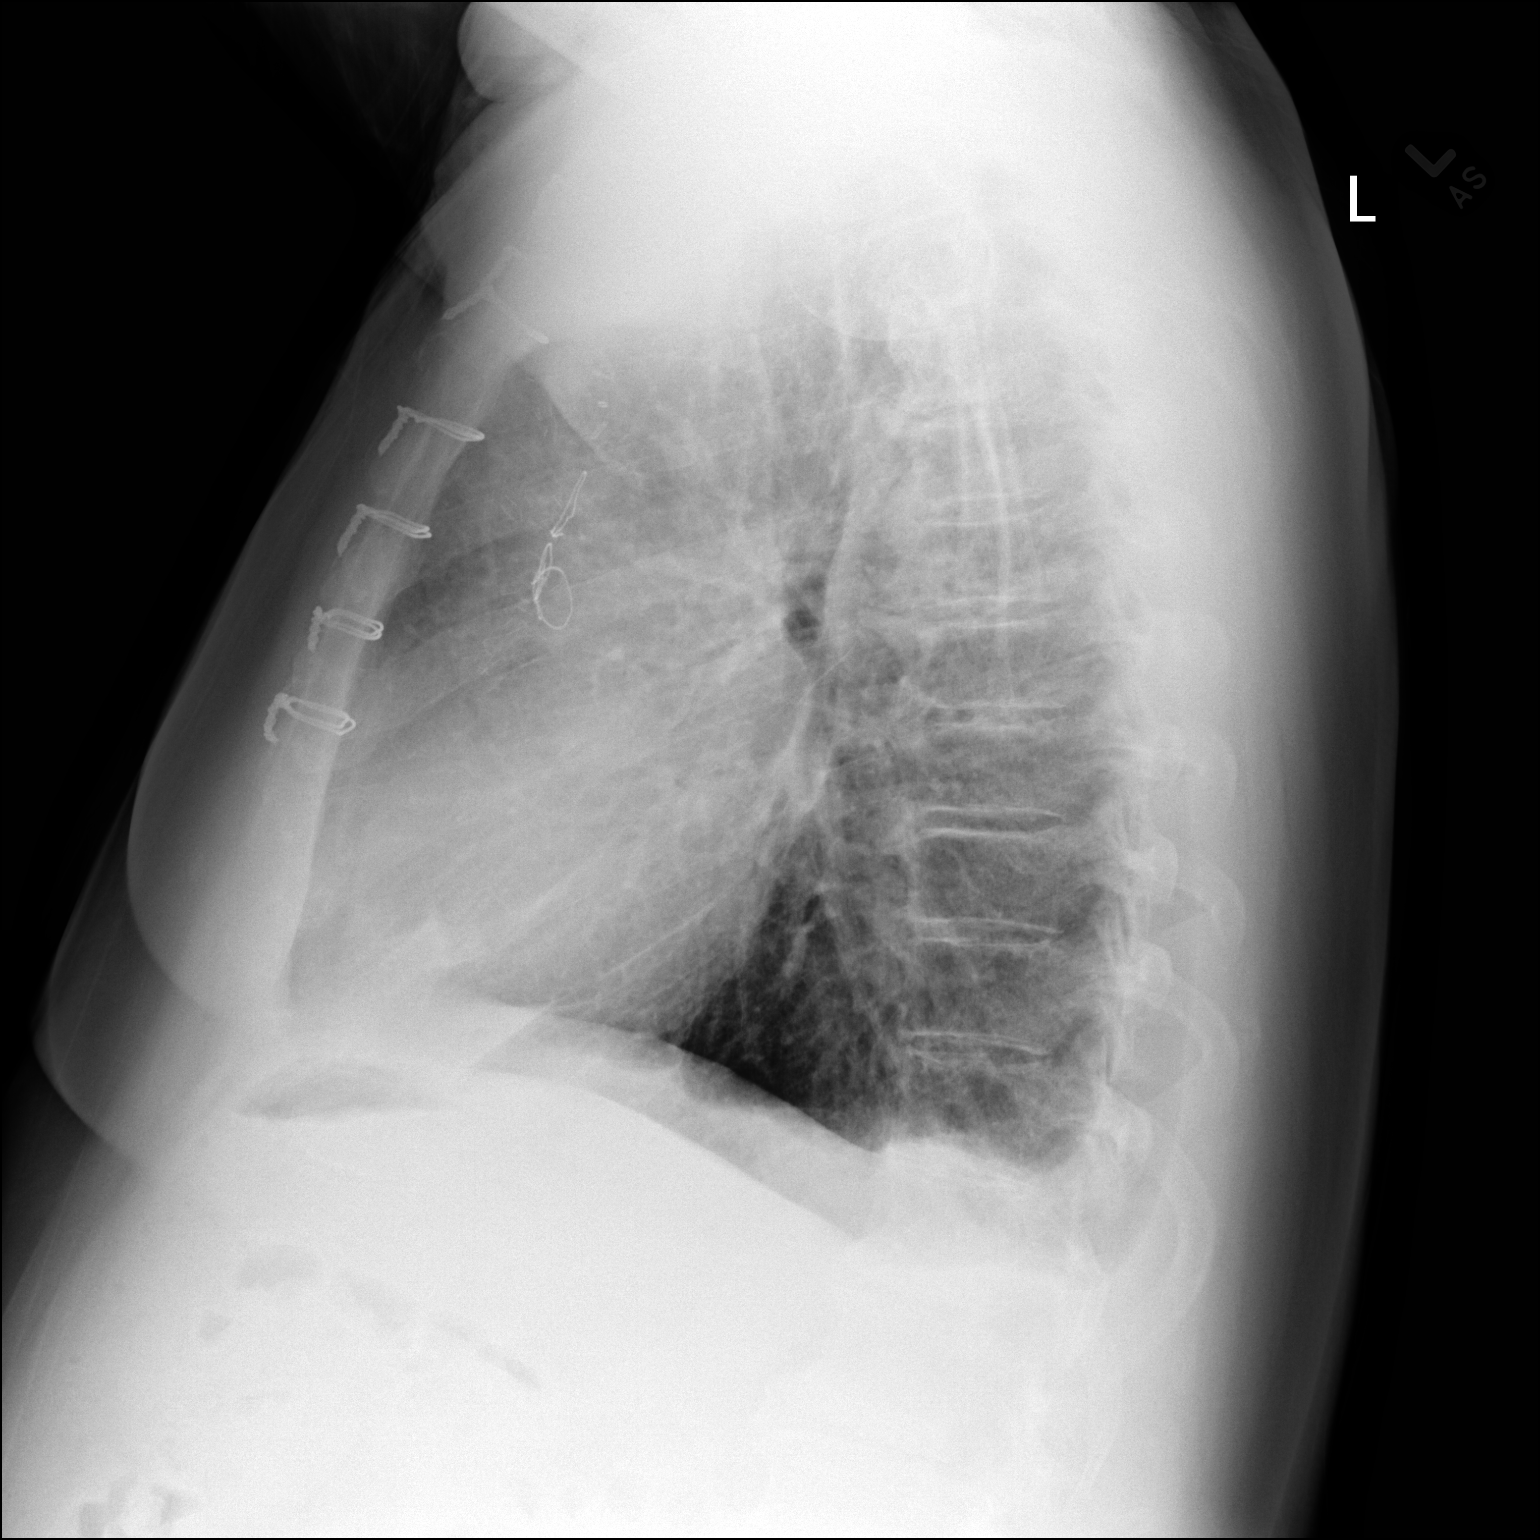

[2 of 2 positions shown; findings below may reference images not displayed]

FINDINGS: Previous median sternotomy and CABG. Small pleural effusion on the
left minimal left base atelectasis. Small pleural effusion on the
right. Small right apical pneumothorax, 5% by estimation, smaller
than was seen 3 days ago.
IMPRESSION: 1. Small bilateral pleural effusions.
2. Small right apical pneumothorax, smaller than seen 3 days ago.

## 2022-10-11 ENCOUNTER — Other Ambulatory Visit: Payer: Self-pay | Admitting: Cardiology

## 2022-11-08 IMAGING — CR DG CHEST 2V
2 series · 2 of 2 positions shown · non-contrast
Comparison: 12/03/2020 and prior radiographs

CLINICAL DATA: 64-year-old male status post recent CABG.

EXAM:
CHEST - 2 VIEW

[w chest pa]
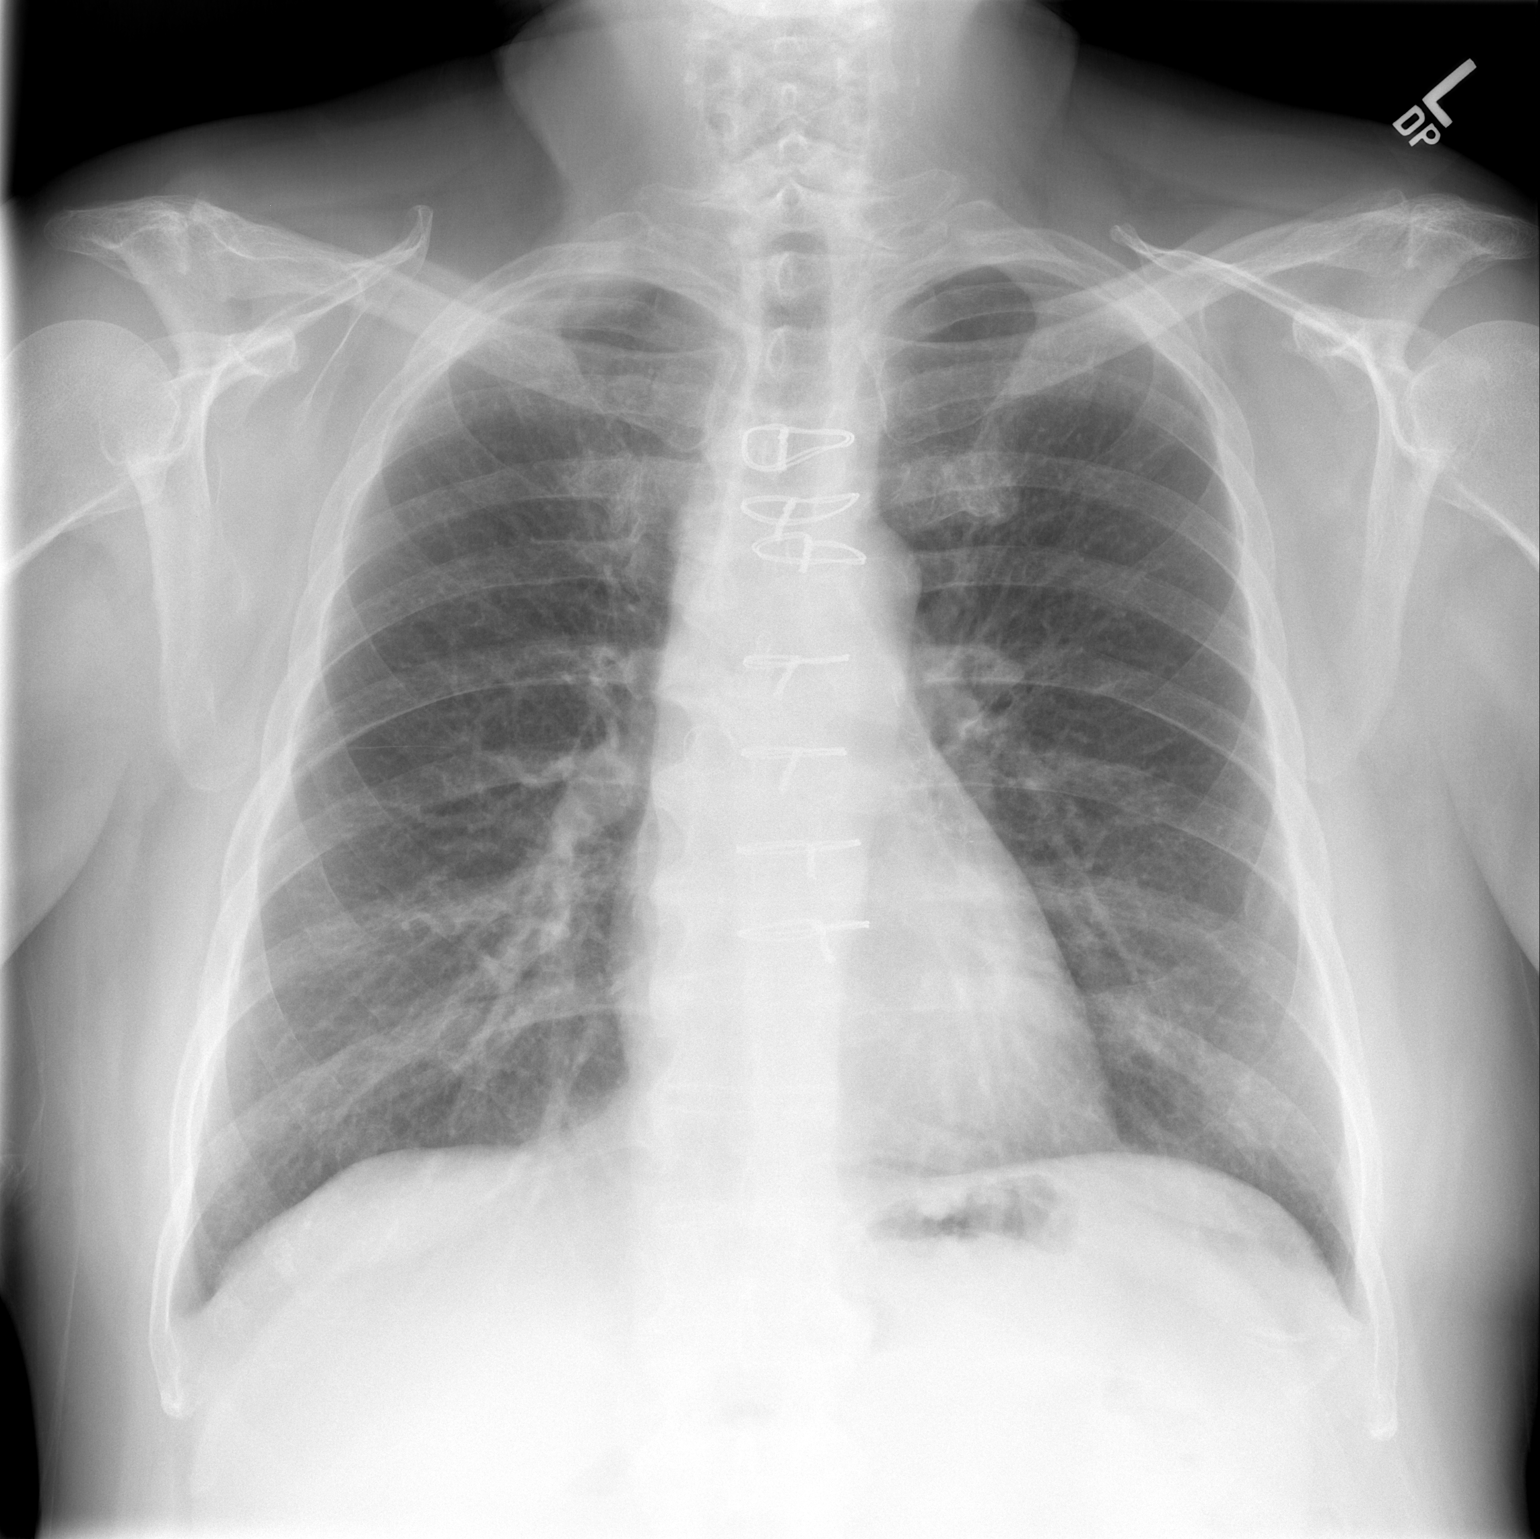

[w chest lat]
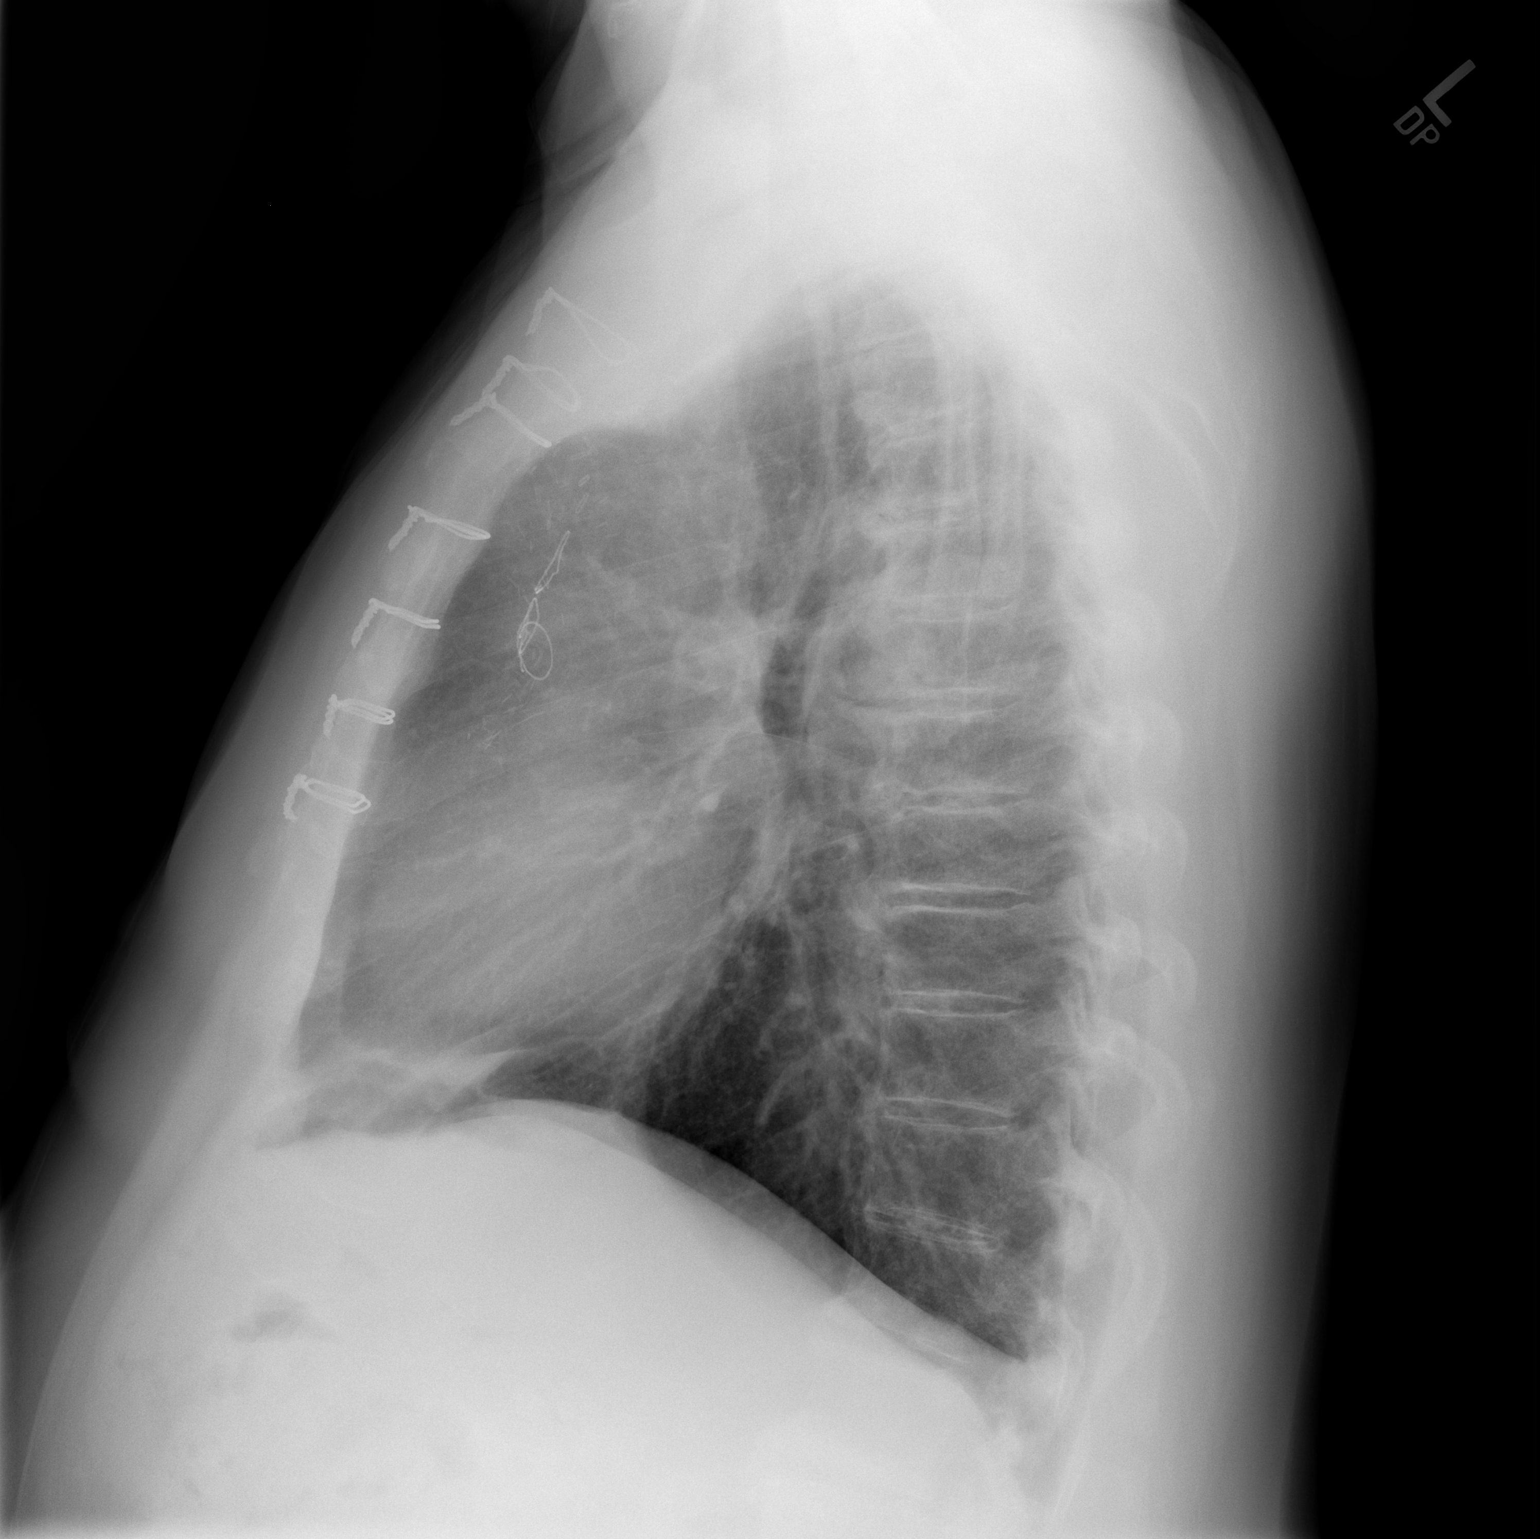

[2 of 2 positions shown; findings below may reference images not displayed]

FINDINGS: CABG changes again noted. Cardiomediastinal silhouette is
unremarkable.

Minimal basilar atelectasis noted.

There is no evidence of focal airspace disease, pulmonary edema,
suspicious pulmonary nodule/mass, pleural effusion, or pneumothorax.

No acute bony abnormalities are identified.
IMPRESSION: CABG changes with minimal basilar atelectasis.  No pneumothorax.

## 2022-11-16 ENCOUNTER — Other Ambulatory Visit: Payer: Self-pay | Admitting: Cardiology

## 2022-11-17 NOTE — Telephone Encounter (Signed)
Refills to pharmacy 

## 2022-11-30 NOTE — Progress Notes (Unsigned)
Cardiology Office Note:    Date:  12/01/2022   ID:  Derek Moore [birth year], MRN [id number]  PCP:  Raina Mina., MD  Cardiologist:  Shirlee More, MD    Referring MD: Raina Mina., MD    ASSESSMENT:    1. PAF (paroxysmal atrial fibrillation) ([place])   2. Presence of Watchman left atrial appendage closure device   3. Coronary artery disease involving native coronary artery of native heart without angina pectoris   4. S/P CABG x 4   5. Type 2 diabetes mellitus without complication, with long-term current use of insulin ([place])   6. Mixed hyperlipidemia   7. Iron deficiency anemia, unspecified iron deficiency anemia type    PLAN:    In order of problems listed above:  Stable maintaining sinus rhythm without antiarrhythmic drug and now has been Watchman device and resolution of his iron deficiency anemia Stable CAD after bypass surgery having no anginal discomfort and continue treatment including his aspirin and high intensity statin Stable diabetes however previously was hypotensive with an SGLT2 inhibitor back on it as of blood pressure less than 100 and I asked him to trend and record blood pressures and if they are staying in this range he will need to stop and have alternate treatment Continue his high intensity statin His hemoglobin is normalized off anticoagulants oral iron repletion   Next appointment: 1 year   Medication Adjustments/Labs and Tests Ordered: Current medicines are reviewed at length with the patient today.  Concerns regarding medicines are outlined above.  No orders of the defined types were placed in this encounter.  No orders of the defined types were placed in this encounter.   Chief complaint follow-up CAD and after Watchman device   History of Present Illness:    Derek Moore is a 67 y.o. male with a hx of Atrial fibrillation with left atrial appendage thrombus chronic anticoagulation iron deficiency anemia CAD and CABG in 2001  last seen 10/19/2021.  He had Watchman device performed  11/12/2021 and his anticoagulation has been discontinued.  His most recent hemoglobin 10/20/2022 was  15.6 with a platelet count 170,000.  Recent labs in November PCP showed cholesterol 153 LDL 85 non-HDL cholesterol 101  Compliance with diet, lifestyle and medications: Yes  He feels well.  Pleased he is not an echo and has had no cardiovascular symptoms of edema chest pain shortness of breath palpitation or syncope He tolerates his statin without muscle pain or weakness He is on single antiplatelet agent low-dose aspirin Past Medical History:  Diagnosis Date   Abnormal EKG 03/09/2018   Acquired hypothyroidism 03/07/2018   Allergy    Aortic atherosclerosis ([place]) 07/17/2020   Formatting of this note might be different from the original. Seen on [facility] 06/2019   Arthritis    Asthma    CAD (coronary artery disease), native coronary artery    Cataract    Chronic gout without tophus 03/07/2018   Coronary artery calcification seen on CAT scan 07/17/2020   Formatting of this note might be different from the original. 06/2019. [facility]   Cough syncope 1999   Dental caries    Depression 03/07/2018   Diplopia 10/11/2018   DISH (diffuse idiopathic skeletal hyperostosis) 07/17/2020   Formatting of this note might be different from the original. Seen on CT [facility] 06/2019   Dizzy 05/09/2018   Gastroesophageal reflux disease without esophagitis 03/07/2018   Formatting of this note might be different from the original. Long term  GERD (gastroesophageal reflux disease) 03/07/2018   Gout 03/07/2018   Hay fever    Heel spur, left 02/07/2020   Hyperlipidemia 03/07/2018   Hypothyroidism (acquired) 03/07/2018   Formatting of this note might be different from the original. 1990s.   Mixed hyperlipidemia 03/07/2018   Obesity    Ocular myasthenia gravis (HCC) 07/17/2020   Formatting of this note might be different from the original. Ocular.  Follows at  [facility].  Sees neurology and eye doctor.   Palpitation 03/08/2018   Plantar fasciitis 02/07/2020   Pneumonia    Presence of Watchman left atrial appendage closure device 11/12/2021   with 28mm Watchman FLX woth Dr. Lalla Brothers   Primary osteoarthritis involving multiple joints 07/17/2020   Recurrent sinusitis    Smokeless tobacco use 07/17/2020   Formatting of this note might be different from the original. Discussed chantix.  He wants to use. Did well in past. Off label, he understands.   Thrombus of left atrial appendage    Tightness of heel cord, left 02/07/2020   Type 2 diabetes mellitus (HCC) 03/07/2018   Type 2 diabetes mellitus without complication, without long-term current use of insulin (HCC) 03/07/2018   Formatting of this note might be different from the original. 2014.    Past Surgical History:  Procedure Laterality Date   CATARACT EXTRACTION W/ INTRAOCULAR LENS  IMPLANT, BILATERAL     COLONOSCOPY W/ POLYPECTOMY  11/27/2012   Colonic polyp, status post polypectomy. Mild sigmoid diverticulosis.   CORONARY ARTERY BYPASS GRAFT N/A 10/27/2020   Procedure: CORONARY ARTERY BYPASS GRAFTING (CABG), ON PUMP, TIMES FOUR, USING LEFT INTERNAL MAMMARY ARTERY AND ENDOSCOPICALLY HARVESTED RIGHT GREATER SAPHENOUS VEIN;  Surgeon: [name], MD;  Location: [facility] OR;  Service: Open Heart Surgery;  Laterality: N/A;   detatched retina     ESOPHAGOGASTRODUODENOSCOPY  04/17/2007   Small hiatal hernia. Mild gastritis. Otherwise normal EGD   kidney stone extraction     cystoscopy x 2   KNEE SURGERY Left    bone spurs   LEFT ATRIAL APPENDAGE OCCLUSION N/A 11/12/2021   Procedure: LEFT ATRIAL APPENDAGE OCCLUSION;  Surgeon: Lanier Prude, MD;  Location: [facility] INVASIVE CV LAB;  Service: Cardiovascular;  Laterality: N/A;   LEFT HEART CATH AND CORONARY ANGIOGRAPHY N/A 10/08/2020   Procedure: LEFT HEART CATH AND CORONARY ANGIOGRAPHY;  Surgeon: Marykay Lex, MD;  Location: [facility] INVASIVE CV LAB;   Service: Cardiovascular;  Laterality: N/A;   NOSE SURGERY     TEE WITHOUT CARDIOVERSION N/A 10/27/2020   Procedure: TRANSESOPHAGEAL ECHOCARDIOGRAM (TEE);  Surgeon: [name], MD;  Location: [facility] OR;  Service: Open Heart Surgery;  Laterality: N/A;   TEE WITHOUT CARDIOVERSION N/A 01/08/2021   Procedure: TRANSESOPHAGEAL ECHOCARDIOGRAM (TEE);  Surgeon: Lars Masson, MD;  Location: [facility] ENDOSCOPY;  Service: Cardiovascular;  Laterality: N/A;   TEE WITHOUT CARDIOVERSION N/A 11/12/2021   Procedure: TRANSESOPHAGEAL ECHOCARDIOGRAM (TEE);  Surgeon: Lanier Prude, MD;  Location: [facility] INVASIVE CV LAB;  Service: Cardiovascular;  Laterality: N/A;   TEE WITHOUT CARDIOVERSION N/A 12/29/2021   Procedure: TRANSESOPHAGEAL ECHOCARDIOGRAM (TEE);  Surgeon: Thurmon Fair, MD;  Location: [facility] ENDOSCOPY;  Service: Cardiovascular;  Laterality: N/A;    Current Medications: Current Meds  Medication Sig   acetaminophen (TYLENOL) 500 MG tablet Take 500-1,000 mg by mouth every 6 (six) hours as needed (for pain.).   albuterol (PROVENTIL) (2.5 MG/3ML) 0.083% nebulizer solution Take 2.5 mg by nebulization every 6 (six) hours as needed for wheezing or shortness of breath.  albuterol (VENTOLIN HFA) 108 (90 Base) MCG/ACT inhaler Inhale into the lungs every 6 (six) hours as needed for wheezing or shortness of breath.   aspirin EC 81 MG tablet Take 1 tablet (81 mg total) by mouth daily.   Dextran 70-Hypromellose, PF, (ARTIFICIAL TEARS PF) 0.1-0.3 % SOLN Place 1 drop into both eyes 3 (three) times daily as needed (dry/irritated eyes.).   ferrous sulfate 325 (65 FE) MG EC tablet Take 325 mg by mouth 3 (three) times a week.   JARDIANCE 10 MG TABS tablet Take 10 mg by mouth daily.   levothyroxine (SYNTHROID) 112 MCG tablet Take 112 mcg by mouth daily before breakfast.   Magnesium Oxide 250 MG TABS Take 250 mg by mouth daily.   metFORMIN (GLUCOPHAGE) 1000 MG tablet Take 1,000 mg by mouth 2 (two) times daily with a meal.    mycophenolate (CELLCEPT) 500 MG tablet Take 1,500 mg by mouth 2 (two) times daily. Take 1 1/2 tablet( 1500 mg) twice daily   Omega-3 Fatty Acids (FISH OIL ULTRA) 1400 MG CAPS Take 1,400 mg by mouth in the morning and at bedtime.   pantoprazole (PROTONIX) 40 MG tablet Take 40 mg by mouth daily.   PARoxetine (PAXIL) 40 MG tablet Take 40 mg by mouth daily.   pyridostigmine (MESTINON) 60 MG tablet Take 90 mg by mouth 4 (four) times daily. Take 1 1/2 tablet (90 mg) 4 times daily   rosuvastatin (CRESTOR) 10 MG tablet TAKE 1 TABLET BY MOUTH DAILY   Semaglutide,0.25 or 0.5MG /DOS, (OZEMPIC, 0.25 OR 0.5 MG/DOSE,) 2 MG/1.5ML SOPN Inject 1 mg into the skin every Wednesday.   vitamin B-12 (CYANOCOBALAMIN) 1000 MCG tablet Take 1,000 mcg by mouth daily.     Allergies:   Codeine, Hydrocodone-acetaminophen, Lipitor [atorvastatin], and Latex   Social History   Socioeconomic History   Marital status: Married    Spouse name: Kim   Number of children: 3   Years of education: Not on file   Highest education level: Not on file  Occupational History   Occupation: retired  Tobacco Use   Smoking status: Former    Types: Software engineer, Pipe    Quit date: 11/30/1999    Years since quitting: 23.0   Smokeless tobacco: Current    Types: Snuff   Tobacco comments:    stopped smoking pipe tobacco 30 years ago  Vaping Use   Vaping Use: Never used  Substance and Sexual Activity   Alcohol use: Yes    Comment: Occasional Beer    Drug use: Never   Sexual activity: Not on file  Other Topics Concern   Not on file  Social History Narrative   Not on file   Social Determinants of Health   Financial Resource Strain: Not on file  Food Insecurity: Not on file  Transportation Needs: Not on file  Physical Activity: Not on file  Stress: Not on file  Social Connections: Not on file     Family History: The patient's family history includes Blindness in his father; CAD in his father; Cancer in his father; Heart Problems in  his mother; Hypertension in his father; Rheum arthritis in his mother. There is no history of Colon cancer, Esophageal cancer, Rectal cancer, or Stomach cancer. ROS:   Please see the history of present illness.    All other systems reviewed and are negative.  EKGs/Labs/Other Studies Reviewed:    The following studies were reviewed today:  EKG:  EKG ordered today and personally reviewed.  The ekg ordered  today demonstrates sinus rhythm repolarization changes  Recent Labs: 12/14/2021: BUN 23; Creatinine, Ser 1.08; Hemoglobin 14.9; Platelets 200; Potassium 4.3; Sodium 138  Recent Lipid Panel    Component Value Date/Time   CHOL 136 06/17/2021 0850   TRIG 106 06/17/2021 0850   HDL 50 06/17/2021 0850   CHOLHDL 2.7 06/17/2021 0850   LDLCALC 67 06/17/2021 0850    Physical Exam:    VS:  BP (!) 90/52 (BP Location: Right Arm, Patient Position: Sitting)   Pulse 65   Ht 5\' 6"  (1.676 m)   Wt 184 lb (83.5 kg)   SpO2 98%   BMI 29.70 kg/m     Wt Readings from Last 3 Encounters:  12/01/22 184 lb (83.5 kg)  05/19/22 180 lb (81.6 kg)  12/29/21 190 lb (86.2 kg)     GEN:  Well nourished, well developed in no acute distress HEENT: Normal NECK: No JVD; No carotid bruits LYMPHATICS: No lymphadenopathy CARDIAC: RRR, no murmurs, rubs, gallops RESPIRATORY:  Clear to auscultation without rales, wheezing or rhonchi  ABDOMEN: Soft, non-tender, non-distended MUSCULOSKELETAL:  No edema; No deformity  SKIN: Warm and dry NEUROLOGIC:  Alert and oriented x 3 PSYCHIATRIC:  Normal affect    Signed, Shirlee More, MD  12/01/2022 9:02 AM    [place]

## 2022-12-01 ENCOUNTER — Encounter: Payer: Self-pay | Admitting: Cardiology

## 2022-12-01 ENCOUNTER — Ambulatory Visit: Payer: Commercial Managed Care - PPO | Attending: Cardiology | Admitting: Cardiology

## 2022-12-01 VITALS — BP 90/52 | HR 65 | Ht 66.0 in | Wt 184.0 lb

## 2022-12-01 DIAGNOSIS — Z95818 Presence of other cardiac implants and grafts: Secondary | ICD-10-CM | POA: Diagnosis not present

## 2022-12-01 DIAGNOSIS — D509 Iron deficiency anemia, unspecified: Secondary | ICD-10-CM

## 2022-12-01 DIAGNOSIS — Z794 Long term (current) use of insulin: Secondary | ICD-10-CM

## 2022-12-01 DIAGNOSIS — I2510 Atherosclerotic heart disease of native coronary artery without angina pectoris: Secondary | ICD-10-CM

## 2022-12-01 DIAGNOSIS — Z951 Presence of aortocoronary bypass graft: Secondary | ICD-10-CM | POA: Diagnosis not present

## 2022-12-01 DIAGNOSIS — E782 Mixed hyperlipidemia: Secondary | ICD-10-CM

## 2022-12-01 DIAGNOSIS — E119 Type 2 diabetes mellitus without complications: Secondary | ICD-10-CM

## 2022-12-01 DIAGNOSIS — I480 Paroxysmal atrial fibrillation: Secondary | ICD-10-CM

## 2022-12-01 NOTE — Patient Instructions (Signed)
Medication Instructions:  Your physician recommends that you continue on your current medications as directed. Please refer to the Current Medication list given to you today.  *If you need a refill on your cardiac medications before your next appointment, please call your pharmacy*   Lab Work: None If you have labs (blood work) drawn today and your tests are completely normal, you will receive your results only by: [place] (if you have MyChart) OR A paper copy in the mail If you have any lab test that is abnormal or we need to change your treatment, we will call you to review the results.   Testing/Procedures: None   Follow-Up: At [facility], you and your health needs are our priority.  As part of our continuing mission to provide you with exceptional heart care, we have created designated Provider Care Teams.  These Care Teams include your primary Cardiologist (physician) and Advanced Practice Providers (APPs -  Physician Assistants and Nurse Practitioners) who all work together to provide you with the care you need, when you need it.  We recommend signing up for the patient portal called "MyChart".  Sign up information is provided on this After Visit Summary.  MyChart is used to connect with patients for Virtual Visits (Telemedicine).  Patients are able to view lab/test results, encounter notes, upcoming appointments, etc.  Non-urgent messages can be sent to your provider as well.   To learn more about what you can do with MyChart, go to [email or web address].    Your next appointment:   1 year  The format for your next appointment:   In Person  Provider:   Shirlee More, MD    Other Instructions If blood pressure is consistently <100 notify Dr. Bettina Gavia.  Important Information About Sugar

## 2023-04-07 ENCOUNTER — Telehealth: Payer: Self-pay | Admitting: Cardiology

## 2023-04-07 NOTE — Telephone Encounter (Signed)
Patient c/o Palpitations:  High priority if patient c/o lightheadedness, shortness of breath, or chest pain  How long have you had palpitations/irregular HR/ Afib? Heart has been skipping beats for the past couple of week off and on. Are you having the symptoms now? No   Are you currently experiencing lightheadedness, SOB or CP? No   Do you have a history of afib (atrial fibrillation) or irregular heart rhythm? Patient has history of a-fib.  Have you checked your BP or HR? (document readings if available): HR 61, has not check his BP.   Are you experiencing any other symptoms? No other symptoms. Patient had a watchman procedure 35yrs ago.

## 2023-04-08 NOTE — Telephone Encounter (Signed)
Patient is returning call. Requesting return call.  

## 2023-04-08 NOTE — Telephone Encounter (Signed)
Left message for the patient to call back.

## 2023-04-12 ENCOUNTER — Other Ambulatory Visit: Payer: Self-pay

## 2023-04-12 ENCOUNTER — Ambulatory Visit: Payer: Commercial Managed Care - PPO | Attending: Cardiology | Admitting: Cardiology

## 2023-04-12 ENCOUNTER — Ambulatory Visit: Payer: Commercial Managed Care - PPO | Attending: Cardiology

## 2023-04-12 VITALS — BP 92/58 | HR 65 | Ht 67.0 in | Wt 177.8 lb

## 2023-04-12 DIAGNOSIS — D509 Iron deficiency anemia, unspecified: Secondary | ICD-10-CM

## 2023-04-12 DIAGNOSIS — I2510 Atherosclerotic heart disease of native coronary artery without angina pectoris: Secondary | ICD-10-CM

## 2023-04-12 DIAGNOSIS — Z95818 Presence of other cardiac implants and grafts: Secondary | ICD-10-CM | POA: Diagnosis not present

## 2023-04-12 DIAGNOSIS — E782 Mixed hyperlipidemia: Secondary | ICD-10-CM

## 2023-04-12 DIAGNOSIS — Z951 Presence of aortocoronary bypass graft: Secondary | ICD-10-CM

## 2023-04-12 DIAGNOSIS — I480 Paroxysmal atrial fibrillation: Secondary | ICD-10-CM

## 2023-04-12 DIAGNOSIS — I513 Intracardiac thrombosis, not elsewhere classified: Secondary | ICD-10-CM | POA: Insufficient documentation

## 2023-04-12 NOTE — Patient Instructions (Signed)
Medication Instructions:  Your physician recommends that you continue on your current medications as directed. Please refer to the Current Medication list given to you today.  *If you need a refill on your cardiac medications before your next appointment, please call your pharmacy*   Lab Work: NONE If you have labs (blood work) drawn today and your tests are completely normal, you will receive your results only by: MyChart Message (if you have MyChart) OR A paper copy in the mail If you have any lab test that is abnormal or we need to change your treatment, we will call you to review the results.   Testing/Procedures: You have been asked to wear a Zio Heart Monitor today. It is to be worn for 7 days. Please remove the monitor on May 21st and mail back in the box provided.  If you have any questions about the monitor please call the company at [phone]     Follow-Up: At [facility], you and your health needs are our priority.  As part of our continuing mission to provide you with exceptional heart care, we have created designated Provider Care Teams.  These Care Teams include your primary Cardiologist (physician) and Advanced Practice Providers (APPs -  Physician Assistants and Nurse Practitioners) who all work together to provide you with the care you need, when you need it.  We recommend signing up for the patient portal called "MyChart".  Sign up information is provided on this After Visit Summary.  MyChart is used to connect with patients for Virtual Visits (Telemedicine).  Patients are able to view lab/test results, encounter notes, upcoming appointments, etc.  Non-urgent messages can be sent to your provider as well.   To learn more about what you can do with MyChart, go to [email or web address].    Your next appointment:  Already has a recall in to schedule appt for December 2024    Provider:   Norman Herrlich, MD    Other Instructions

## 2023-04-12 NOTE — Progress Notes (Signed)
Cardiology Office Note:    Date:  04/12/2023   ID:  Derek Moore, DOB November 03, 1956, MRN [id number]  PCP:  Gordan Payment., MD   [facility] Providers Cardiologist:  Thomasene Ripple, DO Electrophysiologist:  Lanier Prude, MD     Referring MD: Gordan Payment., MD   CC: " heart skipping beats"  History of Present Illness:    Derek Moore is a 67 y.o. male with a hx of CAD s/p CABG x 4 2021, PAF, presence of Watchman device 11/12/2021, thrombus of left atrial appendage, asthma, GERD, DM 2, hypothyroidism, ocular myasthenia gravis, depression, chronic gout, hyperlipidemia, mild carotid artery stenosis, palpitations.  He had a coronary CTA in November 2021 which showed moderate to severe multivessel CAD with probable distal RCA occlusion, L> R collaterals.  FFR demonstrated significant multivessel CAD, including an occluded distal RCA, mid LAD stenosis and significant proximal to mid LCx stenosis.  LHC on 10/08/2020 revealed severe three-vessel disease, EF 45%.  Referred to the TCTS.  Workup for CABG included carotid ultrasounds which revealed mild bilateral stenosis.  He was admitted on 10/27/2020 to 10/31/2020 and underwent CABG x 4 with Dr. Laneta Simmers, LIMA to second diagonal branch of LAD; SVG to diagonal 1; SVG to OM1; SVG to PDA; with endoscopic vein harvest from the right leg.  He developed postoperative atrial fibrillation, started on IV amiodarone, quickly reverted back to normal sinus rhythm.  He wore a monitor in January 2022 which showed atrial fibrillation occurring less than 1% of the time.  He was started on amiodarone, and DOAC.  He continued to have IDA, significant bruising while on DOAC, he establish care with Dr. Lalla Brothers for options to avoid long-term DOAC.  On 11/12/2021 Watchman device was implanted, his Eliquis was disconcerted continued and he was started on Plavix 75 mg daily x 6 months.  Most recently evaluated by Dr. Dulce Sellar on 12/01/2022, at this time he was  doing well from a cardiac perspective, he was maintained in sinus rhythm without antiarrhythmic drug use.  He presents today with recent concern of his heart skipping beats.  He states this has been occurring for the last few weeks but is increased with frequency.  He most often notices it when he is sitting still, he will check his pulse manually and noticed that he has a few irregular beats and then a skipped beat.  He is asymptomatic when this occurs.  He was recently evaluated by his PCP had lab work done, all WNL with the exception of his A1c. He denies chest pain, dyspnea, pnd, orthopnea, n, v, dizziness, syncope, edema, weight gain, or early satiety.    Past Medical History:  Diagnosis Date   Abnormal cardiac CT angiography    Abnormal EKG 03/09/2018   Acquired hypothyroidism 03/07/2018   Allergy    ANA positive 10/02/2020   Formatting of this note might be different from the original.  Homogenous. Other tests negartive. Probably false.  See note 10/02/2020   Aortic atherosclerosis (HCC) 07/17/2020   Formatting of this note might be different from the original. Seen on CT [facility] 06/2019   Arthritis    Asthma    CAD (coronary artery disease), native coronary artery    Cataract    Chronic gout without tophus 03/07/2018   Coronary artery calcification seen on CAT scan 07/17/2020   Formatting of this note might be different from the original. 06/2019. [facility]   Coronary artery disease involving native coronary artery of native heart  without angina pectoris 07/17/2020   Formatting of this note might be different from the original.  Seen on CT [facility] 06/2019     Formatting of this note might be different from the original.  Status post four-vessel bypass     Formatting of this note might be different from the original.  Status post four-vessel bypass 09/2020   Coronary artery disease of native artery of native heart with stable angina pectoris (HCC)    Cough syncope 1999   Dental caries    Depression  03/07/2018   Diplopia 10/11/2018   DISH (diffuse idiopathic skeletal hyperostosis) 07/17/2020   Formatting of this note might be different from the original. Seen on CT [facility] 06/2019   Dizzy 05/09/2018   Gastroesophageal reflux disease without esophagitis 03/07/2018   Formatting of this note might be different from the original. Long term   GERD (gastroesophageal reflux disease) 03/07/2018   Globus sensation 09/22/2022   Gout 03/07/2018   Hay fever    Heel spur, left 02/07/2020   History of asthma 11/06/2020   History of iron deficiency anemia 02/13/2022   History of smokeless tobacco use 07/17/2020   Formatting of this note might be different from the original.  Discussed chantix.  He wants to use. Did well in past. Off label, he understands.     Last Assessment & Plan:   Formatting of this note might be different from the original.  Patient has been without smokeless tobacco for 1 week now   Hyperlipidemia 03/07/2018   Hypomagnesemia 01/29/2021   Hypothyroidism (acquired) 03/07/2018   Formatting of this note might be different from the original. 1990s.   Mild cognitive impairment 07/01/2022   Mild episode of recurrent major depressive disorder (HCC) 07/17/2020   Mixed hyperlipidemia 03/07/2018   Myalgia due to statin 08/05/2021   Obesity    Obesity (BMI 30-39.9) 12/05/2020   Ocular myasthenia gravis (HCC) 07/17/2020   Formatting of this note might be different from the original. Ocular.  Follows at [facility].  Sees neurology and eye doctor.   Orthostatic hypotension 12/19/2020   PAF (paroxysmal atrial fibrillation) (HCC) 11/12/2021   Palpitation 03/08/2018   Plantar fasciitis 02/07/2020   Pneumonia    Presence of Watchman left atrial appendage closure device 11/12/2021   with 24mm Watchman FLX woth Dr. Lalla Brothers   Primary osteoarthritis involving multiple joints 07/17/2020   Recurrent sinusitis    S/P CABG x 4 10/27/2020   Smokeless tobacco use 07/17/2020   Formatting of this note  might be different from the original. Discussed chantix.  He wants to use. Did well in past. Off label, he understands.   Subcutaneous nodules 01/27/2021   Thrombus of left atrial appendage    Tightness of heel cord, left 02/07/2020   Type 2 diabetes mellitus (HCC) 03/07/2018   Type 2 diabetes mellitus without complication, without long-term current use of insulin (HCC) 03/07/2018   Formatting of this note might be different from the original. 2014.   Vitamin B12 deficiency 09/25/2020    Past Surgical History:  Procedure Laterality Date   CATARACT EXTRACTION W/ INTRAOCULAR LENS  IMPLANT, BILATERAL     COLONOSCOPY W/ POLYPECTOMY  11/27/2012   Colonic polyp, status post polypectomy. Mild sigmoid diverticulosis.   CORONARY ARTERY BYPASS GRAFT N/A 10/27/2020   Procedure: CORONARY ARTERY BYPASS GRAFTING (CABG), ON PUMP, TIMES FOUR, USING LEFT INTERNAL MAMMARY ARTERY AND ENDOSCOPICALLY HARVESTED RIGHT GREATER SAPHENOUS VEIN;  Surgeon: [name], MD;  Location: [facility] OR;  Service: Open Heart  Surgery;  Laterality: N/A;   detatched retina     ESOPHAGOGASTRODUODENOSCOPY  04/17/2007   Small hiatal hernia. Mild gastritis. Otherwise normal EGD   kidney stone extraction     cystoscopy x 2   KNEE SURGERY Left    bone spurs   LEFT ATRIAL APPENDAGE OCCLUSION N/A 11/12/2021   Procedure: LEFT ATRIAL APPENDAGE OCCLUSION;  Surgeon: Lanier Prude, MD;  Location: [facility] INVASIVE CV LAB;  Service: Cardiovascular;  Laterality: N/A;   LEFT HEART CATH AND CORONARY ANGIOGRAPHY N/A 10/08/2020   Procedure: LEFT HEART CATH AND CORONARY ANGIOGRAPHY;  Surgeon: Marykay Lex, MD;  Location: [facility] INVASIVE CV LAB;  Service: Cardiovascular;  Laterality: N/A;   NOSE SURGERY     TEE WITHOUT CARDIOVERSION N/A 10/27/2020   Procedure: TRANSESOPHAGEAL ECHOCARDIOGRAM (TEE);  Surgeon: [name], MD;  Location: [facility] OR;  Service: Open Heart Surgery;  Laterality: N/A;   TEE WITHOUT CARDIOVERSION N/A 01/08/2021   Procedure:  TRANSESOPHAGEAL ECHOCARDIOGRAM (TEE);  Surgeon: Lars Masson, MD;  Location: [facility] ENDOSCOPY;  Service: Cardiovascular;  Laterality: N/A;   TEE WITHOUT CARDIOVERSION N/A 11/12/2021   Procedure: TRANSESOPHAGEAL ECHOCARDIOGRAM (TEE);  Surgeon: Lanier Prude, MD;  Location: [facility] INVASIVE CV LAB;  Service: Cardiovascular;  Laterality: N/A;   TEE WITHOUT CARDIOVERSION N/A 12/29/2021   Procedure: TRANSESOPHAGEAL ECHOCARDIOGRAM (TEE);  Surgeon: Thurmon Fair, MD;  Location: [facility] ENDOSCOPY;  Service: Cardiovascular;  Laterality: N/A;    Current Medications: Current Meds  Medication Sig   acetaminophen (TYLENOL) 500 MG tablet Take 500-1,000 mg by mouth every 6 (six) hours as needed (for pain.).   albuterol (PROVENTIL) (2.5 MG/3ML) 0.083% nebulizer solution Take 2.5 mg by nebulization every 6 (six) hours as needed for wheezing or shortness of breath.   albuterol (VENTOLIN HFA) 108 (90 Base) MCG/ACT inhaler Inhale into the lungs every 6 (six) hours as needed for wheezing or shortness of breath.   aspirin EC 81 MG tablet Take 81 mg by mouth daily.   Dextran 70-Hypromellose, PF, (ARTIFICIAL TEARS PF) 0.1-0.3 % SOLN Place 1 drop into both eyes 3 (three) times daily as needed (dry/irritated eyes.).   ferrous sulfate 325 (65 FE) MG EC tablet Take 325 mg by mouth 3 (three) times a week.   JARDIANCE 10 MG TABS tablet Take 10 mg by mouth daily.   levothyroxine (SYNTHROID) 112 MCG tablet Take 112 mcg by mouth daily before breakfast.   Magnesium Oxide 250 MG TABS Take 250 mg by mouth daily.   metFORMIN (GLUCOPHAGE) 1000 MG tablet Take 1,000 mg by mouth 2 (two) times daily with a meal.   mycophenolate (CELLCEPT) 500 MG tablet Take 1,500 mg by mouth 2 (two) times daily.   Omega-3 Fatty Acids (FISH OIL ULTRA) 1400 MG CAPS Take 1,400 mg by mouth in the morning and at bedtime.   pantoprazole (PROTONIX) 40 MG tablet Take 40 mg by mouth daily.   PARoxetine (PAXIL) 40 MG tablet Take 40 mg by mouth daily.    pyridostigmine (MESTINON) 60 MG tablet Take 90 mg by mouth 4 (four) times daily. Take 1 1/2 tablet (90 mg) 4 times daily   rosuvastatin (CRESTOR) 10 MG tablet TAKE 1 TABLET BY MOUTH DAILY   Semaglutide,0.25 or 0.5MG /DOS, (OZEMPIC, 0.25 OR 0.5 MG/DOSE,) 2 MG/1.5ML SOPN Inject 1 mg into the skin every Wednesday.   vitamin B-12 (CYANOCOBALAMIN) 1000 MCG tablet Take 1,000 mcg by mouth daily.     Allergies:   Codeine, Hydrocodone-acetaminophen, Lipitor [atorvastatin], and Latex   Social History  Socioeconomic History   Marital status: Married    Spouse name: Selena Batten   Number of children: 3   Years of education: Not on file   Highest education level: Not on file  Occupational History   Occupation: retired  Tobacco Use   Smoking status: Former    Types: Software engineer, Pipe    Quit date: 11/30/1999    Years since quitting: 23.3   Smokeless tobacco: Current    Types: Snuff   Tobacco comments:    stopped smoking pipe tobacco 30 years ago  Vaping Use   Vaping Use: Never used  Substance and Sexual Activity   Alcohol use: Yes    Comment: Occasional Beer    Drug use: Never   Sexual activity: Not on file  Other Topics Concern   Not on file  Social History Narrative   Not on file   Social Determinants of Health   Financial Resource Strain: Not on file  Food Insecurity: Not on file  Transportation Needs: Not on file  Physical Activity: Not on file  Stress: Not on file  Social Connections: Not on file     Family History: The patient's family history includes Blindness in his father; CAD in his father; Cancer in his father; Heart Problems in his mother; Hypertension in his father; Rheum arthritis in his mother. There is no history of Colon cancer, Esophageal cancer, Rectal cancer, or Stomach cancer.  ROS:   Please see the history of present illness.     All other systems reviewed and are negative.  EKGs/Labs/Other Studies Reviewed:    The following studies were reviewed today: Cardiac  Studies & Procedures   CARDIAC CATHETERIZATION  CARDIAC CATHETERIZATION 10/08/2020  Narrative  There is mild left ventricular systolic dysfunction.  LV end diastolic pressure is normal.  There is no aortic valve stenosis.  Prox RCA lesion is 95% stenosed. Prox RCA to Mid RCA lesion is 50% stenosed. Mid RCA lesion is 70% stenosed. Mid RCA to Dist RCA lesion is 100% stenosed. (Fills via left-right collaterals from LCx)  Ost LM to Ost LAD lesion is 20% stenosed with 30% stenosed side branch in Ost Cx.  Ost LAD to Prox LAD lesion is 30% stenosed.  Mid Cx lesion is 90% stenosed with 90% stenosed side branch in 1st Mrg.  Mid Cx to Dist Cx lesion is 70% stenosed with 95% stenosed side branch in 2nd Mrg.  Prox LAD to Mid LAD lesion is 70% stenosed with 60% stenosed side branch in 1st Diag.  SUMMARY  Severe three-vessel disease:  Extensive proximal to mid 90 having 70 and 80% stenosis followed by 100% CTO (fills via left to right collaterals from AVG LCx);  LEFT MAIN diffuse 10 to 20% stenosis, 20% ostial LCx with 90% focal concentric stenosis at OM1 (1st Mrg) followed by a second 70% stenosis at a very small caliber OM 2,  Segmental calcified 40% proximal LAD with DFR positive long segment of 60-65% at the takeoff of D1 (DFR was 0.85, CT FFR was 0.79).  LAD after D1 (1st Diag) bifurcates more distally into tandem vessels of D2 and basely the septal LAD-is relatively small caliber  Mildly reduced LVEF with mild global hypokinesis EF roughly 45% by visual estimate.  Low LVEDP   RECOMMENDATION  He is relatively asymptomatic, recommend outpatient CVTS consultation -> need to consider the benefits of full revascularization with CABG versus PCI of the bifurcation LCx-OM1 lesion and bifurcation LAD-D1 lesion.  No antiplatelet agent besides aspirin with planned  CVTS consultation  Continue aggressive GUIDELINE DIRECTED MEDICAL THERAPY  Follow-up with Dr. Dulce Sellar   That way Bryan Lemma, MD  Findings Coronary Findings Diagnostic  Dominance: Right  Left Main Ost LM to Ost LAD lesion is 20% stenosed with 30% stenosed side branch in Ost Cx. The lesion is mildly calcified.  Left Anterior Descending Vessel is small. Ost LAD to Prox LAD lesion is 30% stenosed. The lesion is segmental. The lesion is calcified. Prox LAD to Mid LAD lesion is 70% stenosed with 60% stenosed side branch in 1st Diag. The lesion is located at the bifurcation, eccentric, concentric and smooth. CTA FFR  First Diagonal Branch Vessel is small in size.  First Septal Branch Vessel is small in size.  Ramus Intermedius Vessel is small.  Left Circumflex Vessel is moderate in size. Mid Cx lesion is 90% stenosed with 90% stenosed side branch in 1st Mrg. The lesion is located at the bifurcation. Mid Cx to Dist Cx lesion is 70% stenosed with 95% stenosed side branch in 2nd Mrg. The lesion is focal and concentric.  First Obtuse Marginal Branch Vessel is small in size.  Second Obtuse Marginal Branch Vessel is small in size.  First Left Posterolateral Branch Vessel is small in size.  Second Left Posterolateral Branch Vessel is small in size.  Left Posterior Atrioventricular Artery Vessel is large in size.  Right Coronary Artery Prox RCA lesion is 95% stenosed. The lesion is segmental and eccentric. Prox RCA to Mid RCA lesion is 50% stenosed. The lesion is segmental and irregular. Mid RCA lesion is 70% stenosed. The lesion is focal and concentric. Mid RCA to Dist RCA lesion is 100% stenosed. The lesion is chronically occluded.  Acute Marginal Branch Vessel is small in size.  Right Ventricular Branch Vessel is small in size.  Right Posterior Atrioventricular Artery Collaterals RPAV filled by collaterals from LPAV.  First Right Posterolateral Branch Vessel is small in size.  Second Right Posterolateral Branch Vessel is moderate in size.  Intervention  No interventions have  been documented.   STRESS TESTS  ECHOCARDIOGRAM STRESS TEST 05/01/2018  Narrative *[facility]* [address] [place], [place] [address] [phone]  ------------------------------------------------------------------- Stress Echocardiography  Patient:    Derek Moore, Derek Moore MR #:       [id number] Study Date: 05/01/2018 Gender:     M Age:        70 Height:     167.6 cm Weight:     101.7 kg BSA:        2.22 m^2 Pt. Status: Room:  ATTENDING    Norman Herrlich, MD ORDERING     Norman Herrlich, MD REFERRING    Norman Herrlich, MD PERFORMING   [facility] SONOGRAPHER  Jimmy Reel, RDCS  cc:  -------------------------------------------------------------------  ------------------------------------------------------------------- Indications:      Abnormal EKG 794.31.  Palpitations 785.1.  ------------------------------------------------------------------- History:   Risk factors:  Diabetes mellitus. Dyslipidemia.  ------------------------------------------------------------------- Study Conclusions  - Stress ECG conclusions: There were no stress arrhythmias or conduction abnormalities. The stress ECG was negative for ischemia. - Staged echo: There was no echocardiographic evidence for stress-induced ischemia.  Impressions:  - 1. Stress echo negative for evidence of inducible ischemia. 2. Normal left ventricular systolic function at rest and good augmentation of the left ventricular segments at stress 3. Normal exercise capacity.  ------------------------------------------------------------------- Study data:   Study status:  Routine.  Consent:  The risks, benefits, and alternatives to the procedure were explained to the  patient and informed consent was obtained.  Procedure:  The patient reported no pain pre or post test. Initial setup. The patient was brought to the laboratory. A baseline ECG was recorded. Surface ECG leads and automatic cuff blood  pressure measurements were monitored. Treadmill exercise testing was performed using the Bruce protocol. The patient exercised for 8 min 1 sec, to protocol stage 3, to a maximal work rate of 10.1 mets. Exercise was terminated due to moderate fatigue. The patient was positioned for image acquisition and recovery monitoring. Transthoracic stress echocardiography. Images were captured at baseline and peak exercise.  Study completion:  The patient tolerated the procedure well. There were no complications.          Bruce protocol. Stress echocardiography.  Birthdate:  Patient birthdate: 21-Apr-1956.  Age: Patient is 67 yr old.  Sex:  Gender: male.    BMI: 36.2 kg/m^2. Blood pressure:     100/66  Patient status:  Outpatient.  Study date:  Study date: 05/01/2018. Study time: 10:17 AM.  -------------------------------------------------------------------  ------------------------------------------------------------------- Baseline ECG:  Normal.  ------------------------------------------------------------------- Stress protocol:  +--------+---------------+---+------------+--------+ !Stage   !Time into phase!HR !BP (mmHg)   !Symptoms! +--------+---------------+---+------------+--------+ !Baseline!---------------!68 !116/77 (90) !None    ! +--------+---------------+---+------------+--------+ !Stage 1 !---------------!88 !153/72 (99) !--------! +--------+---------------+---+------------+--------+ !Stage 2 !---------------!100!177/71 (106)!--------! +--------+---------------+---+------------+--------+ !Stage 3 !---------------!125!------------!--------! +--------+---------------+---+------------+--------+ !Recovery!3 min 20 sec   !66 !167/65 (99) !--------! +--------+---------------+---+------------+--------+  ------------------------------------------------------------------- Stress results:   Maximal heart rate during stress was 125 bpm (80% of maximal predicted heart rate). The maximal  predicted heart rate was 157 bpm.The target heart rate was achieved. The heart rate response to stress was normal. There was a normal resting blood pressure with an appropriate response to stress.  The patient experienced no chest pain during stress.  ------------------------------------------------------------------- Stress ECG:  There were no stress arrhythmias or conduction abnormalities.  The stress ECG was negative for ischemia.  ------------------------------------------------------------------- Baseline:  - The estimated LV ejection fraction was 60%. - Normal wall motion; no LV regional wall motion abnormalities.  Peak stress:  - The estimated LV ejection fraction was 70%. - Normal wall motion; no LV regional wall motion abnormalities.  ------------------------------------------------------------------- Stress echo results:     Left ventricular ejection fraction was normal at rest and with stress. There was no echocardiographic evidence for stress-induced ischemia.  ------------------------------------------------------------------- Measurements  Left ventricle                           Value        Reference LV ID, ED, PLAX chordal                  48    mm     43 - 52 LV ID, ES, PLAX chordal        (H)       39.8  mm     23 - 38 LV fx shortening, PLAX chordal (L)       17    %      >=29 LV PW thickness, ED                      12.7  mm     --------- IVS/LV PW ratio, ED                      0.96         <=1.3  Ventricular septum  Value        Reference IVS thickness, ED                        12.2  mm     ---------  LVOT                                     Value        Reference LVOT ID, S                               22    mm     --------- LVOT area                                3.8   cm^2   ---------  Aorta                                    Value        Reference Aortic root ID, ED                       34    mm     ---------  Left  atrium                              Value        Reference LA ID, A-P, ES                           39    mm     --------- LA ID/bsa, A-P                           1.76  cm/m^2 <=2.2  Legend: (L)  and  (H)  mark values outside specified reference range.  ------------------------------------------------------------------- Prepared and Electronically Authenticated by  Belva Crome, MD 2019-06-03T14:10:12   ECHOCARDIOGRAM  ECHOCARDIOGRAM COMPLETE 11/12/2020  Narrative ECHOCARDIOGRAM REPORT    Patient Name:   DAARON CASTANOS Date of Exam: 11/12/2020 Medical Rec #:  [id number]         Height:       66.0 in Accession #:    [id number]        Weight:       202.0 lb Date of Birth:  20-Feb-1956        BSA:          2.008 m Patient Age:    63 years          BP:           108/56 mmHg Patient Gender: M                 HR:           60 bpm. Exam Location:  [facility]  Procedure: 2D Echo  Indications:    PAF (paroxysmal atrial fibrillation) (HCC) [I48.0 (ICD-10-CM)]  History:        Patient has prior history of Echocardiogram examinations, most recent 10/27/2020. Prior CABG, Arrythmias:Abnormal EKG; Risk Factors:Diabetes and Dyslipidemia.  Sonographer:  Kaleal Reel Referring Phys: [id number] BRIAN J MUNLEY  IMPRESSIONS   1. Left ventricular ejection fraction, by estimation, is 45 to 50%. The left ventricle has mildly decreased function. The left ventricle has global hypokinesis. There is mild concentric left ventricular hypertrophy. 2. The left ventricular global longitudinal strain is abnormal (-10.3 %). 3. Right ventricular systolic function is normal. The right ventricular size is normal. There is normal pulmonary artery systolic pressure. 4. The mitral valve is normal in structure. No evidence of mitral valve regurgitation. No evidence of mitral stenosis. 5. The aortic valve is normal in structure. Aortic valve regurgitation is not visualized. No aortic stenosis is present. 6. The  inferior vena cava is normal in size with greater than 50% respiratory variability, suggesting right atrial pressure of 3 mmHg.  FINDINGS Left Ventricle: Left ventricular ejection fraction, by estimation, is 45 to 50%. The left ventricle has mildly decreased function. The left ventricle demonstrates global hypokinesis. The average left ventricular global longitudinal strain is -10.3 %. The global longitudinal strain is abnormal. The left ventricular internal cavity size was normal in size. There is mild concentric left ventricular hypertrophy. Left ventricular diastolic parameters are consistent with Grade I diastolic dysfunction (impaired relaxation).  Right Ventricle: The right ventricular size is normal. No increase in right ventricular wall thickness. Right ventricular systolic function is normal. There is normal pulmonary artery systolic pressure. The tricuspid regurgitant velocity is 2.29 m/s, and with an assumed right atrial pressure of 3 mmHg, the estimated right ventricular systolic pressure is 24.0 mmHg.  Left Atrium: Left atrial size was normal in size.  Right Atrium: Right atrial size was normal in size.  Pericardium: There is no evidence of pericardial effusion. Presence of pericardial fat pad.  Mitral Valve: The mitral valve is normal in structure. No evidence of mitral valve regurgitation. No evidence of mitral valve stenosis.  Tricuspid Valve: The tricuspid valve is normal in structure. Tricuspid valve regurgitation is not demonstrated. No evidence of tricuspid stenosis.  Aortic Valve: The aortic valve is normal in structure. Aortic valve regurgitation is not visualized. No aortic stenosis is present.  Pulmonic Valve: The pulmonic valve was normal in structure. Pulmonic valve regurgitation is not visualized. No evidence of pulmonic stenosis.  Aorta: The aortic root is normal in size and structure.  Venous: The inferior vena cava is normal in size with greater than 50%  respiratory variability, suggesting right atrial pressure of 3 mmHg.  IAS/Shunts: No atrial level shunt detected by color flow Doppler.   LEFT VENTRICLE PLAX 2D LVIDd:         4.90 cm     Diastology LVIDs:         3.50 cm     LV e' medial:    5.98 cm/s LV PW:         1.20 cm     LV E/e' medial:  14.4 LV IVS:        1.20 cm     LV e' lateral:   7.29 cm/s LVOT diam:     2.10 cm     LV E/e' lateral: 11.8 LV SV:         71 LV SV Index:   35          2D Longitudinal Strain LVOT Area:     3.46 cm    2D Strain GLS Avg:     -10.3 %  LV Volumes (MOD) LV vol d, MOD A2C: 52.9 ml LV vol d, MOD A4C: 61.3 ml  LV vol s, MOD A2C: 27.5 ml LV vol s, MOD A4C: 32.3 ml LV SV MOD A2C:     25.4 ml LV SV MOD A4C:     61.3 ml LV SV MOD BP:      28.0 ml  RIGHT VENTRICLE            IVC RV S prime:     8.92 cm/s  IVC diam: 1.50 cm TAPSE (M-mode): 1.7 cm  LEFT ATRIUM             Index       RIGHT ATRIUM           Index LA diam:        4.10 cm 2.04 cm/m  RA Area:     12.10 cm LA Vol (A2C):   46.7 ml 23.25 ml/m RA Volume:   27.90 ml  13.89 ml/m LA Vol (A4C):   38.8 ml 19.32 ml/m LA Biplane Vol: 42.8 ml 21.31 ml/m AORTIC VALVE LVOT Vmax:   104.00 cm/s LVOT Vmean:  68.800 cm/s LVOT VTI:    0.205 m  AORTA Ao Root diam: 3.30 cm Ao Asc diam:  3.10 cm  MITRAL VALVE                TRICUSPID VALVE MV Area (PHT): 2.37 cm     TR Peak grad:   21.0 mmHg MV Decel Time: 320 msec     TR Vmax:        229.00 cm/s MV E velocity: 85.90 cm/s MV A velocity: 109.00 cm/s  SHUNTS MV E/A ratio:  0.79         Systemic VTI:  0.20 m Systemic Diam: 2.10 cm  Kardie Tobb DO Electronically signed by Thomasene Ripple DO Signature Date/Time: 11/12/2020/1:00:17 PM    Final   TEE  ECHO TEE 12/29/2021  Narrative TRANSESOPHOGEAL ECHO REPORT    Patient Name:   Derek Moore Date of Exam: 12/29/2021 Medical Rec #:  [id number]         Height:       66.0 in Accession #:    [id number]        Weight:       190.0  lb Date of Birth:  [birth year]        BSA:          1.957 m Patient Age:    65 years          BP:           102/66 mmHg Patient Gender: M                 HR:           57 bpm. Exam Location:  Inpatient  Procedure: 2D Echo, Color Doppler and Cardiac Doppler  Indications:     Watchman Follow up  History:         Patient has prior history of Echocardiogram examinations, most recent 11/12/2021. Prior CABG, Arrythmias:Atrial Fibrillation; Risk Factors:Diabetes and Dyslipidemia. 24mm Watchman Device implanted 11/12/21.  Sonographer:     Irving Burton Senior RDCS Referring Phys:  [id number] Lanier Prude Diagnosing Phys: Thurmon Fair MD  PROCEDURE: After discussion of the risks and benefits of a TEE, an informed consent was obtained from the patient. The transesophogeal probe was passed without difficulty through the esophogus of the patient. Local oropharyngeal anesthetic was provided with Cetacaine. Sedation performed by different physician. The patient was monitored while under deep sedation. Anesthestetic sedation was provided intravenously  by Anesthesiology: 146mg  of Propofol, 60mg  of Lidocaine. The patient's vital signs; including heart rate, blood pressure, and oxygen saturation; remained stable throughout the procedure. The patient developed no complications during the procedure.  IMPRESSIONS   1. Left ventricular ejection fraction, by estimation, is 60 to 65%. The left ventricle has normal function. The left ventricle has no regional wall motion abnormalities. 2. Right ventricular systolic function is normal. The right ventricular size is normal. 3. Well seated Watchman device with appropriate compression and no leak. There is a tiny iatrogenic ASD with left to right shunt. No left atrial/left atrial appendage thrombus was detected. 4. The mitral valve is normal in structure. No evidence of mitral valve regurgitation. No evidence of mitral stenosis. 5. The aortic valve is normal in  structure. Aortic valve regurgitation is not visualized. No aortic stenosis is present. 6. There is Moderate (Grade III) protruding plaque involving the descending aorta. 7. The inferior vena cava is normal in size with greater than 50% respiratory variability, suggesting right atrial pressure of 3 mmHg. 8. Evidence of atrial level shunting detected by color flow Doppler.  Conclusion(s)/Recommendation(s): Normal biventricular function without evidence of hemodynamically significant valvular heart disease.  FINDINGS Left Ventricle: Left ventricular ejection fraction, by estimation, is 60 to 65%. The left ventricle has normal function. The left ventricle has no regional wall motion abnormalities. The left ventricular internal cavity size was normal in size. There is no left ventricular hypertrophy.  Right Ventricle: The right ventricular size is normal. No increase in right ventricular wall thickness. Right ventricular systolic function is normal.  Left Atrium: Well seated Watchman device with appropriate compression and no leak. There is a tiny iatrogenic ASD with left to right shunt. Left atrial size was normal in size. No left atrial/left atrial appendage thrombus was detected.  Right Atrium: Right atrial size was normal in size.  Pericardium: There is no evidence of pericardial effusion.  Mitral Valve: The mitral valve is normal in structure. No evidence of mitral valve regurgitation. No evidence of mitral valve stenosis.  Tricuspid Valve: The tricuspid valve is normal in structure. Tricuspid valve regurgitation is not demonstrated. No evidence of tricuspid stenosis.  Aortic Valve: The aortic valve is normal in structure. Aortic valve regurgitation is not visualized. No aortic stenosis is present.  Pulmonic Valve: The pulmonic valve was normal in structure. Pulmonic valve regurgitation is not visualized. No evidence of pulmonic stenosis.  Aorta: The aortic root is normal in size and  structure. There is moderate (Grade III) protruding plaque involving the descending aorta.  Venous: The inferior vena cava is normal in size with greater than 50% respiratory variability, suggesting right atrial pressure of 3 mmHg.  IAS/Shunts: Evidence of atrial level shunting detected by color flow Doppler.  Thurmon Fair MD Electronically signed by Thurmon Fair MD Signature Date/Time: 12/29/2021/9:22:23 AM    Final   MONITORS  LONG TERM MONITOR (3-14 DAYS) 12/09/2020  Narrative A Zio monitor was used for 6 days beginning 11/11/2020 to assess atrial fibrillation.  The predominant rhythm is sinus with average minimum and maximum heart rates of 66, 50 and 98 bpm.  There were no pauses of 3 seconds or greater no episodes of second or third-degree AV nodal block or sinus node exit block.  Trial fibrillation was present less than 1% burden average heart rate of 111 bpm.  Ventricular ectopy is rare with isolated PVCs and couplets.  Supraventricular ectopy was rare with brief atrial fibrillation longest episode 2 minutes and 3 seconds.  There is 1 triggered event associated with a single PVC   Conclusion paroxysmal atrial fibrillation with a low burden less than 1%, the monitor was addressed during his visit yesterday in the office.   CT SCANS  CT CORONARY MORPH W/CTA COR W/SCORE 09/29/2020  Addendum 09/29/2020 10:12 PM ADDENDUM REPORT: 09/29/2020 22:09  HISTORY: 67 yo male with ECG abnormal, 36yr CHD risk 10-20%, not treadmill candidate Chest pain/anginal equiv  EXAM: Cardiac/Coronary CTA  TECHNIQUE: The patient was scanned on a [employer].  PROTOCOL: A 120 kV prospective scan was triggered in the descending thoracic aorta at 111 HU's. Axial non-contrast 3 mm slices were carried out through the heart. The data set was analyzed on a dedicated work station and scored using the Agatson method. Gantry rotation speed was 250 msecs and collimation was .6 mm.  Beta blockade and 0.8 mg of sl NTG was given. The 3D data set was reconstructed in 5% intervals of the 67-82 % of the R-R cycle. Diastolic phases were analyzed on a dedicated work station using MPR, MIP and VRT modes. The patient received 80mL OMNIPAQUE IOHEXOL 350 MG/ML SOLN of contrast.  FINDINGS: Quality: Very good, HR 49  Coronary calcium score: The patient's coronary artery calcium score is 739, which places the patient in the 88th percentile.  Coronary arteries: Normal coronary origins.  Right dominance.  Right Coronary Artery: Dominant with diffuse calcification. Moderate to severe proximal mixed plaque 50-69% stenosis (CADRADS3). Mild mixed 24-49% mid-vessel stenosis (CADRADS2). Distal RCA appears 100% occluded (CADRADS5). There appears to be left to right collaterals from the LCx to R-PDA.  Left Main Coronary Artery: Minimal ostial non-calcified stenosis 1-24% (CADRADS1). Bifurcates into the LAD and LCx arteries.  Left Anterior Descending Coronary Artery: Anterior vessel which is calcified. Ostial moderate to severe mixed 50-69% stenosis (CADRADS3). Mid vessel moderate to severe mixed stenosis (50-69%), just prior to a small D1 (<2.0 mm branch) bifurcation. The distal LAD bifurcates in a pitchfork fashion without significant disease.  Left Circumflex Artery: AV groove vessel. Minimal 1-24% non-calcified ostial stenosis (CADRADS1). Mild 25-49% mixed plaque which appears positively remodeled (CADRADS2V). Moderate sized proximal OM1 and smaller OM2 without disease.  Aorta: Normal size, 31 mm at the mid ascending aorta (level of the PA bifurcation) measured double oblique. No calcifications. No dissection.  Aortic Valve: Trileaflet.  No calcifications.  Other findings:  Normal pulmonary vein drainage into the left atrium.  Normal left atrial appendage without a thrombus.  Normal size of the pulmonary artery.  IMPRESSION: 1. Moderate to severe multivessel CAD with  probable distal RCA occlusion with left to right collaterals, CADRADS = 5. CT FFR will be performed and reported separately.  2. Coronary calcium score of 739. This was 88th percentile for age and sex matched control.  3. Normal coronary origin with right dominance.  4. Definitive cardiac catheterization is recommended.   Electronically Signed By: Chrystie Nose M.D. On: 09/29/2020 22:09  Narrative EXAM: OVER-READ INTERPRETATION  CT CHEST  The following report is an over-read performed by radiologist Dr. Trudie Reed of [facility] on 09/29/2020. This over-read does not include interpretation of cardiac or coronary anatomy or pathology. The coronary calcium score/coronary CTA interpretation by the cardiologist is attached.  COMPARISON:  None.  FINDINGS: Within the visualized portions of the thorax there are no suspicious appearing pulmonary nodules or masses, there is no acute consolidative airspace disease, no pleural effusions, no pneumothorax and no lymphadenopathy. Visualized portions of the upper abdomen are unremarkable. There  are no aggressive appearing lytic or blastic lesions noted in the visualized portions of the skeleton.  IMPRESSION: 1. No significant incidental noncardiac findings are noted.  Electronically Signed: By: Trudie Reed M.D. On: 09/29/2020 09:08           EKG:  EKG is  ordered today.  The ekg ordered today demonstrates NSR, HR 65 bpm, abnormal EKG but consistent with prior tracings.   Recent Labs: No results found for requested labs within last 365 days.  Recent Lipid Panel    Component Value Date/Time   CHOL 136 06/17/2021 0850   TRIG 106 06/17/2021 0850   HDL 50 06/17/2021 0850   CHOLHDL 2.7 06/17/2021 0850   LDLCALC 67 06/17/2021 0850     Risk Assessment/Calculations:    CHA2DS2-VASc Score = 4   This indicates a 4.8% annual risk of stroke. The patient's score is based upon: CHF History: 0 HTN History:  1 Diabetes History: 1 Stroke History: 0 Vascular Disease History: 1 Age Score: 1 Gender Score: 0               Physical Exam:    VS:  BP (!) 92/58   Pulse 65   Ht 5\' 7"  (1.702 m)   Wt 177 lb 12.8 oz (80.6 kg)   SpO2 96%   BMI 27.85 kg/m     Wt Readings from Last 3 Encounters:  04/12/23 177 lb 12.8 oz (80.6 kg)  12/01/22 184 lb (83.5 kg)  05/19/22 180 lb (81.6 kg)     GEN:  Well nourished, well developed in no acute distress HEENT: Normal NECK: No JVD; No carotid bruits LYMPHATICS: No lymphadenopathy CARDIAC: RRR, no murmurs, rubs, gallops RESPIRATORY:  Clear to auscultation without rales, wheezing or rhonchi  ABDOMEN: Soft, non-tender, non-distended MUSCULOSKELETAL:  No edema; No deformity  SKIN: Warm and dry NEUROLOGIC:  Alert and oriented x 3 PSYCHIATRIC:  Normal affect   ASSESSMENT:    1. PAF (paroxysmal atrial fibrillation) (HCC)   2. Presence of Watchman left atrial appendage closure device   3. Coronary artery disease involving native coronary artery of native heart without angina pectoris   4. S/P CABG x 4   5. Iron deficiency anemia, unspecified iron deficiency anemia type   6. Mixed hyperlipidemia    PLAN:    In order of problems listed above:  PAF/presence of Watchman device-s/p watchman implantation in 2022, today he reports he feels like his heart has been skipping beats, sounds to be most consistent with PAC versus PVC.  Will have him wear a 7-day monitor. Palpitations-this a bit more noticeable for him over the last few weeks.  He does not have an Scientist, physiological or Kardia mobile device.  Will arrange for 7-day monitor.  Recent lab work completed by his PCP without any contributing factors for palpitations. CAD-s/p CABG x 4 in 2021, Stable with no anginal symptoms. No indication for ischemic evaluation.  Continue aspirin 81 mg daily, continue Crestor 10 mg daily.  Was not placed on a beta-blocker after CABG secondary to episodes of  bradycardia. Hyperlipidemia-this is managed by his PCP, continue Crestor 10 mg daily.  Disposition-7-day ZIO monitor.  56-month follow-up, sooner if needed.         Medication Adjustments/Labs and Tests Ordered: Current medicines are reviewed at length with the patient today.  Concerns regarding medicines are outlined above.  Orders Placed This Encounter  Procedures   LONG TERM MONITOR (3-14 DAYS)   EKG 12-Lead   No orders  of the defined types were placed in this encounter.   Patient Instructions  Medication Instructions:  Your physician recommends that you continue on your current medications as directed. Please refer to the Current Medication list given to you today.  *If you need a refill on your cardiac medications before your next appointment, please call your pharmacy*   Lab Work: NONE If you have labs (blood work) drawn today and your tests are completely normal, you will receive your results only by: MyChart Message (if you have MyChart) OR A paper copy in the mail If you have any lab test that is abnormal or we need to change your treatment, we will call you to review the results.   Testing/Procedures: You have been asked to wear a Zio Heart Monitor today. It is to be worn for 7 days. Please remove the monitor on May 21st and mail back in the box provided.  If you have any questions about the monitor please call the company at [phone]     Follow-Up: At [facility], you and your health needs are our priority.  As part of our continuing mission to provide you with exceptional heart care, we have created designated Provider Care Teams.  These Care Teams include your primary Cardiologist (physician) and Advanced Practice Providers (APPs -  Physician Assistants and Nurse Practitioners) who all work together to provide you with the care you need, when you need it.  We recommend signing up for the patient portal called "MyChart".  Sign up information is  provided on this After Visit Summary.  MyChart is used to connect with patients for Virtual Visits (Telemedicine).  Patients are able to view lab/test results, encounter notes, upcoming appointments, etc.  Non-urgent messages can be sent to your provider as well.   To learn more about what you can do with MyChart, go to [email or web address].    Your next appointment:  Already has a recall in to schedule appt for December 2024    Provider:   Norman Herrlich, MD    Other Instructions     Signed, Flossie Dibble, NP  04/12/2023 10:42 AM    [facility]

## 2023-04-12 NOTE — Telephone Encounter (Signed)
Left message for the patient to call back.

## 2023-04-14 NOTE — Telephone Encounter (Signed)
Left message for the patient to call back.

## 2023-04-15 NOTE — Telephone Encounter (Signed)
Left message for the patient to call back.

## 2023-04-19 NOTE — Telephone Encounter (Signed)
Called patient and he stated that he had already came by the office and scheduled an appointment to see Dr. Dulce Sellar. Patient had no further questions at this time.

## 2023-07-11 ENCOUNTER — Ambulatory Visit: Payer: Commercial Managed Care - PPO | Attending: Cardiology | Admitting: Cardiology

## 2023-07-11 ENCOUNTER — Encounter: Payer: Self-pay | Admitting: Cardiology

## 2023-07-11 VITALS — BP 94/59 | HR 50 | Ht 66.0 in | Wt 169.2 lb

## 2023-07-11 DIAGNOSIS — I2510 Atherosclerotic heart disease of native coronary artery without angina pectoris: Secondary | ICD-10-CM | POA: Diagnosis not present

## 2023-07-11 DIAGNOSIS — D509 Iron deficiency anemia, unspecified: Secondary | ICD-10-CM

## 2023-07-11 DIAGNOSIS — I480 Paroxysmal atrial fibrillation: Secondary | ICD-10-CM

## 2023-07-11 DIAGNOSIS — Z951 Presence of aortocoronary bypass graft: Secondary | ICD-10-CM

## 2023-07-11 DIAGNOSIS — Z95818 Presence of other cardiac implants and grafts: Secondary | ICD-10-CM

## 2023-07-11 DIAGNOSIS — I959 Hypotension, unspecified: Secondary | ICD-10-CM | POA: Diagnosis not present

## 2023-07-11 DIAGNOSIS — E782 Mixed hyperlipidemia: Secondary | ICD-10-CM

## 2023-07-11 MED ORDER — MIDODRINE HCL 5 MG PO TABS: 5.0000 mg | ORAL_TABLET | Freq: Three times a day (TID) | ORAL | 3 refills | Status: DC

## 2023-07-11 MED ORDER — FLUDROCORTISONE ACETATE 0.1 MG PO TABS: 0.1000 mg | ORAL_TABLET | Freq: Every day | ORAL | 3 refills | Status: DC

## 2023-07-11 NOTE — Patient Instructions (Signed)
Medication Instructions:  Your physician has recommended you make the following change in your medication:   START: Midodrine 5 mg three times daily START: Florinef 0.1 mg daily  *If you need a refill on your cardiac medications before your next appointment, please call your pharmacy*   Lab Work: Your physician recommends that you return for lab work in:   Labs: Fasting am cortisol, BMP  If you have labs (blood work) drawn today and your tests are completely normal, you will receive your results only by: MyChart Message (if you have MyChart) OR A paper copy in the mail If you have any lab test that is abnormal or we need to change your treatment, we will call you to review the results.   Testing/Procedures: None   Follow-Up: At [facility], you and your health needs are our priority.  As part of our continuing mission to provide you with exceptional heart care, we have created designated Provider Care Teams.  These Care Teams include your primary Cardiologist (physician) and Advanced Practice Providers (APPs -  Physician Assistants and Nurse Practitioners) who all work together to provide you with the care you need, when you need it.  We recommend signing up for the patient portal called "MyChart".  Sign up information is provided on this After Visit Summary.  MyChart is used to connect with patients for Virtual Visits (Telemedicine).  Patients are able to view lab/test results, encounter notes, upcoming appointments, etc.  Non-urgent messages can be sent to your provider as well.   To learn more about what you can do with MyChart, go to [email or web address].    Your next appointment:   6 week(s)  Provider:   Norman Herrlich, MD    Other Instructions Check blood pressures for 2 weeks both sitting and standing twice a day. After 2 weeks drop off a list of the blood pressures to the office.

## 2023-07-11 NOTE — Progress Notes (Signed)
Cardiology Office Note:    Date:  07/11/2023   ID:  Derek Moore [birth year], MRN [id number]  PCP:  Gordan Payment., MD  Cardiologist:  Norman Herrlich, MD    Referring MD: Gordan Payment., MD    ASSESSMENT:    1. Hypotension, unspecified hypotension type   2. PAF (paroxysmal atrial fibrillation) (HCC)   3. Presence of Watchman left atrial appendage closure device   4. Coronary artery disease involving native coronary artery of native heart without angina pectoris   5. S/P CABG x 4   6. Mixed hyperlipidemia   7. Iron deficiency anemia, unspecified iron deficiency anemia type    PLAN:    In order of problems listed above:  This clinical problem is chronic symptomatic hypotension in the setting of diabetes cholinergic agent and SGLT2 inhibitor To mitigate  symptoms start Florinef as well as midodrine and screen for adrenal insufficiency with steroid usage intermittently fasting cortisol in the morning Stable not having clinical recurrence he has a Watchman device and is not anticoagulated Stable CAD having no anginal discomfort continue medical therapy including aspirin high intensity statin Is send a list of blood pressures sitting and standing in 2 weeks may require increased dosage midodrine   Next appointment: 6 weeks   Medication Adjustments/Labs and Tests Ordered: Current medicines are reviewed at length with the patient today.  Concerns regarding medicines are outlined above.  No orders of the defined types were placed in this encounter.  No orders of the defined types were placed in this encounter.    History of Present Illness:    Derek Moore is a 67 y.o. male with a hx of atrial fibrillation and left atrial appendage thrombus subsequent Watchman device December 2022 iron deficiency anemia CAD remote CABG 2001 and hyperlipidemia last seen by me 12/01/2022.  Compliance with diet, lifestyle and medications: Yes  Unfortunately continues to have  symptomatic hypotension He takes a cholinergic agent for his myasthenia gravis and takes an SGLT2 inhibitor for diabetes that can reduce his blood volume He chronically gets blood pressures in the range of 90 systolic sitting in his low was 70 standing with chronic lightheadedness and exertional intolerance Recently got up at night was sick to his stomach vomited had some diarrhea and fainted multiple times in the bathroom More recent monitor showing no significant bradycardia He request that we do something to blunt these episodes He is not having edema chest pain or shortness of breath He gets some intermittent palpitation and a home device shows occasional PVCs Past Medical History:  Diagnosis Date   Abnormal cardiac CT angiography    Abnormal EKG 03/09/2018   Acquired hypothyroidism 03/07/2018   Allergy    ANA positive 10/02/2020   Formatting of this note might be different from the original.  Homogenous. Other tests negartive. Probably false.  See note 10/02/2020   Aortic atherosclerosis (HCC) 07/17/2020   Formatting of this note might be different from the original. Seen on CT [facility] 06/2019   Arthritis    Asthma    CAD (coronary artery disease), native coronary artery    Cataract    Chronic gout without tophus 03/07/2018   Coronary artery calcification seen on CAT scan 07/17/2020   Formatting of this note might be different from the original. 06/2019. [facility]   Coronary artery disease involving native coronary artery of native heart without angina pectoris 07/17/2020   Formatting of this note might be different from the original.  Seen on  CT [facility] 06/2019     Formatting of this note might be different from the original.  Status post four-vessel bypass     Formatting of this note might be different from the original.  Status post four-vessel bypass 09/2020   Coronary artery disease of native artery of native heart with stable angina pectoris (HCC)    Cough syncope 1999   Dental caries     Depression 03/07/2018   Diplopia 10/11/2018   DISH (diffuse idiopathic skeletal hyperostosis) 07/17/2020   Formatting of this note might be different from the original. Seen on CT [facility] 06/2019   Dizzy 05/09/2018   Gastroesophageal reflux disease without esophagitis 03/07/2018   Formatting of this note might be different from the original. Long term   GERD (gastroesophageal reflux disease) 03/07/2018   Globus sensation 09/22/2022   Gout 03/07/2018   Hay fever    Heel spur, left 02/07/2020   History of asthma 11/06/2020   History of iron deficiency anemia 02/13/2022   History of smokeless tobacco use 07/17/2020   Formatting of this note might be different from the original.  Discussed chantix.  He wants to use. Did well in past. Off label, he understands.     Last Assessment & Plan:   Formatting of this note might be different from the original.  Patient has been without smokeless tobacco for 1 week now   Hyperlipidemia 03/07/2018   Hypomagnesemia 01/29/2021   Hypothyroidism (acquired) 03/07/2018   Formatting of this note might be different from the original. 1990s.   Mild cognitive impairment 07/01/2022   Mild episode of recurrent major depressive disorder (HCC) 07/17/2020   Mixed hyperlipidemia 03/07/2018   Myalgia due to statin 08/05/2021   Obesity    Obesity (BMI 30-39.9) 12/05/2020   Ocular myasthenia gravis (HCC) 07/17/2020   Formatting of this note might be different from the original. Ocular.  Follows at [facility].  Sees neurology and eye doctor.   Orthostatic hypotension 12/19/2020   PAF (paroxysmal atrial fibrillation) (HCC) 11/12/2021   Palpitation 03/08/2018   Plantar fasciitis 02/07/2020   Pneumonia    Presence of Watchman left atrial appendage closure device 11/12/2021   with 24mm Watchman FLX woth Dr. Lalla Brothers   Primary osteoarthritis involving multiple joints 07/17/2020   Recurrent sinusitis    S/P CABG x 4 10/27/2020   Smokeless tobacco use 07/17/2020   Formatting of  this note might be different from the original. Discussed chantix.  He wants to use. Did well in past. Off label, he understands.   Subcutaneous nodules 01/27/2021   Thrombus of left atrial appendage    Tightness of heel cord, left 02/07/2020   Type 2 diabetes mellitus (HCC) 03/07/2018   Type 2 diabetes mellitus without complication, without long-term current use of insulin (HCC) 03/07/2018   Formatting of this note might be different from the original. 2014.   Vitamin B12 deficiency 09/25/2020    Current Medications: Current Meds  Medication Sig   acetaminophen (TYLENOL) 500 MG tablet Take 500-1,000 mg by mouth every 6 (six) hours as needed (for pain.).   albuterol (PROVENTIL) (2.5 MG/3ML) 0.083% nebulizer solution Take 2.5 mg by nebulization every 6 (six) hours as needed for wheezing or shortness of breath.   albuterol (VENTOLIN HFA) 108 (90 Base) MCG/ACT inhaler Inhale into the lungs every 6 (six) hours as needed for wheezing or shortness of breath.   aspirin EC 81 MG tablet Take 81 mg by mouth daily.   Dextran 70-Hypromellose, PF, (ARTIFICIAL TEARS PF) 0.1-0.3 %  SOLN Place 1 drop into both eyes 3 (three) times daily as needed (dry/irritated eyes.).   ferrous sulfate 325 (65 FE) MG EC tablet Take 325 mg by mouth 3 (three) times a week.   JARDIANCE 10 MG TABS tablet Take 10 mg by mouth daily.   levothyroxine (SYNTHROID) 125 MCG tablet Take 125 mcg by mouth daily.   Magnesium Oxide 250 MG TABS Take 250 mg by mouth daily.   metFORMIN (GLUCOPHAGE) 1000 MG tablet Take 1,000 mg by mouth 2 (two) times daily with a meal.   mycophenolate (CELLCEPT) 500 MG tablet Take 1,500 mg by mouth 2 (two) times daily.   Omega-3 Fatty Acids (FISH OIL ULTRA) 1400 MG CAPS Take 1,400 mg by mouth in the morning and at bedtime.   OZEMPIC, 1 MG/DOSE, 4 MG/3ML SOPN Inject 1 mg into the skin once a week.   pantoprazole (PROTONIX) 40 MG tablet Take 40 mg by mouth daily.   PARoxetine (PAXIL) 40 MG tablet Take 40 mg by  mouth daily.   pyridostigmine (MESTINON) 60 MG tablet Take 90 mg by mouth 4 (four) times daily. Take 1 1/2 tablet (90 mg) 4 times daily   rosuvastatin (CRESTOR) 10 MG tablet TAKE 1 TABLET BY MOUTH DAILY   vitamin B-12 (CYANOCOBALAMIN) 1000 MCG tablet Take 1,000 mcg by mouth daily.      EKGs/Labs/Other Studies Reviewed:    The following studies were reviewed today:  Cardiac Studies & Procedures   CARDIAC CATHETERIZATION  CARDIAC CATHETERIZATION 10/08/2020  Narrative  There is mild left ventricular systolic dysfunction.  LV end diastolic pressure is normal.  There is no aortic valve stenosis.  Prox RCA lesion is 95% stenosed. Prox RCA to Mid RCA lesion is 50% stenosed. Mid RCA lesion is 70% stenosed. Mid RCA to Dist RCA lesion is 100% stenosed. (Fills via left-right collaterals from LCx)  Ost LM to Ost LAD lesion is 20% stenosed with 30% stenosed side branch in Ost Cx.  Ost LAD to Prox LAD lesion is 30% stenosed.  Mid Cx lesion is 90% stenosed with 90% stenosed side branch in 1st Mrg.  Mid Cx to Dist Cx lesion is 70% stenosed with 95% stenosed side branch in 2nd Mrg.  Prox LAD to Mid LAD lesion is 70% stenosed with 60% stenosed side branch in 1st Diag.  SUMMARY  Severe three-vessel disease:  Extensive proximal to mid 90 having 70 and 80% stenosis followed by 100% CTO (fills via left to right collaterals from AVG LCx);  LEFT MAIN diffuse 10 to 20% stenosis, 20% ostial LCx with 90% focal concentric stenosis at OM1 (1st Mrg) followed by a second 70% stenosis at a very small caliber OM 2,  Segmental calcified 40% proximal LAD with DFR positive long segment of 60-65% at the takeoff of D1 (DFR was 0.85, CT FFR was 0.79).  LAD after D1 (1st Diag) bifurcates more distally into tandem vessels of D2 and basely the septal LAD-is relatively small caliber  Mildly reduced LVEF with mild global hypokinesis EF roughly 45% by visual estimate.  Low LVEDP   RECOMMENDATION  He is  relatively asymptomatic, recommend outpatient CVTS consultation -> need to consider the benefits of full revascularization with CABG versus PCI of the bifurcation LCx-OM1 lesion and bifurcation LAD-D1 lesion.  No antiplatelet agent besides aspirin with planned CVTS consultation  Continue aggressive GUIDELINE DIRECTED MEDICAL THERAPY  Follow-up with Dr. Dulce Sellar   That way Bryan Lemma, MD  Findings Coronary Findings Diagnostic  Dominance: Right  Left Main Suezanne Jacquet  LM to Ost LAD lesion is 20% stenosed with 30% stenosed side branch in Ost Cx. The lesion is mildly calcified.  Left Anterior Descending Vessel is small. Ost LAD to Prox LAD lesion is 30% stenosed. The lesion is segmental. The lesion is calcified. Prox LAD to Mid LAD lesion is 70% stenosed with 60% stenosed side branch in 1st Diag. The lesion is located at the bifurcation, eccentric, concentric and smooth. CTA FFR  First Diagonal Branch Vessel is small in size.  First Septal Branch Vessel is small in size.  Ramus Intermedius Vessel is small.  Left Circumflex Vessel is moderate in size. Mid Cx lesion is 90% stenosed with 90% stenosed side branch in 1st Mrg. The lesion is located at the bifurcation. Mid Cx to Dist Cx lesion is 70% stenosed with 95% stenosed side branch in 2nd Mrg. The lesion is focal and concentric.  First Obtuse Marginal Branch Vessel is small in size.  Second Obtuse Marginal Branch Vessel is small in size.  First Left Posterolateral Branch Vessel is small in size.  Second Left Posterolateral Branch Vessel is small in size.  Left Posterior Atrioventricular Artery Vessel is large in size.  Right Coronary Artery Prox RCA lesion is 95% stenosed. The lesion is segmental and eccentric. Prox RCA to Mid RCA lesion is 50% stenosed. The lesion is segmental and irregular. Mid RCA lesion is 70% stenosed. The lesion is focal and concentric. Mid RCA to Dist RCA lesion is 100% stenosed. The lesion is  chronically occluded.  Acute Marginal Branch Vessel is small in size.  Right Ventricular Branch Vessel is small in size.  Right Posterior Atrioventricular Artery Collaterals RPAV filled by collaterals from LPAV.  First Right Posterolateral Branch Vessel is small in size.  Second Right Posterolateral Branch Vessel is moderate in size.  Intervention  No interventions have been documented.   STRESS TESTS  ECHOCARDIOGRAM STRESS TEST 05/01/2018  Narrative *[facility]* [address] [place], [place] [address] [phone]  ------------------------------------------------------------------- Stress Echocardiography  Patient:    Keaten, Lira MR #:       [id number] Study Date: 05/01/2018 Gender:     M Age:        3 Height:     167.6 cm Weight:     101.7 kg BSA:        2.22 m^2 Pt. Status: Room:  ATTENDING    Norman Herrlich, MD ORDERING     Norman Herrlich, MD REFERRING    Norman Herrlich, MD PERFORMING   [facility] SONOGRAPHER  Jimmy Reel, RDCS  cc:  -------------------------------------------------------------------  ------------------------------------------------------------------- Indications:      Abnormal EKG 794.31.  Palpitations 785.1.  ------------------------------------------------------------------- History:   Risk factors:  Diabetes mellitus. Dyslipidemia.  ------------------------------------------------------------------- Study Conclusions  - Stress ECG conclusions: There were no stress arrhythmias or conduction abnormalities. The stress ECG was negative for ischemia. - Staged echo: There was no echocardiographic evidence for stress-induced ischemia.  Impressions:  - 1. Stress echo negative for evidence of inducible ischemia. 2. Normal left ventricular systolic function at rest and good augmentation of the left ventricular segments at stress 3. Normal exercise  capacity.  ------------------------------------------------------------------- Study data:   Study status:  Routine.  Consent:  The risks, benefits, and alternatives to the procedure were explained to the patient and informed consent was obtained.  Procedure:  The patient reported no pain pre or post test. Initial setup. The patient was brought to the laboratory. A baseline ECG was recorded. Surface  ECG leads and automatic cuff blood pressure measurements were monitored. Treadmill exercise testing was performed using the Bruce protocol. The patient exercised for 8 min 1 sec, to protocol stage 3, to a maximal work rate of 10.1 mets. Exercise was terminated due to moderate fatigue. The patient was positioned for image acquisition and recovery monitoring. Transthoracic stress echocardiography. Images were captured at baseline and peak exercise.  Study completion:  The patient tolerated the procedure well. There were no complications.          Bruce protocol. Stress echocardiography.  Birthdate:  Patient birthdate: 18-Feb-1956.  Age: Patient is 66 yr old.  Sex:  Gender: male.    BMI: 36.2 kg/m^2. Blood pressure:     100/66  Patient status:  Outpatient.  Study date:  Study date: 05/01/2018. Study time: 10:17 AM.  -------------------------------------------------------------------  ------------------------------------------------------------------- Baseline ECG:  Normal.  ------------------------------------------------------------------- Stress protocol:  +--------+---------------+---+------------+--------+ !Stage   !Time into phase!HR !BP (mmHg)   !Symptoms! +--------+---------------+---+------------+--------+ !Baseline!---------------!68 !116/77 (90) !None    ! +--------+---------------+---+------------+--------+ !Stage 1 !---------------!88 !153/72 (99) !--------! +--------+---------------+---+------------+--------+ !Stage 2 !---------------!100!177/71  (106)!--------! +--------+---------------+---+------------+--------+ !Stage 3 !---------------!125!------------!--------! +--------+---------------+---+------------+--------+ !Recovery!3 min 20 sec   !66 !167/65 (99) !--------! +--------+---------------+---+------------+--------+  ------------------------------------------------------------------- Stress results:   Maximal heart rate during stress was 125 bpm (80% of maximal predicted heart rate). The maximal predicted heart rate was 157 bpm.The target heart rate was achieved. The heart rate response to stress was normal. There was a normal resting blood pressure with an appropriate response to stress.  The patient experienced no chest pain during stress.  ------------------------------------------------------------------- Stress ECG:  There were no stress arrhythmias or conduction abnormalities.  The stress ECG was negative for ischemia.  ------------------------------------------------------------------- Baseline:  - The estimated LV ejection fraction was 60%. - Normal wall motion; no LV regional wall motion abnormalities.  Peak stress:  - The estimated LV ejection fraction was 70%. - Normal wall motion; no LV regional wall motion abnormalities.  ------------------------------------------------------------------- Stress echo results:     Left ventricular ejection fraction was normal at rest and with stress. There was no echocardiographic evidence for stress-induced ischemia.  ------------------------------------------------------------------- Measurements  Left ventricle                           Value        Reference LV ID, ED, PLAX chordal                  48    mm     43 - 52 LV ID, ES, PLAX chordal        (H)       39.8  mm     23 - 38 LV fx shortening, PLAX chordal (L)       17    %      >=29 LV PW thickness, ED                      12.7  mm     --------- IVS/LV PW ratio, ED                      0.96          <=1.3  Ventricular septum                       Value        Reference IVS thickness, ED  12.2  mm     ---------  LVOT                                     Value        Reference LVOT ID, S                               22    mm     --------- LVOT area                                3.8   cm^2   ---------  Aorta                                    Value        Reference Aortic root ID, ED                       34    mm     ---------  Left atrium                              Value        Reference LA ID, A-P, ES                           39    mm     --------- LA ID/bsa, A-P                           1.76  cm/m^2 <=2.2  Legend: (L)  and  (H)  mark values outside specified reference range.  ------------------------------------------------------------------- Prepared and Electronically Authenticated by  Belva Crome, MD 2019-06-03T14:10:12   ECHOCARDIOGRAM  ECHOCARDIOGRAM COMPLETE 11/12/2020  Narrative ECHOCARDIOGRAM REPORT    Patient Name:   Derek Moore Date of Exam: 11/12/2020 Medical Rec #:  [id number]         Height:       66.0 in Accession #:    [id number]        Weight:       202.0 lb Date of Birth:  1955-12-15        BSA:          2.008 m Patient Age:    63 years          BP:           108/56 mmHg Patient Gender: M                 HR:           60 bpm. Exam Location:  [facility]  Procedure: 2D Echo  Indications:    PAF (paroxysmal atrial fibrillation) (HCC) [I48.0 (ICD-10-CM)]  History:        Patient has prior history of Echocardiogram examinations, most recent 10/27/2020. Prior CABG, Arrythmias:Abnormal EKG; Risk Factors:Diabetes and Dyslipidemia.  Sonographer:    Louie Boston Referring Phys: [phone] Caylah Plouff J Bilbo Carcamo  IMPRESSIONS   1. Left ventricular ejection fraction, by estimation, is 45 to 50%. The left ventricle has mildly decreased function. The left ventricle has  global hypokinesis. There is mild concentric left ventricular  hypertrophy. 2. The left ventricular global longitudinal strain is abnormal (-10.3 %). 3. Right ventricular systolic function is normal. The right ventricular size is normal. There is normal pulmonary artery systolic pressure. 4. The mitral valve is normal in structure. No evidence of mitral valve regurgitation. No evidence of mitral stenosis. 5. The aortic valve is normal in structure. Aortic valve regurgitation is not visualized. No aortic stenosis is present. 6. The inferior vena cava is normal in size with greater than 50% respiratory variability, suggesting right atrial pressure of 3 mmHg.  FINDINGS Left Ventricle: Left ventricular ejection fraction, by estimation, is 45 to 50%. The left ventricle has mildly decreased function. The left ventricle demonstrates global hypokinesis. The average left ventricular global longitudinal strain is -10.3 %. The global longitudinal strain is abnormal. The left ventricular internal cavity size was normal in size. There is mild concentric left ventricular hypertrophy. Left ventricular diastolic parameters are consistent with Grade I diastolic dysfunction (impaired relaxation).  Right Ventricle: The right ventricular size is normal. No increase in right ventricular wall thickness. Right ventricular systolic function is normal. There is normal pulmonary artery systolic pressure. The tricuspid regurgitant velocity is 2.29 m/s, and with an assumed right atrial pressure of 3 mmHg, the estimated right ventricular systolic pressure is 24.0 mmHg.  Left Atrium: Left atrial size was normal in size.  Right Atrium: Right atrial size was normal in size.  Pericardium: There is no evidence of pericardial effusion. Presence of pericardial fat pad.  Mitral Valve: The mitral valve is normal in structure. No evidence of mitral valve regurgitation. No evidence of mitral valve stenosis.  Tricuspid Valve: The tricuspid valve is normal in structure. Tricuspid valve  regurgitation is not demonstrated. No evidence of tricuspid stenosis.  Aortic Valve: The aortic valve is normal in structure. Aortic valve regurgitation is not visualized. No aortic stenosis is present.  Pulmonic Valve: The pulmonic valve was normal in structure. Pulmonic valve regurgitation is not visualized. No evidence of pulmonic stenosis.  Aorta: The aortic root is normal in size and structure.  Venous: The inferior vena cava is normal in size with greater than 50% respiratory variability, suggesting right atrial pressure of 3 mmHg.  IAS/Shunts: No atrial level shunt detected by color flow Doppler.   LEFT VENTRICLE PLAX 2D LVIDd:         4.90 cm     Diastology LVIDs:         3.50 cm     LV e' medial:    5.98 cm/s LV PW:         1.20 cm     LV E/e' medial:  14.4 LV IVS:        1.20 cm     LV e' lateral:   7.29 cm/s LVOT diam:     2.10 cm     LV E/e' lateral: 11.8 LV SV:         71 LV SV Index:   35          2D Longitudinal Strain LVOT Area:     3.46 cm    2D Strain GLS Avg:     -10.3 %  LV Volumes (MOD) LV vol d, MOD A2C: 52.9 ml LV vol d, MOD A4C: 61.3 ml LV vol s, MOD A2C: 27.5 ml LV vol s, MOD A4C: 32.3 ml LV SV MOD A2C:     25.4 ml LV SV MOD A4C:     61.3 ml  LV SV MOD BP:      28.0 ml  RIGHT VENTRICLE            IVC RV S prime:     8.92 cm/s  IVC diam: 1.50 cm TAPSE (M-mode): 1.7 cm  LEFT ATRIUM             Index       RIGHT ATRIUM           Index LA diam:        4.10 cm 2.04 cm/m  RA Area:     12.10 cm LA Vol (A2C):   46.7 ml 23.25 ml/m RA Volume:   27.90 ml  13.89 ml/m LA Vol (A4C):   38.8 ml 19.32 ml/m LA Biplane Vol: 42.8 ml 21.31 ml/m AORTIC VALVE LVOT Vmax:   104.00 cm/s LVOT Vmean:  68.800 cm/s LVOT VTI:    0.205 m  AORTA Ao Root diam: 3.30 cm Ao Asc diam:  3.10 cm  MITRAL VALVE                TRICUSPID VALVE MV Area (PHT): 2.37 cm     TR Peak grad:   21.0 mmHg MV Decel Time: 320 msec     TR Vmax:        229.00 cm/s MV E velocity: 85.90  cm/s MV A velocity: 109.00 cm/s  SHUNTS MV E/A ratio:  0.79         Systemic VTI:  0.20 m Systemic Diam: 2.10 cm  Kardie Tobb DO Electronically signed by Thomasene Ripple DO Signature Date/Time: 11/12/2020/1:00:17 PM    Final   TEE  ECHO TEE 12/29/2021  Narrative TRANSESOPHOGEAL ECHO REPORT    Patient Name:   Derek ZAUGG Date of Exam: 12/29/2021 Medical Rec #:  [id number]         Height:       66.0 in Accession #:    [id number]        Weight:       190.0 lb Date of Birth:  [birth year]        BSA:          1.957 m Patient Age:    65 years          BP:           102/66 mmHg Patient Gender: M                 HR:           57 bpm. Exam Location:  Inpatient  Procedure: 2D Echo, Color Doppler and Cardiac Doppler  Indications:     Watchman Follow up  History:         Patient has prior history of Echocardiogram examinations, most recent 11/12/2021. Prior CABG, Arrythmias:Atrial Fibrillation; Risk Factors:Diabetes and Dyslipidemia. 24mm Watchman Device implanted 11/12/21.  Sonographer:     Irving Burton Senior RDCS Referring Phys:  [id number] Lanier Prude Diagnosing Phys: Thurmon Fair MD  PROCEDURE: After discussion of the risks and benefits of a TEE, an informed consent was obtained from the patient. The transesophogeal probe was passed without difficulty through the esophogus of the patient. Local oropharyngeal anesthetic was provided with Cetacaine. Sedation performed by different physician. The patient was monitored while under deep sedation. Anesthestetic sedation was provided intravenously by Anesthesiology: 146mg  of Propofol, 60mg  of Lidocaine. The patient's vital signs; including heart rate, blood pressure, and oxygen saturation; remained stable throughout the procedure. The patient developed no complications during the procedure.  IMPRESSIONS   1. Left ventricular ejection fraction, by estimation, is 60 to 65%. The left ventricle has normal function. The left ventricle has  no regional wall motion abnormalities. 2. Right ventricular systolic function is normal. The right ventricular size is normal. 3. Well seated Watchman device with appropriate compression and no leak. There is a tiny iatrogenic ASD with left to right shunt. No left atrial/left atrial appendage thrombus was detected. 4. The mitral valve is normal in structure. No evidence of mitral valve regurgitation. No evidence of mitral stenosis. 5. The aortic valve is normal in structure. Aortic valve regurgitation is not visualized. No aortic stenosis is present. 6. There is Moderate (Grade III) protruding plaque involving the descending aorta. 7. The inferior vena cava is normal in size with greater than 50% respiratory variability, suggesting right atrial pressure of 3 mmHg. 8. Evidence of atrial level shunting detected by color flow Doppler.  Conclusion(s)/Recommendation(s): Normal biventricular function without evidence of hemodynamically significant valvular heart disease.  FINDINGS Left Ventricle: Left ventricular ejection fraction, by estimation, is 60 to 65%. The left ventricle has normal function. The left ventricle has no regional wall motion abnormalities. The left ventricular internal cavity size was normal in size. There is no left ventricular hypertrophy.  Right Ventricle: The right ventricular size is normal. No increase in right ventricular wall thickness. Right ventricular systolic function is normal.  Left Atrium: Well seated Watchman device with appropriate compression and no leak. There is a tiny iatrogenic ASD with left to right shunt. Left atrial size was normal in size. No left atrial/left atrial appendage thrombus was detected.  Right Atrium: Right atrial size was normal in size.  Pericardium: There is no evidence of pericardial effusion.  Mitral Valve: The mitral valve is normal in structure. No evidence of mitral valve regurgitation. No evidence of mitral valve  stenosis.  Tricuspid Valve: The tricuspid valve is normal in structure. Tricuspid valve regurgitation is not demonstrated. No evidence of tricuspid stenosis.  Aortic Valve: The aortic valve is normal in structure. Aortic valve regurgitation is not visualized. No aortic stenosis is present.  Pulmonic Valve: The pulmonic valve was normal in structure. Pulmonic valve regurgitation is not visualized. No evidence of pulmonic stenosis.  Aorta: The aortic root is normal in size and structure. There is moderate (Grade III) protruding plaque involving the descending aorta.  Venous: The inferior vena cava is normal in size with greater than 50% respiratory variability, suggesting right atrial pressure of 3 mmHg.  IAS/Shunts: Evidence of atrial level shunting detected by color flow Doppler.  Derek Hora Croitoru MD Electronically signed by Thurmon Fair MD Signature Date/Time: 12/29/2021/9:22:23 AM    Final   MONITORS  LONG TERM MONITOR (3-14 DAYS) 04/28/2023  Narrative Patch Wear Time:  8 days and 2 hours (2024-05-14T09:59:47-0400 to 2024-05-22T12:39:47-0400)  Patient had a min HR of 47 bpm, max HR of 140 bpm, and avg HR of 66 bpm. Predominant underlying rhythm was Sinus Rhythm. Bundle Branch Block/IVCD was present.  There were 10 triggered and 7 diary events.  All were sinus rhythm may be associated with PVCs and frequent PVCs.  There were no pauses of 3 seconds or greater no episodes of second or third-degree AV nodal block  There were no episodes of atrial fibrillation or flutter.  3 Supraventricular Tachycardia runs occurred, the run with the fastest interval lasting 4 beats with a max rate of 140 bpm, the longest lasting 9 beats with an avg rate of 125 bpm.  Isolated SVEs were rare (<  1.0%), SVE Couplets were rare (<1.0%), and SVE Triplets were rare (<1.0%).  Isolated VEs were rare (<1.0%), and no VE Couplets or VE Triplets were present. Ventricular Trigeminy was present.   CT  SCANS  CT CORONARY MORPH W/CTA COR W/SCORE 09/29/2020  Addendum 09/29/2020 10:12 PM ADDENDUM REPORT: 09/29/2020 22:09  HISTORY: 67 yo male with ECG abnormal, 19yr CHD risk 10-20%, not treadmill candidate Chest pain/anginal equiv  EXAM: Cardiac/Coronary CTA  TECHNIQUE: The patient was scanned on a [employer].  PROTOCOL: A 120 kV prospective scan was triggered in the descending thoracic aorta at 111 HU's. Axial non-contrast 3 mm slices were carried out through the heart. The data set was analyzed on a dedicated work station and scored using the Agatson method. Gantry rotation speed was 250 msecs and collimation was .6 mm. Beta blockade and 0.8 mg of sl NTG was given. The 3D data set was reconstructed in 5% intervals of the 67-82 % of the R-R cycle. Diastolic phases were analyzed on a dedicated work station using MPR, MIP and VRT modes. The patient received 80mL OMNIPAQUE IOHEXOL 350 MG/ML SOLN of contrast.  FINDINGS: Quality: Very good, HR 49  Coronary calcium score: The patient's coronary artery calcium score is 739, which places the patient in the 88th percentile.  Coronary arteries: Normal coronary origins.  Right dominance.  Right Coronary Artery: Dominant with diffuse calcification. Moderate to severe proximal mixed plaque 50-69% stenosis (CADRADS3). Mild mixed 24-49% mid-vessel stenosis (CADRADS2). Distal RCA appears 100% occluded (CADRADS5). There appears to be left to right collaterals from the LCx to R-PDA.  Left Main Coronary Artery: Minimal ostial non-calcified stenosis 1-24% (CADRADS1). Bifurcates into the LAD and LCx arteries.  Left Anterior Descending Coronary Artery: Anterior vessel which is calcified. Ostial moderate to severe mixed 50-69% stenosis (CADRADS3). Mid vessel moderate to severe mixed stenosis (50-69%), just prior to a small D1 (<2.0 mm branch) bifurcation. The distal LAD bifurcates in a pitchfork fashion without significant  disease.  Left Circumflex Artery: AV groove vessel. Minimal 1-24% non-calcified ostial stenosis (CADRADS1). Mild 25-49% mixed plaque which appears positively remodeled (CADRADS2V). Moderate sized proximal OM1 and smaller OM2 without disease.  Aorta: Normal size, 31 mm at the mid ascending aorta (level of the PA bifurcation) measured double oblique. No calcifications. No dissection.  Aortic Valve: Trileaflet.  No calcifications.  Other findings:  Normal pulmonary vein drainage into the left atrium.  Normal left atrial appendage without a thrombus.  Normal size of the pulmonary artery.  IMPRESSION: 1. Moderate to severe multivessel CAD with probable distal RCA occlusion with left to right collaterals, CADRADS = 5. CT FFR will be performed and reported separately.  2. Coronary calcium score of 739. This was 88th percentile for age and sex matched control.  3. Normal coronary origin with right dominance.  4. Definitive cardiac catheterization is recommended.   Electronically Signed By: Chrystie Nose M.D. On: 09/29/2020 22:09  Narrative EXAM: OVER-READ INTERPRETATION  CT CHEST  The following report is an over-read performed by radiologist Dr. Trudie Reed of [facility] on 09/29/2020. This over-read does not include interpretation of cardiac or coronary anatomy or pathology. The coronary calcium score/coronary CTA interpretation by the cardiologist is attached.  COMPARISON:  None.  FINDINGS: Within the visualized portions of the thorax there are no suspicious appearing pulmonary nodules or masses, there is no acute consolidative airspace disease, no pleural effusions, no pneumothorax and no lymphadenopathy. Visualized portions of the upper abdomen are unremarkable. There are no aggressive  appearing lytic or blastic lesions noted in the visualized portions of the skeleton.  IMPRESSION: 1. No significant incidental noncardiac findings are  noted.  Electronically Signed: By: Trudie Reed M.D. On: 09/29/2020 09:08              Recent Labs: No results found for requested labs within last 365 days.  Recent Lipid Panel    Component Value Date/Time   CHOL 136 06/17/2021 0850   TRIG 106 06/17/2021 0850   HDL 50 06/17/2021 0850   CHOLHDL 2.7 06/17/2021 0850   LDLCALC 67 06/17/2021 0850    Physical Exam:    VS:  BP (!) 94/59   Pulse (!) 50   Ht 5\' 6"  (1.676 m)   Wt 169 lb 3.2 oz (76.7 kg)   SpO2 97%   BMI 27.31 kg/m     Wt Readings from Last 3 Encounters:  07/11/23 169 lb 3.2 oz (76.7 kg)  04/12/23 177 lb 12.8 oz (80.6 kg)  12/01/22 184 lb (83.5 kg)    Blood pressure measurement by me 90/48 right arm sitting 84/40 left arm sitting blood pressure 70 systolic standing GEN:  Well nourished, well developed in no acute distress HEENT: Normal NECK: No JVD; No carotid bruits LYMPHATICS: No lymphadenopathy CARDIAC:'s EKG 04/12/2023 shows sinus rhythm normal PR interval LVH repolarization  RRR, no murmurs, rubs, gallops RESPIRATORY:  Clear to auscultation without rales, wheezing or rhonchi  ABDOMEN: Soft, non-tender, non-distended MUSCULOSKELETAL:  No edema; No deformity  SKIN: Warm and dry NEUROLOGIC:  Alert and oriented x 3 PSYCHIATRIC:  Normal affect    Signed, Norman Herrlich, MD  07/11/2023 4:47 PM    [facility]

## 2023-08-22 NOTE — Progress Notes (Unsigned)
Cardiology Office Note:    Date:  08/23/2023   ID:  Derek Moore, Derek Moore [birth year], MRN [id number]  PCP:  Gordan Payment., MD  Cardiologist:  Norman Herrlich, MD    Referring MD: Gordan Payment., MD    ASSESSMENT:    1. Hypotension, unspecified hypotension type   2. PAF (paroxysmal atrial fibrillation) (HCC)   3. Presence of Watchman left atrial appendage closure device   4. Coronary artery disease involving native coronary artery of native heart without angina pectoris   5. Mixed hyperlipidemia    PLAN:    In order of problems listed above:  Improved no longer has resting hypotension feels better but still is lightheaded I think he needs to stop his SGLT2 inhibitor which is worsening autonomic dysfunction continue Florinef midodrine for safety Stable maintaining sinus rhythm has Watchman device no longer anticoagulated Stable CAD having no angina following CABG continue his medical therapy low-dose aspirin high intensity statin Continue his current statin lipid profile 03/16/2003 with his PCP cholesterol 163 LDL 77 triglycerides 62 HDL 72 non-HDL cholesterol 91   Next appointment: 1 year   Medication Adjustments/Labs and Tests Ordered: Current medicines are reviewed at length with the patient today.  Concerns regarding medicines are outlined above.  No orders of the defined types were placed in this encounter.  No orders of the defined types were placed in this encounter.    History of Present Illness:    Derek Moore is a 67 y.o. male with a hx of atrial fibrillation with left atrial appendage thrombus subsequent Watchman device December 2022 iron deficiency anemia type 2 diabetes CAD with remote CABG 2001 and hyperlipidemia as well as myasthenia gravis last seen 07/11/2023 with several episodes of syncope and symptomatic hypotension in the setting of diabetes and his SGLT2 inhibitor.  He is initiated on midodrine and Florinef.  Compliance with diet, lifestyle and  medications: Yes  He is improved he no longer is having severe weakness he still has a little lightheadedness time to time his home blood pressures of midodrine and Florinef for improved but he still gets standing blood pressures today in my office 90/60 at home down to 91/59 I think he needs to stop his SGLT2 inhibitor.  His diabetes recently has been poorly controlled. He is not having edema shortness of breath chest pain palpitation or syncope I suspect that he has diabetic neuropathy autonomic causing his orthostatic hypotension Past Medical History:  Diagnosis Date   Abnormal cardiac CT angiography    Abnormal EKG 03/09/2018   Acquired hypothyroidism 03/07/2018   Allergy    ANA positive 10/02/2020   Formatting of this note might be different from the original.  Homogenous. Other tests negartive. Probably false.  See note 10/02/2020   Aortic atherosclerosis (HCC) 07/17/2020   Formatting of this note might be different from the original. Seen on [facility] 06/2019   Arthritis    Asthma    CAD (coronary artery disease), native coronary artery    Cataract    Chronic gout without tophus 03/07/2018   Coronary artery calcification seen on CAT scan 07/17/2020   Formatting of this note might be different from the original. 06/2019. [facility]   Coronary artery disease involving native coronary artery of native heart without angina pectoris 07/17/2020   Formatting of this note might be different from the original.  Seen on CT [facility] 06/2019     Formatting of this note might be different from the original.  Status post  four-vessel bypass     Formatting of this note might be different from the original.  Status post four-vessel bypass 09/2020   Coronary artery disease of native artery of native heart with stable angina pectoris (HCC)    Cough syncope 1999   Dental caries    Depression 03/07/2018   Diplopia 10/11/2018   DISH (diffuse idiopathic skeletal hyperostosis) 07/17/2020   Formatting of this note might be  different from the original. Seen on [facility] 06/2019   Dizzy 05/09/2018   Gastroesophageal reflux disease without esophagitis 03/07/2018   Formatting of this note might be different from the original. Long term   GERD (gastroesophageal reflux disease) 03/07/2018   Globus sensation 09/22/2022   Gout 03/07/2018   Hay fever    Heel spur, left 02/07/2020   History of asthma 11/06/2020   History of iron deficiency anemia 02/13/2022   History of smokeless tobacco use 07/17/2020   Formatting of this note might be different from the original.  Discussed chantix.  He wants to use. Did well in past. Off label, he understands.     Last Assessment & Plan:   Formatting of this note might be different from the original.  Patient has been without smokeless tobacco for 1 week now   Hyperlipidemia 03/07/2018   Hypomagnesemia 01/29/2021   Hypothyroidism (acquired) 03/07/2018   Formatting of this note might be different from the original. 1990s.   Mild cognitive impairment 07/01/2022   Mild episode of recurrent major depressive disorder (HCC) 07/17/2020   Mixed hyperlipidemia 03/07/2018   Myalgia due to statin 08/05/2021   Obesity    Obesity (BMI 30-39.9) 12/05/2020   Ocular myasthenia gravis (HCC) 07/17/2020   Formatting of this note might be different from the original. Ocular.  Follows at [facility].  Sees neurology and eye doctor.   Orthostatic hypotension 12/19/2020   PAF (paroxysmal atrial fibrillation) (HCC) 11/12/2021   Palpitation 03/08/2018   Plantar fasciitis 02/07/2020   Pneumonia    Presence of Watchman left atrial appendage closure device 11/12/2021   with 24mm Watchman FLX woth Dr. Lalla Brothers   Primary osteoarthritis involving multiple joints 07/17/2020   Recurrent sinusitis    S/P CABG x 4 10/27/2020   Smokeless tobacco use 07/17/2020   Formatting of this note might be different from the original. Discussed chantix.  He wants to use. Did well in past. Off label, he understands.   Subcutaneous  nodules 01/27/2021   Thrombus of left atrial appendage    Tightness of heel cord, left 02/07/2020   Type 2 diabetes mellitus (HCC) 03/07/2018   Type 2 diabetes mellitus without complication, without long-term current use of insulin (HCC) 03/07/2018   Formatting of this note might be different from the original. 2014.   Vitamin B12 deficiency 09/25/2020    Current Medications: Current Meds  Medication Sig   acetaminophen (TYLENOL) 500 MG tablet Take 500-1,000 mg by mouth every 6 (six) hours as needed (for pain.).   albuterol (PROVENTIL) (2.5 MG/3ML) 0.083% nebulizer solution Take 2.5 mg by nebulization every 6 (six) hours as needed for wheezing or shortness of breath.   albuterol (VENTOLIN HFA) 108 (90 Base) MCG/ACT inhaler Inhale into the lungs every 6 (six) hours as needed for wheezing or shortness of breath.   aspirin EC 81 MG tablet Take 81 mg by mouth daily.   Dextran 70-Hypromellose, PF, (ARTIFICIAL TEARS PF) 0.1-0.3 % SOLN Place 1 drop into both eyes 3 (three) times daily as needed (dry/irritated eyes.).   ferrous sulfate  325 (65 FE) MG EC tablet Take 325 mg by mouth 3 (three) times a week.   fludrocortisone (FLORINEF) 0.1 MG tablet Take 1 tablet (0.1 mg total) by mouth daily.   JARDIANCE 10 MG TABS tablet Take 10 mg by mouth daily.   levothyroxine (SYNTHROID) 125 MCG tablet Take 125 mcg by mouth daily.   Magnesium Oxide 250 MG TABS Take 250 mg by mouth daily.   metFORMIN (GLUCOPHAGE) 1000 MG tablet Take 1,000 mg by mouth 2 (two) times daily with a meal.   midodrine (PROAMATINE) 5 MG tablet Take 1 tablet (5 mg total) by mouth 3 (three) times daily with meals.   mycophenolate (CELLCEPT) 500 MG tablet Take 1,500 mg by mouth 2 (two) times daily.   Omega-3 Fatty Acids (FISH OIL ULTRA) 1400 MG CAPS Take 1,400 mg by mouth in the morning and at bedtime.   OZEMPIC, 1 MG/DOSE, 4 MG/3ML SOPN Inject 1 mg into the skin once a week.   pantoprazole (PROTONIX) 40 MG tablet Take 40 mg by mouth  daily.   PARoxetine (PAXIL) 40 MG tablet Take 40 mg by mouth daily.   pyridostigmine (MESTINON) 60 MG tablet Take 90 mg by mouth 4 (four) times daily. Take 1 1/2 tablet (90 mg) 4 times daily   rosuvastatin (CRESTOR) 10 MG tablet TAKE 1 TABLET BY MOUTH DAILY   vitamin B-12 (CYANOCOBALAMIN) 1000 MCG tablet Take 1,000 mcg by mouth daily.      EKGs/Labs/Other Studies Reviewed:    The following studies were reviewed today:         Recent Labs: 07/12/2023: BUN 15; Creatinine, Ser 0.96; Potassium 4.1; Sodium 143  Recent Lipid Panel    Component Value Date/Time   CHOL 136 06/17/2021 0850   TRIG 106 06/17/2021 0850   HDL 50 06/17/2021 0850   CHOLHDL 2.7 06/17/2021 0850   LDLCALC 67 06/17/2021 0850    Physical Exam:    VS:  BP (!) 102/50   Pulse (!) 54   Ht 5\' 6"  (1.676 m)   Wt 167 lb (75.8 kg)   SpO2 94%   BMI 26.95 kg/m     Wt Readings from Last 3 Encounters:  08/23/23 167 lb (75.8 kg)  07/11/23 169 lb 3.2 oz (76.7 kg)  04/12/23 177 lb 12.8 oz (80.6 kg)  Blood pressure by me in my office today sitting 96/70 standing 90/70 asymptomatic   GEN:  Well nourished, well developed in no acute distress HEENT: Normal NECK: No JVD; No carotid bruits LYMPHATICS: No lymphadenopathy CARDIAC: RRR, no murmurs, rubs, gallops RESPIRATORY:  Clear to auscultation without rales, wheezing or rhonchi  ABDOMEN: Soft, non-tender, non-distended MUSCULOSKELETAL:  No edema; No deformity  SKIN: Warm and dry NEUROLOGIC:  Alert and oriented x 3 PSYCHIATRIC:  Normal affect    Signed, Norman Herrlich, MD  08/23/2023 8:55 AM    [facility]

## 2023-08-23 ENCOUNTER — Ambulatory Visit: Payer: Commercial Managed Care - PPO | Attending: Cardiology | Admitting: Cardiology

## 2023-08-23 ENCOUNTER — Encounter: Payer: Self-pay | Admitting: Cardiology

## 2023-08-23 VITALS — BP 102/50 | HR 54 | Ht 66.0 in | Wt 167.0 lb

## 2023-08-23 DIAGNOSIS — I2510 Atherosclerotic heart disease of native coronary artery without angina pectoris: Secondary | ICD-10-CM | POA: Diagnosis not present

## 2023-08-23 DIAGNOSIS — I959 Hypotension, unspecified: Secondary | ICD-10-CM

## 2023-08-23 DIAGNOSIS — I480 Paroxysmal atrial fibrillation: Secondary | ICD-10-CM | POA: Diagnosis not present

## 2023-08-23 DIAGNOSIS — E782 Mixed hyperlipidemia: Secondary | ICD-10-CM

## 2023-08-23 DIAGNOSIS — Z95818 Presence of other cardiac implants and grafts: Secondary | ICD-10-CM | POA: Diagnosis not present

## 2023-08-23 NOTE — Patient Instructions (Signed)
Medication Instructions:  Your physician has recommended you make the following change in your medication:   STOP: Jardiance  *If you need a refill on your cardiac medications before your next appointment, please call your pharmacy*   Lab Work: None If you have labs (blood work) drawn today and your tests are completely normal, you will receive your results only by: MyChart Message (if you have MyChart) OR A paper copy in the mail If you have any lab test that is abnormal or we need to change your treatment, we will call you to review the results.   Testing/Procedures: None   Follow-Up: At [facility], you and your health needs are our priority.  As part of our continuing mission to provide you with exceptional heart care, we have created designated Provider Care Teams.  These Care Teams include your primary Cardiologist (physician) and Advanced Practice Providers (APPs -  Physician Assistants and Nurse Practitioners) who all work together to provide you with the care you need, when you need it.  We recommend signing up for the patient portal called "MyChart".  Sign up information is provided on this After Visit Summary.  MyChart is used to connect with patients for Virtual Visits (Telemedicine).  Patients are able to view lab/test results, encounter notes, upcoming appointments, etc.  Non-urgent messages can be sent to your provider as well.   To learn more about what you can do with MyChart, go to [email or web address].    Your next appointment:   1 year(s)  Provider:   Norman Herrlich, MD    Other Instructions None

## 2023-10-06 ENCOUNTER — Telehealth: Payer: Self-pay

## 2023-10-06 NOTE — Telephone Encounter (Signed)
Callback team please contact requesting provider to determine what type of anesthesia will be used for extractions.    Thanks, Alden Server

## 2023-10-06 NOTE — Telephone Encounter (Signed)
   Pre-operative Risk Assessment    Patient Name: Derek Moore  DOB: 1956-08-03 MRN: [id number]     Request for Surgical Clearance    Procedure:  Dental Extraction - Amount of Teeth to be Pulled:  2 with possible bone grafting and implants under SIV  Date of Surgery:  Clearance TBD                                 Surgeon:  Dr. Jeanice Lim Surgeon's Group or Practice Name:  [facility] Phone number:  [phone] Fax number:  [phone]   Type of Clearance Requested:   - Medical    Type of Anesthesia:  Not Indicated   Additional requests/questions:    SignedDione Housekeeper   10/06/2023, 8:10 AM

## 2023-10-06 NOTE — Telephone Encounter (Signed)
   Patient Name: MALIQUE DRISKILL  DOB: [birth year] MRN: [id number]  Primary Cardiologist: Thomasene Ripple, DO  Chart reviewed as part of pre-operative protocol coverage.    Simple dental extractions (i.e. 1-2 teeth) are considered low risk procedures per guidelines and generally do not require any specific cardiac clearance. It is also generally accepted that for simple extractions and dental cleanings, there is no need to interrupt blood thinner therapy.   SBE prophylaxis is not required for the patient from a cardiac standpoint.  I will route this recommendation to the requesting party via Epic fax function and remove from pre-op pool.  Please call with questions.  Napoleon Form, Leodis Rains, NP 10/06/2023, 9:10 AM

## 2023-10-06 NOTE — Telephone Encounter (Signed)
Requesting office will be using local anesthetics

## 2023-10-07 ENCOUNTER — Other Ambulatory Visit: Payer: Self-pay | Admitting: Medical Genetics

## 2023-10-07 DIAGNOSIS — Z006 Encounter for examination for normal comparison and control in clinical research program: Secondary | ICD-10-CM

## 2023-10-17 ENCOUNTER — Other Ambulatory Visit: Payer: Self-pay | Admitting: Cardiology

## 2023-11-21 ENCOUNTER — Other Ambulatory Visit (HOSPITAL_COMMUNITY): Payer: Self-pay | Attending: Medical Genetics

## 2023-12-06 ENCOUNTER — Encounter: Payer: Self-pay | Admitting: Cardiology

## 2023-12-09 ENCOUNTER — Other Ambulatory Visit: Payer: Self-pay

## 2023-12-09 MED ORDER — AMOXICILLIN 500 MG PO CAPS: ORAL_CAPSULE | ORAL | 0 refills | Status: DC

## 2023-12-23 ENCOUNTER — Telehealth: Payer: Self-pay

## 2023-12-23 NOTE — Telephone Encounter (Signed)
Called to check in with patient, who had LAAO on 11/12/2021. The patient reports doing well with no issues.  The patient understands to call with questions or concerns.   He was grateful for call and agreed with plan.

## 2024-04-19 NOTE — Progress Notes (Deleted)
 Derek Moore

## 2024-04-20 ENCOUNTER — Ambulatory Visit: Payer: Self-pay | Admitting: Gastroenterology

## 2024-05-17 ENCOUNTER — Encounter: Payer: Self-pay | Admitting: Gastroenterology

## 2024-05-17 ENCOUNTER — Ambulatory Visit: Payer: Self-pay | Admitting: Gastroenterology

## 2024-05-17 VITALS — BP 82/46 | HR 56 | Ht 66.0 in | Wt 167.0 lb

## 2024-05-17 DIAGNOSIS — R634 Abnormal weight loss: Secondary | ICD-10-CM

## 2024-05-17 DIAGNOSIS — R112 Nausea with vomiting, unspecified: Secondary | ICD-10-CM

## 2024-05-17 DIAGNOSIS — K219 Gastro-esophageal reflux disease without esophagitis: Secondary | ICD-10-CM | POA: Diagnosis not present

## 2024-05-17 DIAGNOSIS — D649 Anemia, unspecified: Secondary | ICD-10-CM | POA: Diagnosis not present

## 2024-05-17 DIAGNOSIS — R1310 Dysphagia, unspecified: Secondary | ICD-10-CM

## 2024-05-17 NOTE — Patient Instructions (Addendum)
 Dysphagia precautions Can coat pills or applesauce or pudding to help go down Continue pantoprazole  40 mg po daily.  Continue GERD diet, no late meals 3-4 hours before lying down  You have been scheduled for an endoscopy. Please follow written instructions given to you at your visit today.  If you use inhalers (even only as needed), please bring them with you on the day of your procedure.  If you take any of the following medications, they will need to be adjusted prior to your procedure:   DO NOT TAKE 7 DAYS PRIOR TO TEST- Trulicity (dulaglutide) Ozempic, Wegovy (semaglutide) Mounjaro (tirzepatide) Bydureon Bcise (exanatide extended release)  DO NOT TAKE 1 DAY PRIOR TO YOUR TEST Rybelsus (semaglutide) Adlyxin (lixisenatide) Victoza (liraglutide) Byetta (exanatide) ___________________________________________________________________________  Due to recent changes in healthcare laws, you may see the results of your imaging and laboratory studies on MyChart before your provider has had a chance to review them.  We understand that in some cases there may be results that are confusing or concerning to you. Not all laboratory results come back in the same time frame and the provider may be waiting for multiple results in order to interpret others.  Please give us  48 hours in order for your provider to thoroughly review all the results before contacting the office for clarification of your results.   _______________________________________________________  If your blood pressure at your visit was 140/90 or greater, please contact your primary care physician to follow up on this.  _______________________________________________________  If you are age 71 or older, your body mass index should be between 23-30. Your Body mass index is 26.95 kg/m. If this is out of the aforementioned range listed, please consider follow up with your Primary Care Provider.  If you are age 33 or younger, your  body mass index should be between 19-25. Your Body mass index is 26.95 kg/m. If this is out of the aformentioned range listed, please consider follow up with your Primary Care Provider.   ________________________________________________________  The [facility] providers would like to encourage you to use MYCHART to communicate with providers for non-urgent requests or questions.  Due to long hold times on the telephone, sending your provider a message by [facility] may be a faster and more efficient way to get a response.  Please allow 48 business hours for a response.  Please remember that this is for non-urgent requests.  _______________________________________________________   Thank you for trusting me with your gastrointestinal care. Deanna May, RNP

## 2024-05-17 NOTE — Progress Notes (Signed)
 Chief Complaint:eso dysphagia, nausea, weight loss Primary GI Doctor:Dr. Venice Gillis  HPI:  Patient is a  68  year old male/male patient with past medical history of atrial fibrillation with left atrial appendage thrombus subsequent Watchman device December 2022, iron deficiency anemia, type 2 diabetes, CAD with remote CABG 2001, and hyperlipidemia, as well as myasthenia gravis, who was referred to me by Abbe Hoard., MD on 04/24/24 for a complaint of eso dysphagia, history of colonic polyps .   Last seen in GI office by Dr. Venice Gillis on 01/29/21 for eso dysphagia.Barium swallow ordered. Full report below.  07/2023 last seen by cardiology. Reviewed note.  Interval History    Patient presents for evaluation of esophageal dysphagia, weight loss, and nausea and vomiting. He reported no appetite. He states he would be outside working and vomit liquid with no warning.  He states the he would have 1-2 episodes every 1-2 weeks.  He reports he has been on Ozempic for past 5 years, but states these issues occurred about 4 months ago. He had lost about 25lbs in 6 mths which concerned his PCP. His PCP stopped the Ozempic about 6 weeks ago and he reports his appetite has improved. He has gained 10lbs since being off the medication. He reports he has continued with nausea but no episodes of vomiting.  Patient has history of GERD and taking Protonix  40 mg p.o. once a day. He states this controls his symptoms. He reports he will have episodes where he feels urge to swallow and cannot. He will take a sip of water and will swallow. He also notes problems swallowing pills at times.    No shortness of breath or chest pain.  Patient has regular bowel movements 1-2 per day. No blood in stool. He has intermittent lower abdominal discomfort that feels like he needs to defecate. He states pain reoslves with bowel movement. Notes it can be worse with certain fruits high in fructose like watermelon.  Socially drinks, 1 beer every  3 mths. Chews tobacco.   Patient has paroxysmal atrial fibrillation and had atrial surgery and no longer uses anticoagulants.Patient on baby ASA 81 mg po daily.  Patient's family history includes father with lung Ca, two uncles with throat CA.  Patient retired and has a lot of hobbies that keep him active such as yard work. His wife works as a Charity fundraiser at hospital.  SH: Married to [employer]   Past GI procedures: Colonoscopy 09/2018:  - Mild left colonic diverticulosis. - Non-bleeding internal hemorrhoids. - Otherwise normal colonoscopy. - Repeat in 10 years.  Earlier, if with any new problems.   EGD 03/2007: Small hiatal hernia, mild gastritis.   MBS 07/2012: Mild pharyngeal dysphagia  Esophogram 01/2021:Normal esophageal motility. No esophageal mass or stricture identified. Small sliding-type hiatal hernia and mild inducible GE reflux with water swallowing.  Wt Readings from Last 3 Encounters:  05/17/24 167 lb (75.8 kg)  08/23/23 167 lb (75.8 kg)  07/11/23 169 lb 3.2 oz (76.7 kg)      Past Medical History:  Diagnosis Date   Abnormal cardiac CT angiography    Abnormal EKG 03/09/2018   Acquired hypothyroidism 03/07/2018   Allergy    ANA positive 10/02/2020   Formatting of this note might be different from the original.  Homogenous. Other tests negartive. Probably false.  See note 10/02/2020   Aortic atherosclerosis (HCC) 07/17/2020   Formatting of this note might be different from the original. Seen on CT [facility] 06/2019   Arthritis  Asthma    CAD (coronary artery disease), native coronary artery    Cataract    Chronic gout without tophus 03/07/2018   Coronary artery calcification seen on CAT scan 07/17/2020   Formatting of this note might be different from the original. 06/2019. [facility]   Coronary artery disease involving native coronary artery of native heart without angina pectoris 07/17/2020   Formatting of this note might be different from the original.  Seen on CT [facility] 06/2019      Formatting of this note might be different from the original.  Status post four-vessel bypass     Formatting of this note might be different from the original.  Status post four-vessel bypass 09/2020   Coronary artery disease of native artery of native heart with stable angina pectoris (HCC)    Cough syncope 1999   Dental caries    Depression 03/07/2018   Diplopia 10/11/2018   DISH (diffuse idiopathic skeletal hyperostosis) 07/17/2020   Formatting of this note might be different from the original. Seen on [facility] 06/2019   Dizzy 05/09/2018   Gastroesophageal reflux disease without esophagitis 03/07/2018   Formatting of this note might be different from the original. Long term   GERD (gastroesophageal reflux disease) 03/07/2018   Globus sensation 09/22/2022   Gout 03/07/2018   Hay fever    Heel spur, left 02/07/2020   History of asthma 11/06/2020   History of iron deficiency anemia 02/13/2022   History of smokeless tobacco use 07/17/2020   Formatting of this note might be different from the original.  Discussed chantix.  He wants to use. Did well in past. Off label, he understands.     Last Assessment & Plan:   Formatting of this note might be different from the original.  Patient has been without smokeless tobacco for 1 week now   Hyperlipidemia 03/07/2018   Hypomagnesemia 01/29/2021   Hypothyroidism (acquired) 03/07/2018   Formatting of this note might be different from the original. 1990s.   Mild cognitive impairment 07/01/2022   Mild episode of recurrent major depressive disorder (HCC) 07/17/2020   Mixed hyperlipidemia 03/07/2018   Myalgia due to statin 08/05/2021   Obesity    Obesity (BMI 30-39.9) 12/05/2020   Ocular myasthenia gravis (HCC) 07/17/2020   Formatting of this note might be different from the original. Ocular.  Follows at [facility].  Sees neurology and eye doctor.   Orthostatic hypotension 12/19/2020   PAF (paroxysmal atrial fibrillation) (HCC) 11/12/2021   Palpitation  03/08/2018   Plantar fasciitis 02/07/2020   Pneumonia    Presence of Watchman left atrial appendage closure device 11/12/2021   with 24mm Watchman FLX woth Dr. Marven Slimmer   Primary osteoarthritis involving multiple joints 07/17/2020   Recurrent sinusitis    S/P CABG x 4 10/27/2020   Smokeless tobacco use 07/17/2020   Formatting of this note might be different from the original. Discussed chantix.  He wants to use. Did well in past. Off label, he understands.   Subcutaneous nodules 01/27/2021   Thrombus of left atrial appendage    Tightness of heel cord, left 02/07/2020   Type 2 diabetes mellitus (HCC) 03/07/2018   Type 2 diabetes mellitus without complication, without long-term current use of insulin  (HCC) 03/07/2018   Formatting of this note might be different from the original. 2014.   Vitamin B12 deficiency 09/25/2020    Past Surgical History:  Procedure Laterality Date   CATARACT EXTRACTION W/ INTRAOCULAR LENS  IMPLANT, BILATERAL     COLONOSCOPY W/  POLYPECTOMY  11/27/2012   Colonic polyp, status post polypectomy. Mild sigmoid diverticulosis.   CORONARY ARTERY BYPASS GRAFT N/A 10/27/2020   Procedure: CORONARY ARTERY BYPASS GRAFTING (CABG), ON PUMP, TIMES FOUR, USING LEFT INTERNAL MAMMARY ARTERY AND ENDOSCOPICALLY HARVESTED RIGHT GREATER SAPHENOUS VEIN;  Surgeon: Bartley Lightning, MD;  Location: [facility] OR;  Service: Open Heart Surgery;  Laterality: N/A;   DENTAL SURGERY     implants x 2   detatched retina     ESOPHAGOGASTRODUODENOSCOPY  04/17/2007   Small hiatal hernia. Mild gastritis. Otherwise normal EGD   kidney stone extraction     cystoscopy x 2   KNEE SURGERY Left    bone spurs   LEFT ATRIAL APPENDAGE OCCLUSION N/A 11/12/2021   Procedure: LEFT ATRIAL APPENDAGE OCCLUSION;  Surgeon: Boyce Byes, MD;  Location: [facility] INVASIVE CV LAB;  Service: Cardiovascular;  Laterality: N/A;   LEFT HEART CATH AND CORONARY ANGIOGRAPHY N/A 10/08/2020   Procedure: LEFT HEART CATH AND CORONARY  ANGIOGRAPHY;  Surgeon: Arleen Lacer, MD;  Location: [facility] INVASIVE CV LAB;  Service: Cardiovascular;  Laterality: N/A;   NOSE SURGERY     TEE WITHOUT CARDIOVERSION N/A 10/27/2020   Procedure: TRANSESOPHAGEAL ECHOCARDIOGRAM (TEE);  Surgeon: Bartley Lightning, MD;  Location: [facility] OR;  Service: Open Heart Surgery;  Laterality: N/A;   TEE WITHOUT CARDIOVERSION N/A 01/08/2021   Procedure: TRANSESOPHAGEAL ECHOCARDIOGRAM (TEE);  Surgeon: Liza Riggers, MD;  Location: [facility] ENDOSCOPY;  Service: Cardiovascular;  Laterality: N/A;   TEE WITHOUT CARDIOVERSION N/A 11/12/2021   Procedure: TRANSESOPHAGEAL ECHOCARDIOGRAM (TEE);  Surgeon: Boyce Byes, MD;  Location: [facility] INVASIVE CV LAB;  Service: Cardiovascular;  Laterality: N/A;   TEE WITHOUT CARDIOVERSION N/A 12/29/2021   Procedure: TRANSESOPHAGEAL ECHOCARDIOGRAM (TEE);  Surgeon: Luana Rumple, MD;  Location: [facility] ENDOSCOPY;  Service: Cardiovascular;  Laterality: N/A;    Current Outpatient Medications  Medication Sig Dispense Refill   acetaminophen  (TYLENOL ) 500 MG tablet Take 500-1,000 mg by mouth every 6 (six) hours as needed (for pain.).     albuterol  (PROVENTIL ) (2.5 MG/3ML) 0.083% nebulizer solution Take 2.5 mg by nebulization every 6 (six) hours as needed for wheezing or shortness of breath.     albuterol  (VENTOLIN  HFA) 108 (90 Base) MCG/ACT inhaler Inhale into the lungs every 6 (six) hours as needed for wheezing or shortness of breath.     aspirin  EC 81 MG tablet Take 81 mg by mouth daily.     Dextran 70-Hypromellose, PF, (ARTIFICIAL TEARS PF) 0.1-0.3 % SOLN Place 1 drop into both eyes 3 (three) times daily as needed (dry/irritated eyes.).     fludrocortisone  (FLORINEF ) 0.1 MG tablet Take 1 tablet (0.1 mg total) by mouth daily. 90 tablet 3   levothyroxine  (SYNTHROID ) 125 MCG tablet Take 125 mcg by mouth daily.     Magnesium  Oxide 250 MG TABS Take 250 mg by mouth daily.     metFORMIN (GLUCOPHAGE) 1000 MG tablet Take 1,000 mg by mouth 2 (two) times  daily with a meal.     midodrine  (PROAMATINE ) 5 MG tablet Take 1 tablet (5 mg total) by mouth 3 (three) times daily with meals. 270 tablet 3   mycophenolate (CELLCEPT) 500 MG tablet Take 1,500 mg by mouth 2 (two) times daily.     Omega-3 Fatty Acids (FISH OIL ULTRA) 1400 MG CAPS Take 1,400 mg by mouth in the morning and at bedtime. (Patient taking differently: Take 1,400 mg by mouth daily.)     OZEMPIC, 1 MG/DOSE, 4 MG/3ML SOPN Inject 1  mg into the skin once a week.     pantoprazole  (PROTONIX ) 40 MG tablet Take 40 mg by mouth daily.     PARoxetine  (PAXIL ) 40 MG tablet Take 40 mg by mouth daily.     pyridostigmine  (MESTINON ) 60 MG tablet Take 90 mg by mouth 4 (four) times daily. Take 1 1/2 tablet (90 mg) 4 times daily (Patient taking differently: Take 120 mg by mouth 4 (four) times daily. Take 2 tablets (120 mg) 4 times daily)     vitamin B-12 (CYANOCOBALAMIN) 1000 MCG tablet Take 1,000 mcg by mouth daily.     No current facility-administered medications for this visit.    Allergies as of 05/17/2024 - Review Complete 08/23/2023  Allergen Reaction Noted   Codeine Itching 10/07/2013   Hydrocodone-acetaminophen  Hives and Itching 03/07/2018   Lipitor [atorvastatin] Other (See Comments) 10/02/2020   Amoxicillin  Rash 05/17/2024   Latex Itching 11/14/2019    Family History  Problem Relation Age of Onset   Rheum arthritis Mother    Heart Problems Mother    Cancer Father    Hypertension Father    CAD Father    Blindness Father    Esophageal cancer Paternal Uncle    Colon cancer Neg Hx    Rectal cancer Neg Hx    Stomach cancer Neg Hx     Review of Systems:    Constitutional: No weight loss, fever, chills, weakness or fatigue HEENT: Eyes: No change in vision               Ears, Nose, Throat:  No change in hearing or congestion Skin: No rash or itching Cardiovascular: No chest pain, chest pressure or palpitations   Respiratory: No SOB or cough Gastrointestinal: See HPI and otherwise  negative Genitourinary: No dysuria or change in urinary frequency Neurological: No headache, dizziness or syncope Musculoskeletal: No new muscle or joint pain Hematologic: No bleeding or bruising Psychiatric: No history of depression or anxiety    Physical Exam:  Vital signs: BP (!) 82/46   Pulse (!) 56   Ht 5' 6 (1.676 m)   Wt 167 lb (75.8 kg)   SpO2 95%   BMI 26.95 kg/m   Constitutional:   Pleasant male appears to be in NAD, Well developed, Well nourished, alert and cooperative Throat: Oral cavity and pharynx without inflammation, swelling or lesion.  Respiratory: Respirations even and unlabored. Lungs clear to auscultation bilaterally.   No wheezes, crackles, or rhonchi.  Cardiovascular: Normal S1, S2. Regular rate and rhythm. No peripheral edema, cyanosis or pallor.  Gastrointestinal:  Soft, nondistended, nontender. No rebound or guarding. Normal bowel sounds. No appreciable masses or hepatomegaly. Rectal:  Not performed.  Msk:  Symmetrical without gross deformities. Without edema, no deformity or joint abnormality.  Neurologic:  Alert and  oriented x4;  grossly normal neurologically.  Skin:   Dry and intact without significant lesions or rashes. Psychiatric: Oriented to person, place and time. Demonstrates good judgement and reason without abnormal affect or behaviors.  RELEVANT LABS AND IMAGING: CBC    Latest Ref Rng & Units 12/14/2021   10:53 AM 11/05/2021    9:50 AM 01/02/2021    9:59 AM  CBC  WBC 3.4 - 10.8 x10E3/uL 9.4  5.8  5.7   Hemoglobin 13.0 - 17.7 g/dL 16.1  09.6  04.5   Hematocrit 37.5 - 51.0 % 43.5  42.5  42.0   Platelets 150 - 450 x10E3/uL 200  152  221      CMP  Latest Ref Rng & Units 07/12/2023    8:12 AM 12/14/2021   10:53 AM 11/05/2021    9:50 AM  CMP  Glucose 70 - 99 mg/dL 161  096  045   BUN 8 - 27 mg/dL 15  23  19    Creatinine 0.76 - 1.27 mg/dL 4.09  8.11  9.14   Sodium 134 - 144 mmol/L 143  138  139   Potassium 3.5 - 5.2 mmol/L 4.1  4.3   4.2   Chloride 96 - 106 mmol/L 104  102  102   CO2 20 - 29 mmol/L 25  22  29    Calcium  8.6 - 10.2 mg/dL 9.3  9.5  9.1      Lab Results  Component Value Date   TSH 29.200 (H) 11/11/2020  03/29/24 labs show: Ha1c 6.1 , tsh 0.849, b 12 620 10/18/2018- Impression: - Mild left colonic diverticulosis. - Non- bleeding internal hemorrhoids. - Otherwise normal colonoscopy. Recall 10 years 12/29/21 echo- Left ventricular ejection fraction, by estimation, is 60 to 65%.  Assessment: Encounter Diagnoses  Name Primary?   Loss of weight Yes   Nausea and vomiting, unspecified vomiting type    Gastroesophageal reflux disease, unspecified whether esophagitis present    Pill dysphagia      68 year old male patient that presents with main complaint of nausea, vomiting, weight loss and poor appetite. Patient had been on Ozempic for about 5 years but has every send the last 3 to 4 months he lost significant weight of about 25 pounds. His PCP stopped the ozempic about 6 weeks ago and his appetite has improved. He has continued with the nausea and vomiting. Mild anemia Hgb 13.5.  Patient does have history of GERD that is managed on PPI therapy.  And he has had intermittent issues with dysphagia with normal esophagram back in 22.  At that time patient was recently placed on anticoagulation therefore a EGD was not pursued.  Due to more recent events and new symptoms patient would like to proceed with upper GI endoscopy to rule out gastritis, peptic ulcer disease, or esophagitis.    Patient also has history of colonic polyps but is up-to-date on colonoscopy, next due in November 2029.  Patient currently does not exhibit any lower GI symptoms.  Plan: - Cont Pantoprazole  40 mg po daily  -Strict GERD diet, no late meals -we discussed coating pills with pudding or applesauce to help it go down. -offered nausea medication, declined  -Schedule EGD with possible dilatation in [facility] with Dr. Venice Gillis The risks and benefits of EGD  with possible biopsies and esophageal dilation were discussed with the patient who agrees to proceed. -recall colonoscopy 09/2028  Thank you for the courtesy of this consult. Please call me with any questions or concerns.   Lynetta Tomczak, FNP-C  Gastroenterology 05/17/2024, 11:09 AM  Cc: Abbe Hoard., MD

## 2024-05-18 NOTE — Progress Notes (Signed)
 Agree with assessment/plan.  Edman Circle, MD [facility] [phone]

## 2024-06-12 ENCOUNTER — Other Ambulatory Visit: Payer: Self-pay | Admitting: Medical Genetics

## 2024-06-12 DIAGNOSIS — Z006 Encounter for examination for normal comparison and control in clinical research program: Secondary | ICD-10-CM

## 2024-06-18 ENCOUNTER — Other Ambulatory Visit: Payer: Self-pay | Admitting: Cardiology

## 2024-06-25 ENCOUNTER — Other Ambulatory Visit: Payer: Self-pay | Admitting: Medical Genetics

## 2024-06-25 ENCOUNTER — Telehealth: Payer: Self-pay | Admitting: Medical Genetics

## 2024-06-25 DIAGNOSIS — Z006 Encounter for examination for normal comparison and control in clinical research program: Secondary | ICD-10-CM

## 2024-06-25 NOTE — Telephone Encounter (Signed)
 [facility]  06/25/2024 2:09 PM  Confirmed I was speaking with MEHRAN GUDERIAN [phone] by using name and DOB. Informed participant the reason for this call is to follow-up on a recent sample the participant provided at one of the [facility] lab locations. Informed participant the test was not able to be completed with this sample and apologized for the inconvenience. Participant was requested to provide a new sample at one of our participating labs at no cost so that participant can continue participation and receive test results. Informed participant they do not need to be fasting and if there are other samples that need to be drawn, they can be done at the same visit. Participant has not had a blood transfusion or blood product in the last 30 days. Participant agreed to provide another sample. Participant was provided the [employer] program website to learn why this may have happened. Participant was thanked for their time and continued support of the above study.  Jordyn Pennstrom, BS [place]  [facility] Clinical Research Specialist II Direct Dial: [phone]  Fax: [phone]

## 2024-06-29 ENCOUNTER — Encounter: Admitting: Gastroenterology

## 2024-07-06 ENCOUNTER — Encounter (HOSPITAL_COMMUNITY): Payer: Self-pay

## 2024-07-06 ENCOUNTER — Other Ambulatory Visit: Payer: Self-pay

## 2024-07-06 ENCOUNTER — Inpatient Hospital Stay (HOSPITAL_COMMUNITY)
Admission: EM | Admit: 2024-07-06 | Discharge: 2024-07-10 | DRG: 322 | Disposition: A | Discharge status: Home / Self Care | Source: Other Acute Inpatient Hospital | Attending: Internal Medicine | Admitting: Internal Medicine

## 2024-07-06 ENCOUNTER — Encounter (HOSPITAL_COMMUNITY): Payer: Self-pay | Admitting: Internal Medicine

## 2024-07-06 DIAGNOSIS — Z951 Presence of aortocoronary bypass graft: Secondary | ICD-10-CM | POA: Diagnosis not present

## 2024-07-06 DIAGNOSIS — F32A Depression, unspecified: Secondary | ICD-10-CM | POA: Diagnosis present

## 2024-07-06 DIAGNOSIS — M159 Polyosteoarthritis, unspecified: Secondary | ICD-10-CM | POA: Diagnosis present

## 2024-07-06 DIAGNOSIS — Z7984 Long term (current) use of oral hypoglycemic drugs: Secondary | ICD-10-CM | POA: Diagnosis not present

## 2024-07-06 DIAGNOSIS — E1169 Type 2 diabetes mellitus with other specified complication: Principal | ICD-10-CM | POA: Diagnosis present

## 2024-07-06 DIAGNOSIS — Z87442 Personal history of urinary calculi: Secondary | ICD-10-CM

## 2024-07-06 DIAGNOSIS — Z8261 Family history of arthritis: Secondary | ICD-10-CM

## 2024-07-06 DIAGNOSIS — Z7982 Long term (current) use of aspirin: Secondary | ICD-10-CM | POA: Diagnosis not present

## 2024-07-06 DIAGNOSIS — Z79899 Other long term (current) drug therapy: Secondary | ICD-10-CM | POA: Diagnosis not present

## 2024-07-06 DIAGNOSIS — I2510 Atherosclerotic heart disease of native coronary artery without angina pectoris: Principal | ICD-10-CM | POA: Diagnosis present

## 2024-07-06 DIAGNOSIS — M791 Myalgia, unspecified site: Secondary | ICD-10-CM | POA: Diagnosis not present

## 2024-07-06 DIAGNOSIS — G7000 Myasthenia gravis without (acute) exacerbation: Secondary | ICD-10-CM | POA: Diagnosis present

## 2024-07-06 DIAGNOSIS — I459 Conduction disorder, unspecified: Secondary | ICD-10-CM | POA: Diagnosis present

## 2024-07-06 DIAGNOSIS — J45909 Unspecified asthma, uncomplicated: Secondary | ICD-10-CM | POA: Diagnosis present

## 2024-07-06 DIAGNOSIS — I2582 Chronic total occlusion of coronary artery: Secondary | ICD-10-CM | POA: Diagnosis present

## 2024-07-06 DIAGNOSIS — Z9842 Cataract extraction status, left eye: Secondary | ICD-10-CM

## 2024-07-06 DIAGNOSIS — I249 Acute ischemic heart disease, unspecified: Secondary | ICD-10-CM | POA: Diagnosis present

## 2024-07-06 DIAGNOSIS — Z91040 Latex allergy status: Secondary | ICD-10-CM

## 2024-07-06 DIAGNOSIS — Z7952 Long term (current) use of systemic steroids: Secondary | ICD-10-CM

## 2024-07-06 DIAGNOSIS — E119 Type 2 diabetes mellitus without complications: Secondary | ICD-10-CM | POA: Diagnosis not present

## 2024-07-06 DIAGNOSIS — E782 Mixed hyperlipidemia: Secondary | ICD-10-CM | POA: Diagnosis present

## 2024-07-06 DIAGNOSIS — I25118 Atherosclerotic heart disease of native coronary artery with other forms of angina pectoris: Secondary | ICD-10-CM | POA: Diagnosis not present

## 2024-07-06 DIAGNOSIS — Z880 Allergy status to penicillin: Secondary | ICD-10-CM

## 2024-07-06 DIAGNOSIS — I951 Orthostatic hypotension: Secondary | ICD-10-CM | POA: Diagnosis present

## 2024-07-06 DIAGNOSIS — Z860100 Personal history of colon polyps, unspecified: Secondary | ICD-10-CM

## 2024-07-06 DIAGNOSIS — E669 Obesity, unspecified: Secondary | ICD-10-CM | POA: Diagnosis present

## 2024-07-06 DIAGNOSIS — R079 Chest pain, unspecified: Secondary | ICD-10-CM | POA: Diagnosis not present

## 2024-07-06 DIAGNOSIS — I25110 Atherosclerotic heart disease of native coronary artery with unstable angina pectoris: Principal | ICD-10-CM | POA: Diagnosis present

## 2024-07-06 DIAGNOSIS — E039 Hypothyroidism, unspecified: Secondary | ICD-10-CM | POA: Diagnosis present

## 2024-07-06 DIAGNOSIS — F17290 Nicotine dependence, other tobacco product, uncomplicated: Secondary | ICD-10-CM | POA: Diagnosis present

## 2024-07-06 DIAGNOSIS — M1A9XX0 Chronic gout, unspecified, without tophus (tophi): Secondary | ICD-10-CM | POA: Diagnosis present

## 2024-07-06 DIAGNOSIS — Z6828 Body mass index (BMI) 28.0-28.9, adult: Secondary | ICD-10-CM

## 2024-07-06 DIAGNOSIS — E785 Hyperlipidemia, unspecified: Secondary | ICD-10-CM

## 2024-07-06 DIAGNOSIS — Z885 Allergy status to narcotic agent status: Secondary | ICD-10-CM

## 2024-07-06 DIAGNOSIS — K2100 Gastro-esophageal reflux disease with esophagitis, without bleeding: Secondary | ICD-10-CM | POA: Diagnosis not present

## 2024-07-06 DIAGNOSIS — Z79890 Hormone replacement therapy (postmenopausal): Secondary | ICD-10-CM

## 2024-07-06 DIAGNOSIS — Z8249 Family history of ischemic heart disease and other diseases of the circulatory system: Secondary | ICD-10-CM

## 2024-07-06 DIAGNOSIS — K219 Gastro-esophageal reflux disease without esophagitis: Secondary | ICD-10-CM | POA: Diagnosis present

## 2024-07-06 DIAGNOSIS — I200 Unstable angina: Secondary | ICD-10-CM | POA: Diagnosis not present

## 2024-07-06 DIAGNOSIS — Z961 Presence of intraocular lens: Secondary | ICD-10-CM | POA: Diagnosis present

## 2024-07-06 DIAGNOSIS — Z821 Family history of blindness and visual loss: Secondary | ICD-10-CM

## 2024-07-06 DIAGNOSIS — Z95818 Presence of other cardiac implants and grafts: Secondary | ICD-10-CM

## 2024-07-06 DIAGNOSIS — Z79624 Long term (current) use of inhibitors of nucleotide synthesis: Secondary | ICD-10-CM

## 2024-07-06 DIAGNOSIS — T466X5A Adverse effect of antihyperlipidemic and antiarteriosclerotic drugs, initial encounter: Secondary | ICD-10-CM | POA: Diagnosis present

## 2024-07-06 DIAGNOSIS — I480 Paroxysmal atrial fibrillation: Secondary | ICD-10-CM | POA: Diagnosis present

## 2024-07-06 DIAGNOSIS — Z800 Family history of malignant neoplasm of digestive organs: Secondary | ICD-10-CM

## 2024-07-06 DIAGNOSIS — Z7985 Long-term (current) use of injectable non-insulin antidiabetic drugs: Secondary | ICD-10-CM | POA: Diagnosis not present

## 2024-07-06 DIAGNOSIS — Z9841 Cataract extraction status, right eye: Secondary | ICD-10-CM

## 2024-07-06 DIAGNOSIS — Z955 Presence of coronary angioplasty implant and graft: Secondary | ICD-10-CM

## 2024-07-06 HISTORY — DX: Myasthenia gravis without (acute) exacerbation: G70.00

## 2024-07-06 LAB — TROPONIN I (HIGH SENSITIVITY)
Troponin I (High Sensitivity): 6 ng/L (ref <18)
Troponin I (High Sensitivity): 6 ng/L (ref <18)
Troponin I (High Sensitivity): 7 ng/L (ref <18)

## 2024-07-06 LAB — HIV ANTIBODY (ROUTINE TESTING W REFLEX): HIV Screen 4th Generation wRfx: NONREACTIVE

## 2024-07-06 MED ORDER — ALBUTEROL SULFATE (2.5 MG/3ML) 0.083% IN NEBU: 2.5000 mg | INHALATION_SOLUTION | Freq: Four times a day (QID) | RESPIRATORY_TRACT | Status: DC | PRN

## 2024-07-06 MED ORDER — MIDODRINE HCL 5 MG PO TABS
5.0000 mg | ORAL_TABLET | Freq: Three times a day (TID) | ORAL | Status: DC
  Administered 2024-07-07 – 2024-07-10 (×11): 5 mg via ORAL
  Filled 2024-07-06 (×9): qty 1

## 2024-07-06 MED ORDER — ONDANSETRON HCL 4 MG PO TABS: 4.0000 mg | ORAL_TABLET | Freq: Four times a day (QID) | ORAL | Status: DC | PRN

## 2024-07-06 MED ORDER — PANTOPRAZOLE SODIUM 40 MG PO TBEC
40.0000 mg | DELAYED_RELEASE_TABLET | Freq: Every day | ORAL | Status: DC
  Administered 2024-07-06 – 2024-07-10 (×7): 40 mg via ORAL
  Filled 2024-07-06 (×5): qty 1

## 2024-07-06 MED ORDER — FLUDROCORTISONE ACETATE 0.1 MG PO TABS
100.0000 ug | ORAL_TABLET | Freq: Every day | ORAL | Status: DC
  Administered 2024-07-07 – 2024-07-10 (×6): 100 ug via ORAL
  Filled 2024-07-06 (×4): qty 1

## 2024-07-06 MED ORDER — PYRIDOSTIGMINE BROMIDE 60 MG PO TABS
120.0000 mg | ORAL_TABLET | Freq: Four times a day (QID) | ORAL | Status: DC
  Administered 2024-07-07 – 2024-07-10 (×17): 120 mg via ORAL
  Filled 2024-07-06 (×15): qty 2

## 2024-07-06 MED ORDER — HYDROMORPHONE HCL 1 MG/ML IJ SOLN: 1.0000 mg | INTRAMUSCULAR | Status: DC | PRN

## 2024-07-06 MED ORDER — NITROGLYCERIN 0.4 MG SL SUBL: 0.4000 mg | SUBLINGUAL_TABLET | SUBLINGUAL | Status: DC | PRN

## 2024-07-06 MED ORDER — HEPARIN (PORCINE) 25000 UT/250ML-% IV SOLN
1150.0000 [IU]/h | INTRAVENOUS | Status: DC
  Administered 2024-07-07: 1400 [IU]/h via INTRAVENOUS
  Administered 2024-07-07 – 2024-07-08 (×2): 1150 [IU]/h via INTRAVENOUS
  Filled 2024-07-06 (×4): qty 250

## 2024-07-06 MED ORDER — MYCOPHENOLATE MOFETIL 250 MG PO CAPS
1500.0000 mg | ORAL_CAPSULE | Freq: Two times a day (BID) | ORAL | Status: DC
  Administered 2024-07-06 – 2024-07-10 (×11): 1500 mg via ORAL
  Filled 2024-07-06 (×9): qty 6

## 2024-07-06 MED ORDER — ASPIRIN 81 MG PO TBEC
81.0000 mg | DELAYED_RELEASE_TABLET | Freq: Every day | ORAL | Status: DC
  Administered 2024-07-06 – 2024-07-10 (×5): 81 mg via ORAL
  Filled 2024-07-06 (×5): qty 1

## 2024-07-06 MED ORDER — PAROXETINE HCL 20 MG PO TABS
40.0000 mg | ORAL_TABLET | Freq: Every day | ORAL | Status: DC
  Administered 2024-07-07 – 2024-07-10 (×6): 40 mg via ORAL
  Filled 2024-07-06 (×4): qty 2

## 2024-07-06 MED ORDER — MYCOPHENOLATE MOFETIL 500 MG PO TABS
1500.0000 mg | ORAL_TABLET | Freq: Two times a day (BID) | ORAL | Status: DC
  Filled 2024-07-06 (×2): qty 3

## 2024-07-06 MED ORDER — ENOXAPARIN SODIUM 40 MG/0.4ML IJ SOSY: 40.0000 mg | PREFILLED_SYRINGE | INTRAMUSCULAR | Status: DC

## 2024-07-06 MED ORDER — ONDANSETRON HCL 4 MG/2ML IJ SOLN: 4.0000 mg | Freq: Four times a day (QID) | INTRAMUSCULAR | Status: DC | PRN

## 2024-07-06 MED ORDER — LEVOTHYROXINE SODIUM 25 MCG PO TABS
125.0000 ug | ORAL_TABLET | Freq: Every day | ORAL | Status: DC
  Administered 2024-07-07 – 2024-07-10 (×6): 125 ug via ORAL
  Filled 2024-07-06 (×4): qty 1

## 2024-07-06 NOTE — Assessment & Plan Note (Addendum)
 Hold on metformin and GLP 1 agonist.  Fasting glucose is 129 mg/dl  Will hold on insulin  therapy for now.

## 2024-07-06 NOTE — Assessment & Plan Note (Signed)
 Continue pantoprazole.

## 2024-07-06 NOTE — Assessment & Plan Note (Addendum)
 Patient currently sinus rhythm, not on AV blockade.  Sp left atrial appendage closure device, not on anticoagulation.

## 2024-07-06 NOTE — H&P (Signed)
 History and Physical    Patient: Derek Moore:[id number] DOB: 09/01/1956 DOA: 07/06/2024 DOS: the patient was seen and examined on 07/06/2024 PCP: Thurmond Cathlyn LABOR., MD  Patient coming from: [facility] ED.   Chief Complaint: No chief complaint on file.  HPI: Derek Moore is a 68 y.o. male with medical history significant of myasthenia gravis, T2DM, coronary artery disease, CABG 2021, atrial fibrillation, who presented with chest pain.   Today he had acute chest pain, severe in intensity, pressure like, substernal to epigastric/ right upper quadrant in location, associated with dyspnea, nausea and diaphoresis. Radiated to the left arm.  Symptoms resolved spontaneously after 30 to 45 minutes. He presented to the ED in [facility]. By the time he was evaluated his pain had resolved. Apparently he had intermittent left arm pain for one week prior, that occurred intermittently at rest and on exertion.  His EKG had inferolateral new ischemic changes, and cardiac markers were negative.  Cardiology in [facility] was contacted and recommended transfer for further ischemic workup.   At home he is physically active, doing moderate intensity work in his yard, mowing the grass and planting trees. No angina symptoms.  At the time of my examination he is chest pain free.    Review of Systems: As mentioned in the history of present illness. All other systems reviewed and are negative. Past Medical History:  Diagnosis Date   Abnormal cardiac CT angiography    Abnormal EKG 03/09/2018   Acquired hypothyroidism 03/07/2018   Allergy    ANA positive 10/02/2020   Formatting of this note might be different from the original.  Homogenous. Other tests negartive. Probably false.  See note 10/02/2020   Aortic atherosclerosis (HCC) 07/17/2020   Formatting of this note might be different from the original. Seen on [facility] 06/2019   Arthritis    Asthma    CAD (coronary artery disease), native coronary  artery    Cataract    Chronic gout without tophus 03/07/2018   Coronary artery calcification seen on CAT scan 07/17/2020   Formatting of this note might be different from the original. 06/2019. [facility]   Coronary artery disease involving native coronary artery of native heart without angina pectoris 07/17/2020   Formatting of this note might be different from the original.  Seen on CT [facility] 06/2019     Formatting of this note might be different from the original.  Status post four-vessel bypass     Formatting of this note might be different from the original.  Status post four-vessel bypass 09/2020   Coronary artery disease of native artery of native heart with stable angina pectoris (HCC)    Cough syncope 1999   Dental caries    Depression 03/07/2018   Diplopia 10/11/2018   DISH (diffuse idiopathic skeletal hyperostosis) 07/17/2020   Formatting of this note might be different from the original. Seen on CT [facility] 06/2019   Dizzy 05/09/2018   Gastroesophageal reflux disease without esophagitis 03/07/2018   Formatting of this note might be different from the original. Long term   GERD (gastroesophageal reflux disease) 03/07/2018   Globus sensation 09/22/2022   Gout 03/07/2018   Hay fever    Heel spur, left 02/07/2020   History of asthma 11/06/2020   History of iron deficiency anemia 02/13/2022   History of smokeless tobacco use 07/17/2020   Formatting of this note might be different from the original.  Discussed chantix.  He wants to use. Did well in past.  Off label, he understands.     Last Assessment & Plan:   Formatting of this note might be different from the original.  Patient has been without smokeless tobacco for 1 week now   Hyperlipidemia 03/07/2018   Hypomagnesemia 01/29/2021   Hypothyroidism (acquired) 03/07/2018   Formatting of this note might be different from the original. 1990s.   Mild cognitive impairment 07/01/2022   Mild episode of recurrent major depressive disorder (HCC)  07/17/2020   Mixed hyperlipidemia 03/07/2018   Myalgia due to statin 08/05/2021   Obesity    Obesity (BMI 30-39.9) 12/05/2020   Ocular myasthenia gravis (HCC) 07/17/2020   Formatting of this note might be different from the original. Ocular.  Follows at [facility].  Sees neurology and eye doctor.   Orthostatic hypotension 12/19/2020   PAF (paroxysmal atrial fibrillation) (HCC) 11/12/2021   Palpitation 03/08/2018   Plantar fasciitis 02/07/2020   Pneumonia    Presence of Watchman left atrial appendage closure device 11/12/2021   with 24mm Watchman FLX woth Dr. Cindie   Primary osteoarthritis involving multiple joints 07/17/2020   Recurrent sinusitis    S/P CABG x 4 10/27/2020   Smokeless tobacco use 07/17/2020   Formatting of this note might be different from the original. Discussed chantix.  He wants to use. Did well in past. Off label, he understands.   Subcutaneous nodules 01/27/2021   Thrombus of left atrial appendage    Tightness of heel cord, left 02/07/2020   Type 2 diabetes mellitus (HCC) 03/07/2018   Type 2 diabetes mellitus without complication, without long-term current use of insulin  (HCC) 03/07/2018   Formatting of this note might be different from the original. 2014.   Vitamin B12 deficiency 09/25/2020   Past Surgical History:  Procedure Laterality Date   CATARACT EXTRACTION W/ INTRAOCULAR LENS  IMPLANT, BILATERAL     COLONOSCOPY W/ POLYPECTOMY  11/27/2012   Colonic polyp, status post polypectomy. Mild sigmoid diverticulosis.   CORONARY ARTERY BYPASS GRAFT N/A 10/27/2020   Procedure: CORONARY ARTERY BYPASS GRAFTING (CABG), ON PUMP, TIMES FOUR, USING LEFT INTERNAL MAMMARY ARTERY AND ENDOSCOPICALLY HARVESTED RIGHT GREATER SAPHENOUS VEIN;  Surgeon: Lucas Dorise POUR, MD;  Location: [facility] OR;  Service: Open Heart Surgery;  Laterality: N/A;   DENTAL SURGERY     implants x 2   detatched retina     ESOPHAGOGASTRODUODENOSCOPY  04/17/2007   Small hiatal hernia. Mild gastritis.  Otherwise normal EGD   kidney stone extraction     cystoscopy x 2   KNEE SURGERY Left    bone spurs   LEFT ATRIAL APPENDAGE OCCLUSION N/A 11/12/2021   Procedure: LEFT ATRIAL APPENDAGE OCCLUSION;  Surgeon: Cindie Ole DASEN, MD;  Location: [facility] INVASIVE CV LAB;  Service: Cardiovascular;  Laterality: N/A;   LEFT HEART CATH AND CORONARY ANGIOGRAPHY N/A 10/08/2020   Procedure: LEFT HEART CATH AND CORONARY ANGIOGRAPHY;  Surgeon: Anner Alm ORN, MD;  Location: [facility] INVASIVE CV LAB;  Service: Cardiovascular;  Laterality: N/A;   NOSE SURGERY     TEE WITHOUT CARDIOVERSION N/A 10/27/2020   Procedure: TRANSESOPHAGEAL ECHOCARDIOGRAM (TEE);  Surgeon: Lucas Dorise POUR, MD;  Location: [facility] OR;  Service: Open Heart Surgery;  Laterality: N/A;   TEE WITHOUT CARDIOVERSION N/A 01/08/2021   Procedure: TRANSESOPHAGEAL ECHOCARDIOGRAM (TEE);  Surgeon: Maranda Leim DEL, MD;  Location: [facility] ENDOSCOPY;  Service: Cardiovascular;  Laterality: N/A;   TEE WITHOUT CARDIOVERSION N/A 11/12/2021   Procedure: TRANSESOPHAGEAL ECHOCARDIOGRAM (TEE);  Surgeon: Cindie Ole DASEN, MD;  Location: [facility] INVASIVE CV LAB;  Service:  Cardiovascular;  Laterality: N/A;   TEE WITHOUT CARDIOVERSION N/A 12/29/2021   Procedure: TRANSESOPHAGEAL ECHOCARDIOGRAM (TEE);  Surgeon: Francyne Headland, MD;  Location: [facility] ENDOSCOPY;  Service: Cardiovascular;  Laterality: N/A;   Social History:  reports that he quit smoking about 24 years ago. His smoking use included cigars and pipe. His smokeless tobacco use includes snuff. He reports current alcohol use. He reports that he does not use drugs.  Allergies  Allergen Reactions   Codeine Itching   Hydrocodone-Acetaminophen  Hives and Itching   Lipitor [Atorvastatin] Other (See Comments)    Aches and pains   Amoxicillin  Rash   Latex Itching    Family History  Problem Relation Age of Onset   Rheum arthritis Mother    Heart Problems Mother    Cancer Father    Hypertension Father    CAD Father    Blindness  Father    Esophageal cancer Paternal Uncle    Colon cancer Neg Hx    Rectal cancer Neg Hx    Stomach cancer Neg Hx     Prior to Admission medications   Medication Sig Start Date End Date Taking? Authorizing Provider  acetaminophen  (TYLENOL ) 500 MG tablet Take 500-1,000 mg by mouth every 6 (six) hours as needed (for pain.).    [provider]  albuterol  (PROVENTIL ) (2.5 MG/3ML) 0.083% nebulizer solution Take 2.5 mg by nebulization every 6 (six) hours as needed for wheezing or shortness of breath. 11/06/20   [provider]  albuterol  (VENTOLIN  HFA) 108 (90 Base) MCG/ACT inhaler Inhale into the lungs every 6 (six) hours as needed for wheezing or shortness of breath.    [provider]  aspirin  EC 81 MG tablet Take 81 mg by mouth daily.    [provider]  Dextran 70-Hypromellose, PF, (ARTIFICIAL TEARS PF) 0.1-0.3 % SOLN Place 1 drop into both eyes 3 (three) times daily as needed (dry/irritated eyes.).    [provider]  fludrocortisone  (FLORINEF ) 0.1 MG tablet TAKE 1 TABLET BY MOUTH DAILY. 06/19/24   Monetta Redell PARAS, MD  levothyroxine  (SYNTHROID ) 125 MCG tablet Take 125 mcg by mouth daily. 06/17/23   [provider]  Magnesium  Oxide 250 MG TABS Take 250 mg by mouth daily. 01/29/21   [provider]  metFORMIN (GLUCOPHAGE) 1000 MG tablet Take 1,000 mg by mouth 2 (two) times daily with a meal.    [provider]  midodrine  (PROAMATINE ) 5 MG tablet Take 1 tablet (5 mg total) by mouth 3 (three) times daily with meals. 07/11/23   Monetta Redell PARAS, MD  mycophenolate  (CELLCEPT ) 500 MG tablet Take 1,500 mg by mouth 2 (two) times daily. 11/11/21   [provider]  Omega-3 Fatty Acids (FISH OIL ULTRA) 1400 MG CAPS Take 1,400 mg by mouth in the morning and at bedtime. Patient taking differently: Take 1,400 mg by mouth daily.    [provider]  OZEMPIC, 1 MG/DOSE, 4 MG/3ML SOPN Inject 1 mg into the skin once a week. 06/17/23    [provider]  pantoprazole  (PROTONIX ) 40 MG tablet Take 40 mg by mouth daily.    [provider]  PARoxetine  (PAXIL ) 40 MG tablet Take 40 mg by mouth daily. 12/25/20   [provider]  pyridostigmine  (MESTINON ) 60 MG tablet Take 90 mg by mouth 4 (four) times daily. Take 1 1/2 tablet (90 mg) 4 times daily Patient taking differently: Take 120 mg by mouth 4 (four) times daily. Take 2 tablets (120 mg) 4 times daily 11/15/19  [provider]  vitamin B-12 (CYANOCOBALAMIN) 1000 MCG tablet Take 1,000 mcg by mouth daily.    [provider]    Physical Exam: Vitals:   07/06/24 1709  BP: 120/69  Pulse: (!) 46  Resp: 18  Temp: 98.1 F (36.7 C)  TempSrc: Oral  SpO2: 100%   BP 120/69 (BP Location: Left Arm)   Pulse (!) 46   Temp 98.1 F (36.7 C) (Oral)   Resp 18   SpO2 100%   Neurology awake and alert ENT with mild pallor with no icterus Cardiovascular with S1 and S2 present and regular with no gallops, rubs or murmurs Respiratory with no rales or rhonchi, no wheezing Abdomen with no distention  No lower extremity edema   Data Reviewed:   Na 140, K 4.0 Cl 107 bicarbonate 27 glucose 181 bun 16 cr 0,60  Wbc 5.0 hgb 13,6 plt 158  D dimer 0,29  Troponin I < 0.01, 0.02   BNP 168  AST 73 ALT 29  total bilirubin 0,9 ALK P 85  Lipase 8,872   Chest radiograph result no acute changes CT abdomen and pelvis negative for acute intra abdominal or pelvic pathology. Pancrease negative   10:08 EKG 56 bpm, normal axis, qtc 508, sinus rhythm with PAC, poor RR wave progression, ST depression II, III, aVF, negative T wave lead II, III, avF  12:08 EKG 51 bpm, normal axis, qtc 504, sinus rhythm, poor RR wave progression, ST depression in lead II, III, avF, negative T wave lead I, aVL, V4 to V6.   Assessment and Plan: CAD (coronary artery disease), native coronary artery Chest pain, not exertion related, with inferolateral ischemic changes on  EKG. Prior history of CABG (reported not having angina when diagnosed with coronary artery disease)  Currently chest pain free.  Plan to continue trend high sensitive troponin Antiplatelet therapy with aspirin  No statin due to diagnosis of myasthenia gravis.   Anticoagulation with heparin  drip for now  Hold on B blocker due to risk of bradycardia Check echocardiogram for possible wall motion abnormalities.  Patient may need coronary angiography on this admission.  Add nitroglycerin  and hydromorphone  as needed in case of chest pain.   PAF (paroxysmal atrial fibrillation) (HCC) Patient currently sinus rhythm, not on AV blockade.  Sp left atrial appendage closure device, not on anticoagulation.  Continue telemetry monitoring.   Orthostatic hypotension Continue with midodrine  and fludrocortisone .   Type 2 diabetes mellitus with hyperlipidemia (HCC) Hold on metformin and GLP 1 agonist.  Random glucose less than 200 mg/dl Will hold on insulin  therapy for now.   Acquired hypothyroidism Continue levothyroxine    GERD (gastroesophageal reflux disease) Continue pantoprazole    Myasthenia gravis (HCC) Continue with pyridostigmine , no signs of acute decompensation.  Continue with mycophenolate .   Depression Continue with paroxetine     Advance Care Planning:   Code Status: Prior   Consults: cardiology   Family Communication: I spoke with patient's family at the bedside, we talked in detail about patient's condition, plan of care and prognosis and all questions were addressed.   Severity of Illness: The appropriate patient status for this patient is OBSERVATION. Observation status is judged to be reasonable and necessary in order to provide the required intensity of service to ensure the patient's safety. The patient's presenting symptoms, physical exam findings, and initial radiographic and laboratory data in the context of their medical condition is felt to place them at decreased  risk for further clinical deterioration. Furthermore, it is anticipated that the  patient will be medically stable for discharge from the hospital within 2 midnights of admission.   Author: Elidia Toribio Furnace, MD 07/06/2024 5:48 PM  For on call review [email or web address].

## 2024-07-06 NOTE — Assessment & Plan Note (Addendum)
 Continue with pyridostigmine , no signs of acute decompensation.  Continue with mycophenolate .

## 2024-07-06 NOTE — Assessment & Plan Note (Signed)
 Continue levothyroxine 

## 2024-07-06 NOTE — Consult Note (Signed)
 Cardiology Consultation   Patient ID: Derek Moore MRN: [id number]; DOB: October 24, 1956  Admit date: 07/06/2024 Date of Consult: 07/06/2024  PCP:  Thurmond Cathlyn LABOR., MD   [facility] Providers Cardiologist:  Dub Huntsman, DO  Electrophysiologist:  OLE ONEIDA HOLTS, MD  {    Patient Profile: Derek Moore is a 68 y.o. male with a hx of CAD with prior CABG, PAF with watchman device,  who is being seen 07/06/2024 for the evaluation of chest pain at the request of Dr Noralee.  History of Present Illness: Mr. Derek Moore 68 yo male history of PAF with watchman device, DM2, CAD with CABG in 2021 4 vessel (LIMA-D2 LAD too small for grafting), SVG-D1, SVG-OM1, SVG-PDA. , HLD, myasthenia gravis, chronic hypotension on midodrine , HLD intolerant to statins, presented to [facility] with chest pain. Seen by cardiology at [facility], concern given typical symptoms and new inferior and lateral precordial TWIs/ST depressions for unstable angina. Transffered to [facility] for cath.   Patient reports intermittent left arm pain over the last week, would occur at rest or with activity. No other associated symptoms. Would last a few minutes at a time. Today developed sudden mid to right chest pressing like feeling in chest lasting about 45 minutes, some associated nausea and feeling hot but no other symptoms.     [place] [facility] WBC 5 Hgb 13.6 Plt 158 K 4 BUN 16 Cr 0.80 Trop neg x 2.  EKG sinus brady, inferior and lateral precoridal TWIs and ST depressions CXR no acute process  Past Medical History:  Diagnosis Date   Abnormal cardiac CT angiography    Abnormal EKG 03/09/2018   Acquired hypothyroidism 03/07/2018   Allergy    ANA positive 10/02/2020   Formatting of this note might be different from the original.  Homogenous. Other tests negartive. Probably false.  See note 10/02/2020   Aortic atherosclerosis (HCC) 07/17/2020   Formatting of this note might be different from the  original. Seen on CT [facility] 06/2019   Arthritis    Asthma    CAD (coronary artery disease), native coronary artery    Cataract    Chronic gout without tophus 03/07/2018   Coronary artery calcification seen on CAT scan 07/17/2020   Formatting of this note might be different from the original. 06/2019. [facility]   Coronary artery disease involving native coronary artery of native heart without angina pectoris 07/17/2020   Formatting of this note might be different from the original.  Seen on [facility] 06/2019     Formatting of this note might be different from the original.  Status post four-vessel bypass     Formatting of this note might be different from the original.  Status post four-vessel bypass 09/2020   Coronary artery disease of native artery of native heart with stable angina pectoris (HCC)    Cough syncope 1999   Dental caries    Depression 03/07/2018   Diplopia 10/11/2018   DISH (diffuse idiopathic skeletal hyperostosis) 07/17/2020   Formatting of this note might be different from the original. Seen on CT [facility] 06/2019   Dizzy 05/09/2018   Gastroesophageal reflux disease without esophagitis 03/07/2018   Formatting of this note might be different from the original. Long term   GERD (gastroesophageal reflux disease) 03/07/2018   Globus sensation 09/22/2022   Gout 03/07/2018   Hay fever    Heel spur, left 02/07/2020   History of asthma 11/06/2020   History of iron deficiency anemia 02/13/2022  History of smokeless tobacco use 07/17/2020   Formatting of this note might be different from the original.  Discussed chantix.  He wants to use. Did well in past. Off label, he understands.     Last Assessment & Plan:   Formatting of this note might be different from the original.  Patient has been without smokeless tobacco for 1 week now   Hyperlipidemia 03/07/2018   Hypomagnesemia 01/29/2021   Hypothyroidism (acquired) 03/07/2018   Formatting of this note might be different from the original. 1990s.    Mild cognitive impairment 07/01/2022   Mild episode of recurrent major depressive disorder (HCC) 07/17/2020   Mixed hyperlipidemia 03/07/2018   Myalgia due to statin 08/05/2021   Obesity    Obesity (BMI 30-39.9) 12/05/2020   Ocular myasthenia gravis (HCC) 07/17/2020   Formatting of this note might be different from the original. Ocular.  Follows at [facility].  Sees neurology and eye doctor.   Orthostatic hypotension 12/19/2020   PAF (paroxysmal atrial fibrillation) (HCC) 11/12/2021   Palpitation 03/08/2018   Plantar fasciitis 02/07/2020   Pneumonia    Presence of Watchman left atrial appendage closure device 11/12/2021   with 24mm Watchman FLX woth Dr. Cindie   Primary osteoarthritis involving multiple joints 07/17/2020   Recurrent sinusitis    S/P CABG x 4 10/27/2020   Smokeless tobacco use 07/17/2020   Formatting of this note might be different from the original. Discussed chantix.  He wants to use. Did well in past. Off label, he understands.   Subcutaneous nodules 01/27/2021   Thrombus of left atrial appendage    Tightness of heel cord, left 02/07/2020   Type 2 diabetes mellitus (HCC) 03/07/2018   Type 2 diabetes mellitus without complication, without long-term current use of insulin  (HCC) 03/07/2018   Formatting of this note might be different from the original. 2014.   Vitamin B12 deficiency 09/25/2020    Past Surgical History:  Procedure Laterality Date   CATARACT EXTRACTION W/ INTRAOCULAR LENS  IMPLANT, BILATERAL     COLONOSCOPY W/ POLYPECTOMY  11/27/2012   Colonic polyp, status post polypectomy. Mild sigmoid diverticulosis.   CORONARY ARTERY BYPASS GRAFT N/A 10/27/2020   Procedure: CORONARY ARTERY BYPASS GRAFTING (CABG), ON PUMP, TIMES FOUR, USING LEFT INTERNAL MAMMARY ARTERY AND ENDOSCOPICALLY HARVESTED RIGHT GREATER SAPHENOUS VEIN;  Surgeon: Lucas Dorise POUR, MD;  Location: [facility] OR;  Service: Open Heart Surgery;  Laterality: N/A;   DENTAL SURGERY     implants x 2   detatched  retina     ESOPHAGOGASTRODUODENOSCOPY  04/17/2007   Small hiatal hernia. Mild gastritis. Otherwise normal EGD   kidney stone extraction     cystoscopy x 2   KNEE SURGERY Left    bone spurs   LEFT ATRIAL APPENDAGE OCCLUSION N/A 11/12/2021   Procedure: LEFT ATRIAL APPENDAGE OCCLUSION;  Surgeon: Cindie Ole DASEN, MD;  Location: [facility] INVASIVE CV LAB;  Service: Cardiovascular;  Laterality: N/A;   LEFT HEART CATH AND CORONARY ANGIOGRAPHY N/A 10/08/2020   Procedure: LEFT HEART CATH AND CORONARY ANGIOGRAPHY;  Surgeon: Anner Alm ORN, MD;  Location: [facility] INVASIVE CV LAB;  Service: Cardiovascular;  Laterality: N/A;   NOSE SURGERY     TEE WITHOUT CARDIOVERSION N/A 10/27/2020   Procedure: TRANSESOPHAGEAL ECHOCARDIOGRAM (TEE);  Surgeon: Lucas Dorise POUR, MD;  Location: [facility] OR;  Service: Open Heart Surgery;  Laterality: N/A;   TEE WITHOUT CARDIOVERSION N/A 01/08/2021   Procedure: TRANSESOPHAGEAL ECHOCARDIOGRAM (TEE);  Surgeon: Maranda Leim DEL, MD;  Location: [facility] ENDOSCOPY;  Service:  Cardiovascular;  Laterality: N/A;   TEE WITHOUT CARDIOVERSION N/A 11/12/2021   Procedure: TRANSESOPHAGEAL ECHOCARDIOGRAM (TEE);  Surgeon: Cindie Ole DASEN, MD;  Location: [facility] INVASIVE CV LAB;  Service: Cardiovascular;  Laterality: N/A;   TEE WITHOUT CARDIOVERSION N/A 12/29/2021   Procedure: TRANSESOPHAGEAL ECHOCARDIOGRAM (TEE);  Surgeon: Francyne Headland, MD;  Location: [facility] ENDOSCOPY;  Service: Cardiovascular;  Laterality: N/A;      Scheduled Meds:  aspirin  EC  81 mg Oral Daily   fludrocortisone   100 mcg Oral Daily   levothyroxine   125 mcg Oral Daily   [START ON 07/07/2024] midodrine   5 mg Oral TID WC   mycophenolate   1,500 mg Oral BID   pantoprazole   40 mg Oral Daily   PARoxetine   40 mg Oral Daily   pyridostigmine   120 mg Oral QID   Continuous Infusions:  [START ON 07/07/2024] heparin      PRN Meds: albuterol , HYDROmorphone  (DILAUDID ) injection, nitroGLYCERIN , ondansetron  **OR** ondansetron  (ZOFRAN ) IV  Allergies:     Allergies  Allergen Reactions   Codeine Itching   Hydrocodone-Acetaminophen  Hives and Itching   Lipitor [Atorvastatin] Other (See Comments)    Aches and pains   Amoxicillin  Rash   Latex Itching    Social History:   Social History   Socioeconomic History   Marital status: Married    Spouse name: Kim   Number of children: 3   Years of education: Not on file   Highest education level: Not on file  Occupational History   Occupation: retired  Tobacco Use   Smoking status: Former    Types: Software engineer, Pipe    Quit date: 11/30/1999    Years since quitting: 24.6   Smokeless tobacco: Current    Types: Snuff   Tobacco comments:    stopped smoking pipe tobacco 30 years ago  Vaping Use   Vaping status: Never Used  Substance and Sexual Activity   Alcohol use: Yes    Comment: Occasional Beer    Drug use: Never   Sexual activity: Not on file  Other Topics Concern   Not on file  Social History Narrative   Not on file   Social Drivers of Health   Financial Resource Strain: Low Risk  (06/24/2022)   Received from [facility]   Overall Financial Resource Strain (CARDIA)  Food Insecurity: Low Risk  (09/21/2023)   Received from [facility]   Hunger Vital Sign    Within the past 12 months, you worried that your food would run out before you got money to buy more: Never true    Within the past 12 months, the food you bought just didn't last and you didn't have money to get more. : Never true  Transportation Needs: No Transportation Needs (09/21/2023)   Received from [facility]    In the past 12 months, has lack of reliable transportation kept you from medical appointments, meetings, work or from getting things needed for daily living? : No  Physical Activity: Sufficiently Active (06/24/2022)   Received from [facility], [facility] visits prior to 01/29/2023.   Exercise Vital Sign    On average, how many days per week do you engage in  moderate to strenuous exercise (like a brisk walk)?: 3 days    On average, how many minutes do you engage in exercise at this level?: 60 min  Stress: No Stress Concern Present (06/24/2022)   Received from [facility], [facility] visits prior to 01/29/2023.  [employer] of Occupational Health - Occupational Stress Questionnaire    Feeling of Stress : Only a little  Social Connections: Moderately Isolated (06/24/2022)   Received from [facility] visits prior to 01/29/2023., [facility]   Social Connection and Isolation Panel    In a typical week, how many times do you talk on the phone with family, friends, or neighbors?: More than three times a week    How often do you get together with friends or relatives?: More than three times a week    How often do you attend church or religious services?: Never    Do you belong to any clubs or organizations such as church groups, unions, fraternal or athletic groups, or school groups?: No    How often do you attend meetings of the clubs or organizations you belong to?: Never    Are you married, widowed, divorced, separated, never married, or living with a partner?: Married  Intimate Partner Violence: Not At Risk (01/25/2021)   Received from [facility] visits prior to 01/29/2023.   Humiliation, Afraid, Rape, and Kick questionnaire    Within the last year, have you been afraid of your partner or ex-partner?: No    Within the last year, have you been humiliated or emotionally abused in other ways by your partner or ex-partner?: No    Within the last year, have you been kicked, hit, slapped, or otherwise physically hurt by your partner or ex-partner?: No    Within the last year, have you been raped or forced to have any kind of sexual activity by your partner or ex-partner?: No    Family History:    Family History  Problem Relation Age of Onset   Rheum arthritis Mother    Heart  Problems Mother    Cancer Father    Hypertension Father    CAD Father    Blindness Father    Esophageal cancer Paternal Uncle    Colon cancer Neg Hx    Rectal cancer Neg Hx    Stomach cancer Neg Hx      ROS:  Please see the history of present illness.   All other ROS reviewed and negative.     Physical Exam/Data: Vitals:   07/06/24 1709 07/06/24 1800  BP: 120/69   Pulse: (!) 46   Resp: 18   Temp: 98.1 F (36.7 C)   TempSrc: Oral   SpO2: 100%   Weight:  77.6 kg   No intake or output data in the 24 hours ending 07/06/24 1924    07/06/2024    6:00 PM 05/17/2024    9:15 AM 08/23/2023    8:36 AM  Last 3 Weights  Weight (lbs) 171 lb 1.2 oz 167 lb 167 lb  Weight (kg) 77.6 kg 75.751 kg 75.751 kg     Body mass index is 27.61 kg/m.  General:  Well nourished, well developed, in no acute distress HEENT: normal Neck: no JVD Vascular: No carotid bruits; Distal pulses 2+ bilaterally Cardiac:  normal S1, S2; RRR; no murmur  Lungs:  clear to auscultation bilaterally, no wheezing, rhonchi or rales  Abd: soft, nontender, no hepatomegaly  Ext: no edema Musculoskeletal:  No deformities, BUE and BLE strength normal and equal Skin: warm and dry  Neuro:  CNs 2-12 intact, no focal abnormalities noted Psych:  Normal affect     Laboratory Data: High Sensitivity Troponin:  No results for input(s): TROPONINIHS in the last 720 hours.  ChemistryNo results for input(s): NA, K, CL, CO2, GLUCOSE, BUN, CREATININE, CALCIUM , MG, GFRNONAA, GFRAA, ANIONGAP in the last 168 hours.  No results for input(s): PROT, ALBUMIN , AST, ALT, ALKPHOS, BILITOT in the last 168 hours. Lipids No results for input(s): CHOL, TRIG, HDL, LABVLDL, LDLCALC, CHOLHDL in the last 168 hours.  HematologyNo results for input(s): WBC, RBC, HGB, HCT, MCV, MCH, MCHC, RDW, PLT in the last 168 hours. Thyroid  No results for input(s): TSH, FREET4 in the last 168  hours.  BNPNo results for input(s): BNP, PROBNP in the last 168 hours.  DDimer No results for input(s): DDIMER in the last 168 hours.  Radiology/Studies:  No results found.   Assessment and Plan: 1.CAD - history of CABG in 2021 LIMA-D2 LAD too small for grafting), SVG-D1, SVG-OM1, SVG-PDA - presented with initially intermittent left arm pain over the last week, which progressed to 8/10 mid to right chest pressing like feeling today lasting about 45 minutes - initially presented to [facility], seen by cardiology at [facility], on their evaluation concern given typical symptoms and new inferior and lateral precordial TWIs/ST depressions for unstable angina. He was transferred to [facility] with plans for cath Monday - EKG compared to 2023, has some chronic inferior and lateral precordial changes more prominent on admission.  - continue medical therapy, plan for cath Monday earlier if progresion of symptoms.  - intolerant to statins. Other medical therapy limited by chronic hypotension on midodrine , some low HRs at times.   2. PAF - has watchman device, not on anticoag  3. Elevated lipase - [id number] at [facility], have written to repeat. Message hospitlist to discuss.  [place] CT A/P: was benign    For questions or updates, please contact  [facility] Please consult [email or web address] for contact info under    Signed, Alvan Carrier, MD  07/06/2024 7:24 PM

## 2024-07-06 NOTE — Assessment & Plan Note (Addendum)
 Unstable angina,  Chest pain, not exertion related, with inferolateral ischemic changes on EKG. Prior history of CABG (reported not having angina when diagnosed with coronary artery disease) High sensitive troponin negative   08/10 EKG 49 bpm, left axis deviation, interventricular conduction delay, qtc 431, sinus rhythm with no significant ST segment changes, negative T wave lead I aVL, V5 and V6.   Echocardiogram with preserved LV systolic function EF 60 to 65%, no regional wall motion abnormalities, RV systolic function preserved, LA with mild dilatation, mild MR.   Patient was placed on IV heparin  for anticoagulation.  Antiplatelet therapy with aspirin  No statin due to diagnosis of myasthenia gravis.    No B blocker due to risk of bradycardia  08/11 cardiac catheterization, with severe native coronary artery disease, including 80% proximal LAD stenosis with competitive flow from LIMA D2, sequential 60%, 90% and mid 40% mid/distal LCx lesions, 90% OM1 and 99 OM2 stenoses, chronic occlusion of mid/ distal RCA.   Patient underwent successful bifurcation PCI of proximal/ mid LCx and OM1, drug eluding stent, in LCx and angioplasty balloon to OM1.  Recommendation to continue dual antiplatelet therapy aspirin  and prasugrel  for up to 12 months, consider switching to clopidogrel  to minimize risk if CYP2C19 genotype is favorable.   After one year would convert to clopidogrel  monotherapy, (SAPT) to complete 2 years.   Aggressive secondary prevention of CAD. Favof medical therapy of severe OM2 stenosis, which is not favorable for PCI.

## 2024-07-06 NOTE — Assessment & Plan Note (Signed)
Continue with paroxetine.

## 2024-07-06 NOTE — Assessment & Plan Note (Signed)
 Continue with midodrine  and fludrocortisone .

## 2024-07-06 NOTE — Progress Notes (Signed)
 PHARMACY - ANTICOAGULATION CONSULT NOTE  Pharmacy Consult for heparin  Indication: chest pain/ACS  Allergies  Allergen Reactions   Codeine Itching   Hydrocodone-Acetaminophen  Hives and Itching   Lipitor [Atorvastatin] Other (See Comments)    Aches and pains   Amoxicillin  Rash   Latex Itching    Patient Measurements: Weight: 77.6 kg (171 lb 1.2 oz)  Vital Signs: Temp: 98.1 F (36.7 C) (08/08 1709) Temp Source: Oral (08/08 1709) BP: 120/69 (08/08 1709) Pulse Rate: 46 (08/08 1709)  Labs: No results for input(s): HGB, HCT, PLT, APTT, LABPROT, INR, HEPARINUNFRC, HEPRLOWMOCWT, CREATININE, CKTOTAL, CKMB, TROPONINIHS in the last 72 hours.  CrCl cannot be calculated (Patient's most recent lab result is older than the maximum 21 days allowed.).   Medical History: Past Medical History:  Diagnosis Date   Abnormal cardiac CT angiography    Abnormal EKG 03/09/2018   Acquired hypothyroidism 03/07/2018   Allergy    ANA positive 10/02/2020   Formatting of this note might be different from the original.  Homogenous. Other tests negartive. Probably false.  See note 10/02/2020   Aortic atherosclerosis (HCC) 07/17/2020   Formatting of this note might be different from the original. Seen on CT [facility] 06/2019   Arthritis    Asthma    CAD (coronary artery disease), native coronary artery    Cataract    Chronic gout without tophus 03/07/2018   Coronary artery calcification seen on CAT scan 07/17/2020   Formatting of this note might be different from the original. 06/2019. [facility]   Coronary artery disease involving native coronary artery of native heart without angina pectoris 07/17/2020   Formatting of this note might be different from the original.  Seen on CT [facility] 06/2019     Formatting of this note might be different from the original.  Status post four-vessel bypass     Formatting of this note might be different from the original.  Status post four-vessel bypass 09/2020    Coronary artery disease of native artery of native heart with stable angina pectoris (HCC)    Cough syncope 1999   Dental caries    Depression 03/07/2018   Diplopia 10/11/2018   DISH (diffuse idiopathic skeletal hyperostosis) 07/17/2020   Formatting of this note might be different from the original. Seen on CT [facility] 06/2019   Dizzy 05/09/2018   Gastroesophageal reflux disease without esophagitis 03/07/2018   Formatting of this note might be different from the original. Long term   GERD (gastroesophageal reflux disease) 03/07/2018   Globus sensation 09/22/2022   Gout 03/07/2018   Hay fever    Heel spur, left 02/07/2020   History of asthma 11/06/2020   History of iron deficiency anemia 02/13/2022   History of smokeless tobacco use 07/17/2020   Formatting of this note might be different from the original.  Discussed chantix.  He wants to use. Did well in past. Off label, he understands.     Last Assessment & Plan:   Formatting of this note might be different from the original.  Patient has been without smokeless tobacco for 1 week now   Hyperlipidemia 03/07/2018   Hypomagnesemia 01/29/2021   Hypothyroidism (acquired) 03/07/2018   Formatting of this note might be different from the original. 1990s.   Mild cognitive impairment 07/01/2022   Mild episode of recurrent major depressive disorder (HCC) 07/17/2020   Mixed hyperlipidemia 03/07/2018   Myalgia due to statin 08/05/2021   Obesity    Obesity (BMI 30-39.9) 12/05/2020   Ocular myasthenia gravis (HCC)  07/17/2020   Formatting of this note might be different from the original. Ocular.  Follows at [facility].  Sees neurology and eye doctor.   Orthostatic hypotension 12/19/2020   PAF (paroxysmal atrial fibrillation) (HCC) 11/12/2021   Palpitation 03/08/2018   Plantar fasciitis 02/07/2020   Pneumonia    Presence of Watchman left atrial appendage closure device 11/12/2021   with 24mm Watchman FLX woth Dr. Cindie   Primary osteoarthritis involving  multiple joints 07/17/2020   Recurrent sinusitis    S/P CABG x 4 10/27/2020   Smokeless tobacco use 07/17/2020   Formatting of this note might be different from the original. Discussed chantix.  He wants to use. Did well in past. Off label, he understands.   Subcutaneous nodules 01/27/2021   Thrombus of left atrial appendage    Tightness of heel cord, left 02/07/2020   Type 2 diabetes mellitus (HCC) 03/07/2018   Type 2 diabetes mellitus without complication, without long-term current use of insulin  (HCC) 03/07/2018   Formatting of this note might be different from the original. 2014.   Vitamin B12 deficiency 09/25/2020    Medications:  Medications Prior to Admission  Medication Sig Dispense Refill Last Dose/Taking   acetaminophen  (TYLENOL ) 500 MG tablet Take 500-1,000 mg by mouth every 6 (six) hours as needed (for pain.).      albuterol  (PROVENTIL ) (2.5 MG/3ML) 0.083% nebulizer solution Take 2.5 mg by nebulization every 6 (six) hours as needed for wheezing or shortness of breath.      albuterol  (VENTOLIN  HFA) 108 (90 Base) MCG/ACT inhaler Inhale into the lungs every 6 (six) hours as needed for wheezing or shortness of breath.      aspirin  EC 81 MG tablet Take 81 mg by mouth daily.      Dextran 70-Hypromellose, PF, (ARTIFICIAL TEARS PF) 0.1-0.3 % SOLN Place 1 drop into both eyes 3 (three) times daily as needed (dry/irritated eyes.).      fludrocortisone  (FLORINEF ) 0.1 MG tablet TAKE 1 TABLET BY MOUTH DAILY. 90 tablet 0    levothyroxine  (SYNTHROID ) 125 MCG tablet Take 125 mcg by mouth daily.      Magnesium  Oxide 250 MG TABS Take 250 mg by mouth daily.      metFORMIN (GLUCOPHAGE) 1000 MG tablet Take 1,000 mg by mouth 2 (two) times daily with a meal.      midodrine  (PROAMATINE ) 5 MG tablet Take 1 tablet (5 mg total) by mouth 3 (three) times daily with meals. 270 tablet 3    mycophenolate  (CELLCEPT ) 500 MG tablet Take 1,500 mg by mouth 2 (two) times daily.      Omega-3 Fatty Acids (FISH OIL  ULTRA) 1400 MG CAPS Take 1,400 mg by mouth in the morning and at bedtime. (Patient taking differently: Take 1,400 mg by mouth daily.)      OZEMPIC, 1 MG/DOSE, 4 MG/3ML SOPN Inject 1 mg into the skin once a week.      pantoprazole  (PROTONIX ) 40 MG tablet Take 40 mg by mouth daily.      PARoxetine  (PAXIL ) 40 MG tablet Take 40 mg by mouth daily.      pyridostigmine  (MESTINON ) 60 MG tablet Take 90 mg by mouth 4 (four) times daily. Take 1 1/2 tablet (90 mg) 4 times daily (Patient taking differently: Take 120 mg by mouth 4 (four) times daily. Take 2 tablets (120 mg) 4 times daily)      vitamin B-12 (CYANOCOBALAMIN) 1000 MCG tablet Take 1,000 mcg by mouth daily.      Scheduled:  aspirin  EC  81 mg Oral Daily   fludrocortisone   100 mcg Oral Daily   levothyroxine   125 mcg Oral Daily   [START ON 07/07/2024] midodrine   5 mg Oral TID WC   mycophenolate   1,500 mg Oral BID   pantoprazole   40 mg Oral Daily   PARoxetine   40 mg Oral Daily   pyridostigmine   120 mg Oral QID    Assessment: Pt with a hx of PAF but not on AC. Presented to [facility] for CP. He got 1 dose of lovenox  80mg  x1 around noon. Plan to transition to IV heparin  here.   Hgb 13.6 Plt 158k Scr 0.8  Goal of Therapy:  Heparin  level 0.3-0.7 units/ml Monitor platelets by anticoagulation protocol: Yes   Plan:  Heparin  1150 units/hr - start 0000 6 hr HL then daily F/u plan need for cath  Sergio Batch, PharmD, BCIDP, AAHIVP, CPP Infectious Disease Pharmacist 07/06/2024 6:59 PM

## 2024-07-07 ENCOUNTER — Inpatient Hospital Stay (HOSPITAL_COMMUNITY)

## 2024-07-07 DIAGNOSIS — R079 Chest pain, unspecified: Secondary | ICD-10-CM | POA: Diagnosis not present

## 2024-07-07 DIAGNOSIS — E1169 Type 2 diabetes mellitus with other specified complication: Secondary | ICD-10-CM | POA: Diagnosis not present

## 2024-07-07 DIAGNOSIS — I951 Orthostatic hypotension: Secondary | ICD-10-CM | POA: Diagnosis not present

## 2024-07-07 DIAGNOSIS — I200 Unstable angina: Secondary | ICD-10-CM | POA: Diagnosis not present

## 2024-07-07 DIAGNOSIS — I480 Paroxysmal atrial fibrillation: Secondary | ICD-10-CM | POA: Diagnosis not present

## 2024-07-07 DIAGNOSIS — I25118 Atherosclerotic heart disease of native coronary artery with other forms of angina pectoris: Secondary | ICD-10-CM | POA: Diagnosis not present

## 2024-07-07 LAB — BASIC METABOLIC PANEL WITH GFR
Anion gap: 7 (ref 5–15)
BUN: 14 mg/dL (ref 8–23)
CO2: 24 mmol/L (ref 22–32)
Calcium: 8.9 mg/dL (ref 8.9–10.3)
Chloride: 109 mmol/L (ref 98–111)
Creatinine, Ser: 0.94 mg/dL (ref 0.61–1.24)
GFR, Estimated: >60 mL/min (ref >60)
Glucose, Bld: 121 mg/dL — ABNORMAL HIGH (ref 70–99)
Potassium: 3.8 mmol/L (ref 3.5–5.1)
Sodium: 140 mmol/L (ref 135–145)

## 2024-07-07 LAB — ECHOCARDIOGRAM COMPLETE
AR max vel: 1.85 cm2
AV Area VTI: 1.81 cm2
AV Area mean vel: 1.98 cm2
AV Mean grad: 5.5 mmHg
AV Peak grad: 12.1 mmHg
Ao pk vel: 1.74 m/s
Area-P 1/2: 2.87 cm2
Height: 66 in
MV M vel: 4.83 m/s
MV Peak grad: 93.3 mmHg
S' Lateral: 3.8 cm
Weight: 2804.25 [oz_av]

## 2024-07-07 LAB — HEPATIC FUNCTION PANEL
ALT: 87 U/L — ABNORMAL HIGH (ref 0–44)
AST: 103 U/L — ABNORMAL HIGH (ref 15–41)
Albumin: 3.3 g/dL — ABNORMAL LOW (ref 3.5–5.0)
Alkaline Phosphatase: 86 U/L (ref 38–126)
Bilirubin, Direct: 0.1 mg/dL (ref 0.0–0.2)
Indirect Bilirubin: 0.3 mg/dL (ref 0.3–0.9)
Total Bilirubin: 0.4 mg/dL (ref 0.0–1.2)
Total Protein: 5.6 g/dL — ABNORMAL LOW (ref 6.5–8.1)

## 2024-07-07 LAB — CBC
HCT: 36.8 % — ABNORMAL LOW (ref 39.0–52.0)
Hemoglobin: 12.1 g/dL — ABNORMAL LOW (ref 13.0–17.0)
MCH: 29.7 pg (ref 26.0–34.0)
MCHC: 32.9 g/dL (ref 30.0–36.0)
MCV: 90.2 fL (ref 80.0–100.0)
Platelets: 149 K/uL — ABNORMAL LOW (ref 150–400)
RBC: 4.08 MIL/uL — ABNORMAL LOW (ref 4.22–5.81)
RDW: 14 % (ref 11.5–15.5)
WBC: 6.2 K/uL (ref 4.0–10.5)
nRBC: 0 % (ref 0.0–0.2)

## 2024-07-07 LAB — HEPARIN LEVEL (UNFRACTIONATED)
Heparin Unfractionated: 0.18 [IU]/mL — ABNORMAL LOW (ref 0.30–0.70)
Heparin Unfractionated: 0.68 [IU]/mL (ref 0.30–0.70)

## 2024-07-07 LAB — LIPASE, BLOOD: Lipase: 137 U/L — ABNORMAL HIGH (ref 11–51)

## 2024-07-07 MED ORDER — HEPARIN BOLUS VIA INFUSION
2400.0000 [IU] | Freq: Once | INTRAVENOUS | Status: AC
  Administered 2024-07-07: 2400 [IU] via INTRAVENOUS
  Filled 2024-07-07: qty 2400

## 2024-07-07 NOTE — Progress Notes (Signed)
 Progress Note   Patient: Derek Moore DOB: 04-21-1956 DOA: 07/06/2024     1 DOS: the patient was seen and examined on 07/07/2024   Brief hospital course: Derek Moore was admitted to the hospital with the working diagnosis of chest pain, acute coronary syndrome.    68 y.o. male with medical history significant of myasthenia gravis, T2DM, coronary artery disease, CABG 2021, atrial fibrillation, who presented with chest pain.  Reported acute chest pain, severe in intensity, pressure like, substernal to epigastric/ right upper quadrant in location, associated with dyspnea, nausea and diaphoresis. Radiated to the left arm.  Symptoms resolved spontaneously after 30 to 45 minutes.  Apparently he had intermittent left arm pain for one week prior, that occurred intermittently at rest and on exertion.  He was transferred from [facility] ED to [facility] for cardiac evaluation.  His vital signs at the time of transfer had a blood pressure of 120/69, HR 46, RR 18 and 02 saturation 100%  Cardiovascular with S1 and S2 present and regular with no gallops, rubs or murmurs Respiratory with no rales or rhonchi, no wheezing Abdomen with no distention  No lower extremity edema.  Na 140, K 4.0 Cl 107 bicarbonate 27 glucose 181 bun 16 cr 0,60  Wbc 5.0 hgb 13,6 plt 158  D dimer 0,29  Troponin I < 0.01, 0.02   BNP 168  AST 73 ALT 29  total bilirubin 0,9 ALK P 85  Lipase 8,872    Chest radiograph result no acute changes CT abdomen and pelvis negative for acute intra abdominal or pelvic pathology. Pancrease negative    10:08 EKG 56 bpm, normal axis, qtc 508, sinus rhythm with PAC, poor RR wave progression, ST depression II, III, aVF, negative T wave lead II, III, avF  12:08 EKG 51 bpm, normal axis, qtc 504, sinus rhythm, poor RR wave progression, ST depression in lead II, III, avF, negative T wave lead I, aVL, V4 to V6.  Patient was place on heparin  drip for anticoagulation and plan for cardiac  catheterization.   Assessment and Plan: * CAD (coronary artery disease), native coronary artery Chest pain, not exertion related, with inferolateral ischemic changes on EKG. Prior history of CABG (reported not having angina when diagnosed with coronary artery disease)  High sensitive troponin negative  Currently chest pain free.   Antiplatelet therapy with aspirin  No statin due to diagnosis of myasthenia gravis.   Anticoagulation with heparin  drip for now  Hold on B blocker due to risk of bradycardia As needed nitroglycerin  and hydromorphone  in case of chest pain.  Follow up on echocardiogram.   PAF (paroxysmal atrial fibrillation) (HCC) Patient currently sinus rhythm, not on AV blockade.  Sp left atrial appendage closure device, not on anticoagulation.  Continue telemetry monitoring.   Orthostatic hypotension Continue with midodrine  and fludrocortisone .   Type 2 diabetes mellitus with hyperlipidemia (HCC) Hold on metformin and GLP 1 agonist.  Fasting glucose is 121 mg/dl  Will hold on insulin  therapy for now.   Acquired hypothyroidism Continue levothyroxine    GERD (gastroesophageal reflux disease) Continue pantoprazole    Myasthenia gravis (HCC) Continue with pyridostigmine , no signs of acute decompensation.  Continue with mycophenolate .   Depression Continue with paroxetine       Subjective: patient overall with no significant chest pain, no dyspnea, no PND, edema or orthopnea   Physical Exam: Vitals:   07/07/24 0002 07/07/24 0535 07/07/24 0811 07/07/24 1205  BP: 110/69 125/76 117/74 118/68  Pulse: (!) 58 (!) 50 (!) 49 ROLLEN)  49  Resp: 13 15 19 16   Temp: 97.9 F (36.6 C) 98.2 F (36.8 C) 98 F (36.7 C) 97.8 F (36.6 C)  TempSrc: Oral Oral Oral Oral  SpO2: 97% 97% 98% 99%  Weight:  79.5 kg    Height:       Neurology awake and alert ENT with no pallor Cardiovascular with S1 and S2 present and regular with no gallops, rubs or murmurs No JVD Respiratory  with no rales or wheezing, no rhonchi  Abdomen with no distention  No lower extremity edema  Data Reviewed:    Family Communication: I spoke with patient's wife at the bedside, we talked in detail about patient's condition, plan of care and prognosis and all questions were addressed.   Disposition: Status is: Inpatient Remains inpatient appropriate because: IV heparin    Planned Discharge Destination: Home     Author: Elidia Toribio Furnace, MD 07/07/2024 2:03 PM  For on call review [email or web address].

## 2024-07-07 NOTE — Progress Notes (Signed)
 PHARMACY - ANTICOAGULATION CONSULT NOTE  Pharmacy Consult for heparin  Indication: chest pain/ACS  Allergies  Allergen Reactions   Codeine Itching   Hydrocodone-Acetaminophen  Hives and Itching   Lipitor [Atorvastatin] Other (See Comments)    Myalgia--all statins per pt   Amoxicillin  Rash   Latex Itching    Patient Measurements: Height: 5' 6 (167.6 cm) Weight: 79.5 kg (175 lb 4.3 oz) IBW/kg (Calculated) : 63.8  Vital Signs: Temp: 98 F (36.7 C) (08/09 0811) Temp Source: Oral (08/09 0811) BP: 117/74 (08/09 0811) Pulse Rate: 49 (08/09 0811)  Labs: Recent Labs    07/06/24 1854 07/06/24 2040 07/06/24 2222 07/07/24 0256  HGB  --   --   --  12.1*  HCT  --   --   --  36.8*  PLT  --   --   --  149*  HEPARINUNFRC  --   --   --  0.18*  CREATININE  --   --   --  0.94  TROPONINIHS 6 6 7   --     Estimated Creatinine Clearance: 73.5 mL/min (by C-G formula based on SCr of 0.94 mg/dL).   Medical History: Past Medical History:  Diagnosis Date   Abnormal cardiac CT angiography    Abnormal EKG 03/09/2018   Acquired hypothyroidism 03/07/2018   Allergy    ANA positive 10/02/2020   Formatting of this note might be different from the original.  Homogenous. Other tests negartive. Probably false.  See note 10/02/2020   Aortic atherosclerosis (HCC) 07/17/2020   Formatting of this note might be different from the original. Seen on CT [facility] 06/2019   Arthritis    Asthma    CAD (coronary artery disease), native coronary artery    Cataract    Chronic gout without tophus 03/07/2018   Coronary artery calcification seen on CAT scan 07/17/2020   Formatting of this note might be different from the original. 06/2019. [facility]   Coronary artery disease involving native coronary artery of native heart without angina pectoris 07/17/2020   Formatting of this note might be different from the original.  Seen on CT [facility] 06/2019     Formatting of this note might be different from the original.  Status post  four-vessel bypass     Formatting of this note might be different from the original.  Status post four-vessel bypass 09/2020   Coronary artery disease of native artery of native heart with stable angina pectoris (HCC)    Cough syncope 1999   Dental caries    Depression 03/07/2018   Diplopia 10/11/2018   DISH (diffuse idiopathic skeletal hyperostosis) 07/17/2020   Formatting of this note might be different from the original. Seen on CT [facility] 06/2019   Dizzy 05/09/2018   Gastroesophageal reflux disease without esophagitis 03/07/2018   Formatting of this note might be different from the original. Long term   GERD (gastroesophageal reflux disease) 03/07/2018   Globus sensation 09/22/2022   Gout 03/07/2018   Hay fever    Heel spur, left 02/07/2020   History of asthma 11/06/2020   History of iron deficiency anemia 02/13/2022   History of smokeless tobacco use 07/17/2020   Formatting of this note might be different from the original.  Discussed chantix.  He wants to use. Did well in past. Off label, he understands.     Last Assessment & Plan:   Formatting of this note might be different from the original.  Patient has been without smokeless tobacco for 1 week now  Hyperlipidemia 03/07/2018   Hypomagnesemia 01/29/2021   Hypothyroidism (acquired) 03/07/2018   Formatting of this note might be different from the original. 1990s.   Mild cognitive impairment 07/01/2022   Mild episode of recurrent major depressive disorder (HCC) 07/17/2020   Mixed hyperlipidemia 03/07/2018   Myalgia due to statin 08/05/2021   Obesity    Obesity (BMI 30-39.9) 12/05/2020   Ocular myasthenia gravis (HCC) 07/17/2020   Formatting of this note might be different from the original. Ocular.  Follows at [facility].  Sees neurology and eye doctor.   Orthostatic hypotension 12/19/2020   PAF (paroxysmal atrial fibrillation) (HCC) 11/12/2021   Palpitation 03/08/2018   Plantar fasciitis 02/07/2020   Pneumonia    Presence of  Watchman left atrial appendage closure device 11/12/2021   with 24mm Watchman FLX woth Dr. Cindie   Primary osteoarthritis involving multiple joints 07/17/2020   Recurrent sinusitis    S/P CABG x 4 10/27/2020   Smokeless tobacco use 07/17/2020   Formatting of this note might be different from the original. Discussed chantix.  He wants to use. Did well in past. Off label, he understands.   Subcutaneous nodules 01/27/2021   Thrombus of left atrial appendage    Tightness of heel cord, left 02/07/2020   Type 2 diabetes mellitus (HCC) 03/07/2018   Type 2 diabetes mellitus without complication, without long-term current use of insulin  (HCC) 03/07/2018   Formatting of this note might be different from the original. 2014.   Vitamin B12 deficiency 09/25/2020    Medications:  Medications Prior to Admission  Medication Sig Dispense Refill Last Dose/Taking   acetaminophen  (TYLENOL ) 500 MG tablet Take 500-1,000 mg by mouth every 6 (six) hours as needed (for pain.).   Past Month   albuterol  (PROVENTIL ) (2.5 MG/3ML) 0.083% nebulizer solution Take 2.5 mg by nebulization every 6 (six) hours as needed for wheezing or shortness of breath.   Taking As Needed   albuterol  (VENTOLIN  HFA) 108 (90 Base) MCG/ACT inhaler Inhale into the lungs every 6 (six) hours as needed for wheezing or shortness of breath.   Taking As Needed   aspirin  EC 81 MG tablet Take 81 mg by mouth at bedtime.   07/05/2024   Dextran 70-Hypromellose, PF, (ARTIFICIAL TEARS PF) 0.1-0.3 % SOLN Place 1 drop into both eyes 3 (three) times daily as needed (dry/irritated eyes.).   Past Week   fludrocortisone  (FLORINEF ) 0.1 MG tablet TAKE 1 TABLET BY MOUTH DAILY. 90 tablet 0 07/06/2024   levothyroxine  (SYNTHROID ) 137 MCG tablet Take 137 mcg by mouth daily.   07/06/2024   Magnesium  Oxide 250 MG TABS Take 250 mg by mouth daily.   07/06/2024   metFORMIN (GLUCOPHAGE) 1000 MG tablet Take 1,000 mg by mouth 2 (two) times daily with a meal.   07/06/2024   midodrine   (PROAMATINE ) 5 MG tablet Take 1 tablet (5 mg total) by mouth 3 (three) times daily with meals. 270 tablet 3 07/06/2024   mycophenolate  (CELLCEPT ) 500 MG tablet Take 1,500 mg by mouth 2 (two) times daily.   07/06/2024   Omega-3 Fatty Acids (FISH OIL ULTRA) 1400 MG CAPS Take 1,400 mg by mouth in the morning and at bedtime. (Patient taking differently: Take 1,400 mg by mouth daily.)   07/06/2024   pantoprazole  (PROTONIX ) 40 MG tablet Take 40 mg by mouth at bedtime.   07/05/2024   PARoxetine  (PAXIL ) 40 MG tablet Take 40 mg by mouth daily.   07/06/2024   pyridostigmine  (MESTINON ) 60 MG tablet Take 90 mg by mouth  4 (four) times daily. Take 1 1/2 tablet (90 mg) 4 times daily (Patient taking differently: Take 120 mg by mouth 4 (four) times daily. Take 2 tablets (120 mg) 4 times daily)   07/06/2024   vitamin B-12 (CYANOCOBALAMIN) 1000 MCG tablet Take 1,000 mcg by mouth daily.   07/06/2024   OZEMPIC, 1 MG/DOSE, 4 MG/3ML SOPN Inject 1 mg into the skin once a week. (Patient not taking: Reported on 07/06/2024)   Not Taking   Scheduled:   aspirin  EC  81 mg Oral Daily   fludrocortisone   100 mcg Oral Daily   levothyroxine   125 mcg Oral Daily   midodrine   5 mg Oral TID WC   mycophenolate   1,500 mg Oral BID   pantoprazole   40 mg Oral Daily   PARoxetine   40 mg Oral Daily   pyridostigmine   120 mg Oral QID    Assessment: Pt with a hx of PAF but not on AC. Presented to [facility] for CP. He got 1 dose of lovenox  80mg  x1 around noon on 08/08. Plan to transition to IV heparin  here.   08/09 AM update: Heparin  level subtherapeutic today at 0.18 as expected given no bolus dose given. No s/sx of bleeding or pauses per RN.   Hgb 12.1 Hct 36.8 Plt 149 Scr 0.94  Goal of Therapy:  Heparin  level 0.3-0.7 units/ml Monitor platelets by anticoagulation protocol: Yes   Plan:  Heparin  Bolus 2400 units Increase Heparin  rate to 1400 units/hr 8 hour heparin  level  Daily CBC and heparin  level Continue to monitor for s/sx of  bleeding Plan for cath on Monday 08/11   R. Samual Satterfield, PharmD PGY-1 Acute Care Pharmacy Resident [facility] 07/07/2024 9:10 AM

## 2024-07-07 NOTE — Progress Notes (Signed)
  Echocardiogram 2D Echocardiogram has been performed.  Derek Moore 07/07/2024, 5:38 PM

## 2024-07-07 NOTE — Progress Notes (Signed)
 PHARMACY - ANTICOAGULATION CONSULT NOTE  Pharmacy Consult for heparin  Indication: chest pain/ACS  Allergies  Allergen Reactions   Codeine Itching   Hydrocodone-Acetaminophen  Hives and Itching   Lipitor [Atorvastatin] Other (See Comments)    Myalgia--all statins per pt   Amoxicillin  Rash   Latex Itching    Patient Measurements: Height: 5' 6 (167.6 cm) Weight: 79.5 kg (175 lb 4.3 oz) IBW/kg (Calculated) : 63.8  Vital Signs: Temp: 98.3 F (36.8 C) (08/09 1940) Temp Source: Oral (08/09 1940) BP: 123/61 (08/09 1940) Pulse Rate: 51 (08/09 1940)  Labs: Recent Labs    07/06/24 1854 07/06/24 2040 07/06/24 2222 07/07/24 0256 07/07/24 2211  HGB  --   --   --  12.1*  --   HCT  --   --   --  36.8*  --   PLT  --   --   --  149*  --   HEPARINUNFRC  --   --   --  0.18* 0.68  CREATININE  --   --   --  0.94  --   TROPONINIHS 6 6 7   --   --     Estimated Creatinine Clearance: 73.5 mL/min (by C-G formula based on SCr of 0.94 mg/dL).   Medical History: Past Medical History:  Diagnosis Date   Abnormal cardiac CT angiography    Abnormal EKG 03/09/2018   Acquired hypothyroidism 03/07/2018   Allergy    ANA positive 10/02/2020   Formatting of this note might be different from the original.  Homogenous. Other tests negartive. Probably false.  See note 10/02/2020   Aortic atherosclerosis (HCC) 07/17/2020   Formatting of this note might be different from the original. Seen on CT [facility] 06/2019   Arthritis    Asthma    CAD (coronary artery disease), native coronary artery    Cataract    Chronic gout without tophus 03/07/2018   Coronary artery calcification seen on CAT scan 07/17/2020   Formatting of this note might be different from the original. 06/2019. [facility]   Coronary artery disease involving native coronary artery of native heart without angina pectoris 07/17/2020   Formatting of this note might be different from the original.  Seen on CT [facility] 06/2019     Formatting of this note might  be different from the original.  Status post four-vessel bypass     Formatting of this note might be different from the original.  Status post four-vessel bypass 09/2020   Coronary artery disease of native artery of native heart with stable angina pectoris (HCC)    Cough syncope 1999   Dental caries    Depression 03/07/2018   Diplopia 10/11/2018   DISH (diffuse idiopathic skeletal hyperostosis) 07/17/2020   Formatting of this note might be different from the original. Seen on CT [facility] 06/2019   Dizzy 05/09/2018   Gastroesophageal reflux disease without esophagitis 03/07/2018   Formatting of this note might be different from the original. Long term   GERD (gastroesophageal reflux disease) 03/07/2018   Globus sensation 09/22/2022   Gout 03/07/2018   Hay fever    Heel spur, left 02/07/2020   History of asthma 11/06/2020   History of iron deficiency anemia 02/13/2022   History of smokeless tobacco use 07/17/2020   Formatting of this note might be different from the original.  Discussed chantix.  He wants to use. Did well in past. Off label, he understands.     Last Assessment & Plan:   Formatting of this note might  be different from the original.  Patient has been without smokeless tobacco for 1 week now   Hyperlipidemia 03/07/2018   Hypomagnesemia 01/29/2021   Hypothyroidism (acquired) 03/07/2018   Formatting of this note might be different from the original. 1990s.   Mild cognitive impairment 07/01/2022   Mild episode of recurrent major depressive disorder (HCC) 07/17/2020   Mixed hyperlipidemia 03/07/2018   Myalgia due to statin 08/05/2021   Obesity    Obesity (BMI 30-39.9) 12/05/2020   Ocular myasthenia gravis (HCC) 07/17/2020   Formatting of this note might be different from the original. Ocular.  Follows at [facility].  Sees neurology and eye doctor.   Orthostatic hypotension 12/19/2020   PAF (paroxysmal atrial fibrillation) (HCC) 11/12/2021   Palpitation 03/08/2018   Plantar fasciitis  02/07/2020   Pneumonia    Presence of Watchman left atrial appendage closure device 11/12/2021   with 24mm Watchman FLX woth Dr. Cindie   Primary osteoarthritis involving multiple joints 07/17/2020   Recurrent sinusitis    S/P CABG x 4 10/27/2020   Smokeless tobacco use 07/17/2020   Formatting of this note might be different from the original. Discussed chantix.  He wants to use. Did well in past. Off label, he understands.   Subcutaneous nodules 01/27/2021   Thrombus of left atrial appendage    Tightness of heel cord, left 02/07/2020   Type 2 diabetes mellitus (HCC) 03/07/2018   Type 2 diabetes mellitus without complication, without long-term current use of insulin  (HCC) 03/07/2018   Formatting of this note might be different from the original. 2014.   Vitamin B12 deficiency 09/25/2020    Medications:  Medications Prior to Admission  Medication Sig Dispense Refill Last Dose/Taking   acetaminophen  (TYLENOL ) 500 MG tablet Take 500-1,000 mg by mouth every 6 (six) hours as needed (for pain.).   Past Month   albuterol  (PROVENTIL ) (2.5 MG/3ML) 0.083% nebulizer solution Take 2.5 mg by nebulization every 6 (six) hours as needed for wheezing or shortness of breath.   Taking As Needed   albuterol  (VENTOLIN  HFA) 108 (90 Base) MCG/ACT inhaler Inhale into the lungs every 6 (six) hours as needed for wheezing or shortness of breath.   Taking As Needed   aspirin  EC 81 MG tablet Take 81 mg by mouth at bedtime.   07/05/2024   Dextran 70-Hypromellose, PF, (ARTIFICIAL TEARS PF) 0.1-0.3 % SOLN Place 1 drop into both eyes 3 (three) times daily as needed (dry/irritated eyes.).   Past Week   fludrocortisone  (FLORINEF ) 0.1 MG tablet TAKE 1 TABLET BY MOUTH DAILY. 90 tablet 0 07/06/2024   levothyroxine  (SYNTHROID ) 137 MCG tablet Take 137 mcg by mouth daily.   07/06/2024   Magnesium  Oxide 250 MG TABS Take 250 mg by mouth daily.   07/06/2024   metFORMIN (GLUCOPHAGE) 1000 MG tablet Take 1,000 mg by mouth 2 (two) times  daily with a meal.   07/06/2024   midodrine  (PROAMATINE ) 5 MG tablet Take 1 tablet (5 mg total) by mouth 3 (three) times daily with meals. 270 tablet 3 07/06/2024   mycophenolate  (CELLCEPT ) 500 MG tablet Take 1,500 mg by mouth 2 (two) times daily.   07/06/2024   Omega-3 Fatty Acids (FISH OIL ULTRA) 1400 MG CAPS Take 1,400 mg by mouth in the morning and at bedtime. (Patient taking differently: Take 1,400 mg by mouth daily.)   07/06/2024   pantoprazole  (PROTONIX ) 40 MG tablet Take 40 mg by mouth at bedtime.   07/05/2024   PARoxetine  (PAXIL ) 40 MG tablet Take 40 mg  by mouth daily.   07/06/2024   pyridostigmine  (MESTINON ) 60 MG tablet Take 90 mg by mouth 4 (four) times daily. Take 1 1/2 tablet (90 mg) 4 times daily (Patient taking differently: Take 120 mg by mouth 4 (four) times daily. Take 2 tablets (120 mg) 4 times daily)   07/06/2024   vitamin B-12 (CYANOCOBALAMIN) 1000 MCG tablet Take 1,000 mcg by mouth daily.   07/06/2024   OZEMPIC, 1 MG/DOSE, 4 MG/3ML SOPN Inject 1 mg into the skin once a week. (Patient not taking: Reported on 07/06/2024)   Not Taking   Scheduled:   aspirin  EC  81 mg Oral Daily   fludrocortisone   100 mcg Oral Daily   levothyroxine   125 mcg Oral Daily   midodrine   5 mg Oral TID WC   mycophenolate   1,500 mg Oral BID   pantoprazole   40 mg Oral Daily   PARoxetine   40 mg Oral Daily   pyridostigmine   120 mg Oral QID    Assessment: Pt with a hx of PAF but not on AC. Presented to [facility] for CP. He got 1 dose of lovenox  80mg  x1 around noon on 08/08. Plan to transition to IV heparin  here.   Heparin  level  0.68 at top of range after heparin  bolus and increase drip rate to 1400 uts/hr  No bleeding noted  Goal of Therapy:  Heparin  level 0.3-0.7 units/ml Monitor platelets by anticoagulation protocol: Yes   Plan:  Decrease heparin  drip 1300 uts/hr  Daily CBC and heparin  level Continue to monitor for s/sx of bleeding Plan for cath on Monday 08/11    Olam Chalk Pharm.D. CPP,  BCPS Clinical Pharmacist [phone] 07/07/2024 10:52 PM

## 2024-07-07 NOTE — Hospital Course (Signed)
 Mr. Olivencia was admitted to the hospital with the working diagnosis of chest pain, with acute coronary syndrome.   68 y.o. male with medical history significant of myasthenia gravis, T2DM, coronary artery disease, CABG 2021, atrial fibrillation, who presented with chest pain.  Reported acute chest pain, severe in intensity, pressure like, substernal to epigastric/ right upper quadrant in location, associated with dyspnea, nausea and diaphoresis. Radiated to the left arm.  Symptoms resolved spontaneously after 30 to 45 minutes.  Apparently he had intermittent left arm pain for one week prior, that occurred intermittently at rest and on exertion.  He was transferred from [facility] ED to [facility] for cardiac evaluation.  His vital signs at the time of transfer had a blood pressure of 120/69, HR 46, RR 18 and 02 saturation 100%  Cardiovascular with S1 and S2 present and regular with no gallops, rubs or murmurs Respiratory with no rales or rhonchi, no wheezing Abdomen with no distention  No lower extremity edema.  Na 140, K 4.0 Cl 107 bicarbonate 27 glucose 181 bun 16 cr 0,60  Wbc 5.0 hgb 13,6 plt 158  D dimer 0,29  Troponin I < 0.01, 0.02   BNP 168  AST 73 ALT 29  total bilirubin 0,9 ALK P 85  Lipase 8,872    Chest radiograph result no acute changes CT abdomen and pelvis negative for acute intra abdominal or pelvic pathology. Pancrease negative    10:08 EKG 56 bpm, normal axis, qtc 508, sinus rhythm with PAC, poor RR wave progression, ST depression II, III, aVF, negative T wave lead II, III, avF  12:08 EKG 51 bpm, normal axis, qtc 504, sinus rhythm, poor RR wave progression, ST depression in lead II, III, avF, negative T wave lead I, aVL, V4 to V6.  Patient was place on heparin  drip for anticoagulation and plan for cardiac catheterization.   08/11 cardiac catheterization, underwent PCI of proximal/mid LCx and OM1.  08/12 discharge home, continue medical therapy including dual antiplatelet  therapy.

## 2024-07-07 NOTE — Progress Notes (Signed)
 Rounding Note   Patient Name: KERRON SEDANO Date of Encounter: 07/07/2024  [facility] Cardiologist: Kardie Tobb, DO   Subjective No complaints  Scheduled Meds:  aspirin  EC  81 mg Oral Daily   fludrocortisone   100 mcg Oral Daily   levothyroxine   125 mcg Oral Daily   midodrine   5 mg Oral TID WC   mycophenolate   1,500 mg Oral BID   pantoprazole   40 mg Oral Daily   PARoxetine   40 mg Oral Daily   pyridostigmine   120 mg Oral QID   Continuous Infusions:  heparin  1,150 Units/hr (07/07/24 0001)   PRN Meds: albuterol , HYDROmorphone  (DILAUDID ) injection, nitroGLYCERIN , ondansetron  **OR** ondansetron  (ZOFRAN ) IV   Vital Signs  Vitals:   07/06/24 1958 07/06/24 2138 07/07/24 0002 07/07/24 0535  BP: (!) 106/57  110/69 125/76  Pulse: (!) 51  (!) 58 (!) 50  Resp: 10  13 15   Temp: 98 F (36.7 C)  97.9 F (36.6 C) 98.2 F (36.8 C)  TempSrc: Oral  Oral Oral  SpO2: 97%  97% 97%  Weight:    79.5 kg  Height:  5' 6 (1.676 m)     No intake or output data in the 24 hours ending 07/07/24 0805    07/07/2024    5:35 AM 07/06/2024    6:00 PM 05/17/2024    9:15 AM  Last 3 Weights  Weight (lbs) 175 lb 4.3 oz 171 lb 1.2 oz 167 lb  Weight (kg) 79.5 kg 77.6 kg 75.751 kg      Telemetry SR and sinus brady - Personally Reviewed  ECG  N/a - Personally Reviewed  Physical Exam  GEN: No acute distress.   Neck: No JVD Cardiac: RRR, no murmurs, rubs, or gallops.  Respiratory: Clear to auscultation bilaterally. GI: Soft, nontender, non-distended  MS: No edema; No deformity. Neuro:  Nonfocal  Psych: Normal affect   Labs High Sensitivity Troponin:   Recent Labs  Lab 07/06/24 1854 07/06/24 2040 07/06/24 2222  TROPONINIHS 6 6 7      Chemistry Recent Labs  Lab 07/07/24 0256  NA 140  K 3.8  CL 109  CO2 24  GLUCOSE 121*  BUN 14  CREATININE 0.94  CALCIUM  8.9  PROT 5.6*  ALBUMIN  3.3*  AST 103*  ALT 87*  ALKPHOS 86  BILITOT 0.4  GFRNONAA >60  ANIONGAP 7     Lipids No results for input(s): CHOL, TRIG, HDL, LABVLDL, LDLCALC, CHOLHDL in the last 168 hours.  Hematology Recent Labs  Lab 07/07/24 0256  WBC 6.2  RBC 4.08*  HGB 12.1*  HCT 36.8*  MCV 90.2  MCH 29.7  MCHC 32.9  RDW 14.0  PLT 149*   Thyroid  No results for input(s): TSH, FREET4 in the last 168 hours.  BNPNo results for input(s): BNP, PROBNP in the last 168 hours.  DDimer No results for input(s): DDIMER in the last 168 hours.   Radiology  No results found.    Patient Profile   REMBERT BROWE is a 68 y.o. male with a hx of CAD with prior CABG, PAF with watchman device,  who is being seen 07/06/2024 for the evaluation of chest pain at the request of Dr Noralee.   Assessment & Plan  1.CAD - history of CABG in 2021 LIMA-D2 LAD too small for grafting), SVG-D1, SVG-OM1, SVG-PDA - presented with initially intermittent left arm pain over the last week, which progressed to 8/10 mid to right chest pressing like feeling on day of admission lasting  about 45 minutes - initially presented to [facility], seen by cardiology at [facility], on their evaluation concern given typical symptoms and new inferior and lateral precordial TWIs/ST depressions for unstable angina. He was transferred to [facility] with plans for cath Monday - EKG compared to 2023, has some chronic inferior and lateral precordial changes more prominent on admission.  - continue medical therapy, plan for cath Monday earlier if progresion of symptoms.  - intolerant to statins. Other medical therapy limited by chronic hypotension on midodrine , some low HRs at times.  - f/u echo - check lipid panel  -medical therapy with ASA 81, hep gtt   2. PAF - has watchman device, not on anticoag   3. Elevated lipase - 8872 at [facility], repeat here mildly elevated at [address] A/P: was benign    For questions or updates, please contact [facility] Please consult [email or web address] for contact  info under     Signed, Alvan Carrier, MD  07/07/2024, 8:05 AM

## 2024-07-08 DIAGNOSIS — I951 Orthostatic hypotension: Secondary | ICD-10-CM | POA: Diagnosis not present

## 2024-07-08 DIAGNOSIS — I200 Unstable angina: Secondary | ICD-10-CM | POA: Diagnosis not present

## 2024-07-08 DIAGNOSIS — E039 Hypothyroidism, unspecified: Secondary | ICD-10-CM | POA: Diagnosis not present

## 2024-07-08 DIAGNOSIS — I480 Paroxysmal atrial fibrillation: Secondary | ICD-10-CM | POA: Diagnosis not present

## 2024-07-08 DIAGNOSIS — E1169 Type 2 diabetes mellitus with other specified complication: Secondary | ICD-10-CM | POA: Diagnosis not present

## 2024-07-08 LAB — CBC
HCT: 37.9 % — ABNORMAL LOW (ref 39.0–52.0)
Hemoglobin: 12.4 g/dL — ABNORMAL LOW (ref 13.0–17.0)
MCH: 29.5 pg (ref 26.0–34.0)
MCHC: 32.7 g/dL (ref 30.0–36.0)
MCV: 90 fL (ref 80.0–100.0)
Platelets: 148 K/uL — ABNORMAL LOW (ref 150–400)
RBC: 4.21 MIL/uL — ABNORMAL LOW (ref 4.22–5.81)
RDW: 14.1 % (ref 11.5–15.5)
WBC: 6.4 K/uL (ref 4.0–10.5)
nRBC: 0 % (ref 0.0–0.2)

## 2024-07-08 LAB — LIPID PANEL
Cholesterol: 150 mg/dL (ref 0–200)
HDL: 49 mg/dL (ref >40)
LDL Cholesterol: 87 mg/dL (ref 0–99)
Total CHOL/HDL Ratio: 3.1 ratio
Triglycerides: 71 mg/dL (ref <150)
VLDL: 14 mg/dL (ref 0–40)

## 2024-07-08 LAB — BASIC METABOLIC PANEL WITH GFR
Anion gap: 10 (ref 5–15)
BUN: 13 mg/dL (ref 8–23)
CO2: 24 mmol/L (ref 22–32)
Calcium: 9.1 mg/dL (ref 8.9–10.3)
Chloride: 109 mmol/L (ref 98–111)
Creatinine, Ser: 0.85 mg/dL (ref 0.61–1.24)
GFR, Estimated: >60 mL/min (ref >60)
Glucose, Bld: 129 mg/dL — ABNORMAL HIGH (ref 70–99)
Potassium: 3.5 mmol/L (ref 3.5–5.1)
Sodium: 143 mmol/L (ref 135–145)

## 2024-07-08 LAB — HEPARIN LEVEL (UNFRACTIONATED)
Heparin Unfractionated: 0.79 [IU]/mL — ABNORMAL HIGH (ref 0.30–0.70)
Heparin Unfractionated: 0.41 [IU]/mL (ref 0.30–0.70)

## 2024-07-08 MED ORDER — EZETIMIBE 10 MG PO TABS
10.0000 mg | ORAL_TABLET | Freq: Every day | ORAL | Status: DC
  Administered 2024-07-08 – 2024-07-10 (×5): 10 mg via ORAL
  Filled 2024-07-08 (×3): qty 1

## 2024-07-08 MED ORDER — ASPIRIN 81 MG PO CHEW
81.0000 mg | CHEWABLE_TABLET | ORAL | Status: AC
  Administered 2024-07-09 (×2): 81 mg via ORAL
  Filled 2024-07-08: qty 1

## 2024-07-08 MED ORDER — FREE WATER
250.0000 mL | Freq: Once | Status: AC
  Administered 2024-07-09 (×2): 250 mL via ORAL

## 2024-07-08 MED ORDER — METHOCARBAMOL 500 MG PO TABS: 500.0000 mg | ORAL_TABLET | Freq: Three times a day (TID) | ORAL | Status: DC | PRN

## 2024-07-08 MED ORDER — POTASSIUM CHLORIDE CRYS ER 20 MEQ PO TBCR
40.0000 meq | EXTENDED_RELEASE_TABLET | Freq: Once | ORAL | Status: AC
  Administered 2024-07-08: 40 meq via ORAL
  Filled 2024-07-08: qty 2

## 2024-07-08 NOTE — Plan of Care (Signed)
  Problem: Health Behavior/Discharge Planning: Goal: Ability to manage health-related needs will improve Outcome: Progressing   Problem: Clinical Measurements: Goal: Ability to maintain clinical measurements within normal limits will improve Outcome: Progressing   Problem: Clinical Measurements: Goal: Will remain free from infection Outcome: Progressing   Problem: Safety: Goal: Ability to remain free from injury will improve Outcome: Progressing   Problem: Clinical Measurements: Goal: Cardiovascular complication will be avoided Outcome: Progressing

## 2024-07-08 NOTE — Progress Notes (Signed)
 PHARMACY - ANTICOAGULATION CONSULT NOTE  Pharmacy Consult for heparin  Indication: chest pain/ACS  Allergies  Allergen Reactions   Codeine Itching   Hydrocodone-Acetaminophen  Hives and Itching   Lipitor [Atorvastatin] Other (See Comments)    Myalgia--all statins per pt   Amoxicillin  Rash   Latex Itching    Patient Measurements: Height: 5' 6 (167.6 cm) Weight: 79.1 kg (174 lb 6.1 oz) IBW/kg (Calculated) : 63.8  Vital Signs: Temp: 98.1 F (36.7 C) (08/10 0429) Temp Source: Oral (08/10 0429) BP: 135/75 (08/10 0429) Pulse Rate: 31 (08/10 0429)  Labs: Recent Labs    07/06/24 1854 07/06/24 2040 07/06/24 2222 07/07/24 0256 07/07/24 2211 07/08/24 0302  HGB  --   --   --  12.1*  --  12.4*  HCT  --   --   --  36.8*  --  37.9*  PLT  --   --   --  149*  --  148*  HEPARINUNFRC  --   --   --  0.18* 0.68 0.79*  CREATININE  --   --   --  0.94  --  0.85  TROPONINIHS 6 6 7   --   --   --     Estimated Creatinine Clearance: 81.1 mL/min (by C-G formula based on SCr of 0.85 mg/dL).   Medical History: Past Medical History:  Diagnosis Date   Abnormal cardiac CT angiography    Abnormal EKG 03/09/2018   Acquired hypothyroidism 03/07/2018   Allergy    ANA positive 10/02/2020   Formatting of this note might be different from the original.  Homogenous. Other tests negartive. Probably false.  See note 10/02/2020   Aortic atherosclerosis (HCC) 07/17/2020   Formatting of this note might be different from the original. Seen on CT [facility] 06/2019   Arthritis    Asthma    CAD (coronary artery disease), native coronary artery    Cataract    Chronic gout without tophus 03/07/2018   Coronary artery calcification seen on CAT scan 07/17/2020   Formatting of this note might be different from the original. 06/2019. [facility]   Coronary artery disease involving native coronary artery of native heart without angina pectoris 07/17/2020   Formatting of this note might be different from the original.  Seen on  [facility] 06/2019     Formatting of this note might be different from the original.  Status post four-vessel bypass     Formatting of this note might be different from the original.  Status post four-vessel bypass 09/2020   Coronary artery disease of native artery of native heart with stable angina pectoris (HCC)    Cough syncope 1999   Dental caries    Depression 03/07/2018   Diplopia 10/11/2018   DISH (diffuse idiopathic skeletal hyperostosis) 07/17/2020   Formatting of this note might be different from the original. Seen on CT [facility] 06/2019   Dizzy 05/09/2018   Gastroesophageal reflux disease without esophagitis 03/07/2018   Formatting of this note might be different from the original. Long term   GERD (gastroesophageal reflux disease) 03/07/2018   Globus sensation 09/22/2022   Gout 03/07/2018   Hay fever    Heel spur, left 02/07/2020   History of asthma 11/06/2020   History of iron deficiency anemia 02/13/2022   History of smokeless tobacco use 07/17/2020   Formatting of this note might be different from the original.  Discussed chantix.  He wants to use. Did well in past. Off label, he understands.     Last  Assessment & Plan:   Formatting of this note might be different from the original.  Patient has been without smokeless tobacco for 1 week now   Hyperlipidemia 03/07/2018   Hypomagnesemia 01/29/2021   Hypothyroidism (acquired) 03/07/2018   Formatting of this note might be different from the original. 1990s.   Mild cognitive impairment 07/01/2022   Mild episode of recurrent major depressive disorder (HCC) 07/17/2020   Mixed hyperlipidemia 03/07/2018   Myalgia due to statin 08/05/2021   Obesity    Obesity (BMI 30-39.9) 12/05/2020   Ocular myasthenia gravis (HCC) 07/17/2020   Formatting of this note might be different from the original. Ocular.  Follows at [facility].  Sees neurology and eye doctor.   Orthostatic hypotension 12/19/2020   PAF (paroxysmal atrial fibrillation) (HCC) 11/12/2021    Palpitation 03/08/2018   Plantar fasciitis 02/07/2020   Pneumonia    Presence of Watchman left atrial appendage closure device 11/12/2021   with 24mm Watchman FLX woth Dr. Cindie   Primary osteoarthritis involving multiple joints 07/17/2020   Recurrent sinusitis    S/P CABG x 4 10/27/2020   Smokeless tobacco use 07/17/2020   Formatting of this note might be different from the original. Discussed chantix.  He wants to use. Did well in past. Off label, he understands.   Subcutaneous nodules 01/27/2021   Thrombus of left atrial appendage    Tightness of heel cord, left 02/07/2020   Type 2 diabetes mellitus (HCC) 03/07/2018   Type 2 diabetes mellitus without complication, without long-term current use of insulin  (HCC) 03/07/2018   Formatting of this note might be different from the original. 2014.   Vitamin B12 deficiency 09/25/2020    Medications:  Medications Prior to Admission  Medication Sig Dispense Refill Last Dose/Taking   acetaminophen  (TYLENOL ) 500 MG tablet Take 500-1,000 mg by mouth every 6 (six) hours as needed (for pain.).   Past Month   albuterol  (PROVENTIL ) (2.5 MG/3ML) 0.083% nebulizer solution Take 2.5 mg by nebulization every 6 (six) hours as needed for wheezing or shortness of breath.   Taking As Needed   albuterol  (VENTOLIN  HFA) 108 (90 Base) MCG/ACT inhaler Inhale into the lungs every 6 (six) hours as needed for wheezing or shortness of breath.   Taking As Needed   aspirin  EC 81 MG tablet Take 81 mg by mouth at bedtime.   07/05/2024   Dextran 70-Hypromellose, PF, (ARTIFICIAL TEARS PF) 0.1-0.3 % SOLN Place 1 drop into both eyes 3 (three) times daily as needed (dry/irritated eyes.).   Past Week   fludrocortisone  (FLORINEF ) 0.1 MG tablet TAKE 1 TABLET BY MOUTH DAILY. 90 tablet 0 07/06/2024   levothyroxine  (SYNTHROID ) 137 MCG tablet Take 137 mcg by mouth daily.   07/06/2024   Magnesium  Oxide 250 MG TABS Take 250 mg by mouth daily.   07/06/2024   metFORMIN (GLUCOPHAGE) 1000 MG  tablet Take 1,000 mg by mouth 2 (two) times daily with a meal.   07/06/2024   midodrine  (PROAMATINE ) 5 MG tablet Take 1 tablet (5 mg total) by mouth 3 (three) times daily with meals. 270 tablet 3 07/06/2024   mycophenolate  (CELLCEPT ) 500 MG tablet Take 1,500 mg by mouth 2 (two) times daily.   07/06/2024   Omega-3 Fatty Acids (FISH OIL ULTRA) 1400 MG CAPS Take 1,400 mg by mouth in the morning and at bedtime. (Patient taking differently: Take 1,400 mg by mouth daily.)   07/06/2024   pantoprazole  (PROTONIX ) 40 MG tablet Take 40 mg by mouth at bedtime.   07/05/2024  PARoxetine  (PAXIL ) 40 MG tablet Take 40 mg by mouth daily.   07/06/2024   pyridostigmine  (MESTINON ) 60 MG tablet Take 90 mg by mouth 4 (four) times daily. Take 1 1/2 tablet (90 mg) 4 times daily (Patient taking differently: Take 120 mg by mouth 4 (four) times daily. Take 2 tablets (120 mg) 4 times daily)   07/06/2024   vitamin B-12 (CYANOCOBALAMIN) 1000 MCG tablet Take 1,000 mcg by mouth daily.   07/06/2024   OZEMPIC, 1 MG/DOSE, 4 MG/3ML SOPN Inject 1 mg into the skin once a week. (Patient not taking: Reported on 07/06/2024)   Not Taking   Scheduled:   aspirin  EC  81 mg Oral Daily   fludrocortisone   100 mcg Oral Daily   levothyroxine   125 mcg Oral Daily   midodrine   5 mg Oral TID WC   mycophenolate   1,500 mg Oral BID   pantoprazole   40 mg Oral Daily   PARoxetine   40 mg Oral Daily   pyridostigmine   120 mg Oral QID    Assessment: Pt with a hx of PAF but not on AC. Presented to [facility] for CP. He got 1 dose of lovenox  80mg  x1 around noon on 08/08. Plan to transition to IV heparin  here.   8/10 AM update:  Heparin  level supra-therapeutic   Goal of Therapy:  Heparin  level 0.3-0.7 units/ml Monitor platelets by anticoagulation protocol: Yes   Plan:  Decrease heparin  drip to 1150 units/hr Heparin  level in 8 hours Daily CBC and heparin  level Continue to monitor for s/sx of bleeding Plan for cath on Monday  Lynwood Mckusick, PharmD,  BCPS Clinical Pharmacist Phone: [phone]

## 2024-07-08 NOTE — Progress Notes (Signed)
 PHARMACY - ANTICOAGULATION CONSULT NOTE  Pharmacy Consult for heparin  Indication: chest pain/ACS  Allergies  Allergen Reactions   Codeine Itching   Hydrocodone-Acetaminophen  Hives and Itching   Lipitor [Atorvastatin] Other (See Comments)    Myalgia--all statins per pt   Amoxicillin  Rash   Latex Itching    Patient Measurements: Height: 5' 6 (167.6 cm) Weight: 79.1 kg (174 lb 6.1 oz) IBW/kg (Calculated) : 63.8  Vital Signs: Temp: 97 F (36.1 C) (08/10 1127) Temp Source: Oral (08/10 1127) BP: 118/66 (08/10 1127) Pulse Rate: 49 (08/10 1127)  Labs: Recent Labs    07/06/24 1854 07/06/24 2040 07/06/24 2222 07/07/24 0256 07/07/24 0256 07/07/24 2211 07/08/24 0302 07/08/24 1301  HGB  --   --   --  12.1*  --   --  12.4*  --   HCT  --   --   --  36.8*  --   --  37.9*  --   PLT  --   --   --  149*  --   --  148*  --   HEPARINUNFRC  --   --   --  0.18*   < > 0.68 0.79* 0.41  CREATININE  --   --   --  0.94  --   --  0.85  --   TROPONINIHS 6 6 7   --   --   --   --   --    < > = values in this interval not displayed.    Estimated Creatinine Clearance: 81.1 mL/min (by C-G formula based on SCr of 0.85 mg/dL).   Medical History: Past Medical History:  Diagnosis Date   Abnormal cardiac CT angiography    Abnormal EKG 03/09/2018   Acquired hypothyroidism 03/07/2018   Allergy    ANA positive 10/02/2020   Formatting of this note might be different from the original.  Homogenous. Other tests negartive. Probably false.  See note 10/02/2020   Aortic atherosclerosis (HCC) 07/17/2020   Formatting of this note might be different from the original. Seen on CT [facility] 06/2019   Arthritis    Asthma    CAD (coronary artery disease), native coronary artery    Cataract    Chronic gout without tophus 03/07/2018   Coronary artery calcification seen on CAT scan 07/17/2020   Formatting of this note might be different from the original. 06/2019. [facility]   Coronary artery disease involving native  coronary artery of native heart without angina pectoris 07/17/2020   Formatting of this note might be different from the original.  Seen on CT [facility] 06/2019     Formatting of this note might be different from the original.  Status post four-vessel bypass     Formatting of this note might be different from the original.  Status post four-vessel bypass 09/2020   Coronary artery disease of native artery of native heart with stable angina pectoris (HCC)    Cough syncope 1999   Dental caries    Depression 03/07/2018   Diplopia 10/11/2018   DISH (diffuse idiopathic skeletal hyperostosis) 07/17/2020   Formatting of this note might be different from the original. Seen on CT [facility] 06/2019   Dizzy 05/09/2018   Gastroesophageal reflux disease without esophagitis 03/07/2018   Formatting of this note might be different from the original. Long term   GERD (gastroesophageal reflux disease) 03/07/2018   Globus sensation 09/22/2022   Gout 03/07/2018   Hay fever    Heel spur, left 02/07/2020   History of asthma  11/06/2020   History of iron deficiency anemia 02/13/2022   History of smokeless tobacco use 07/17/2020   Formatting of this note might be different from the original.  Discussed chantix.  He wants to use. Did well in past. Off label, he understands.     Last Assessment & Plan:   Formatting of this note might be different from the original.  Patient has been without smokeless tobacco for 1 week now   Hyperlipidemia 03/07/2018   Hypomagnesemia 01/29/2021   Hypothyroidism (acquired) 03/07/2018   Formatting of this note might be different from the original. 1990s.   Mild cognitive impairment 07/01/2022   Mild episode of recurrent major depressive disorder (HCC) 07/17/2020   Mixed hyperlipidemia 03/07/2018   Myalgia due to statin 08/05/2021   Obesity    Obesity (BMI 30-39.9) 12/05/2020   Ocular myasthenia gravis (HCC) 07/17/2020   Formatting of this note might be different from the original. Ocular.   Follows at [facility].  Sees neurology and eye doctor.   Orthostatic hypotension 12/19/2020   PAF (paroxysmal atrial fibrillation) (HCC) 11/12/2021   Palpitation 03/08/2018   Plantar fasciitis 02/07/2020   Pneumonia    Presence of Watchman left atrial appendage closure device 11/12/2021   with 24mm Watchman FLX woth Dr. Cindie   Primary osteoarthritis involving multiple joints 07/17/2020   Recurrent sinusitis    S/P CABG x 4 10/27/2020   Smokeless tobacco use 07/17/2020   Formatting of this note might be different from the original. Discussed chantix.  He wants to use. Did well in past. Off label, he understands.   Subcutaneous nodules 01/27/2021   Thrombus of left atrial appendage    Tightness of heel cord, left 02/07/2020   Type 2 diabetes mellitus (HCC) 03/07/2018   Type 2 diabetes mellitus without complication, without long-term current use of insulin  (HCC) 03/07/2018   Formatting of this note might be different from the original. 2014.   Vitamin B12 deficiency 09/25/2020    Medications:  Medications Prior to Admission  Medication Sig Dispense Refill Last Dose/Taking   acetaminophen  (TYLENOL ) 500 MG tablet Take 500-1,000 mg by mouth every 6 (six) hours as needed (for pain.).   Past Month   albuterol  (PROVENTIL ) (2.5 MG/3ML) 0.083% nebulizer solution Take 2.5 mg by nebulization every 6 (six) hours as needed for wheezing or shortness of breath.   Taking As Needed   albuterol  (VENTOLIN  HFA) 108 (90 Base) MCG/ACT inhaler Inhale into the lungs every 6 (six) hours as needed for wheezing or shortness of breath.   Taking As Needed   aspirin  EC 81 MG tablet Take 81 mg by mouth at bedtime.   07/05/2024   Dextran 70-Hypromellose, PF, (ARTIFICIAL TEARS PF) 0.1-0.3 % SOLN Place 1 drop into both eyes 3 (three) times daily as needed (dry/irritated eyes.).   Past Week   fludrocortisone  (FLORINEF ) 0.1 MG tablet TAKE 1 TABLET BY MOUTH DAILY. 90 tablet 0 07/06/2024   levothyroxine  (SYNTHROID ) 137 MCG tablet  Take 137 mcg by mouth daily.   07/06/2024   Magnesium  Oxide 250 MG TABS Take 250 mg by mouth daily.   07/06/2024   metFORMIN (GLUCOPHAGE) 1000 MG tablet Take 1,000 mg by mouth 2 (two) times daily with a meal.   07/06/2024   midodrine  (PROAMATINE ) 5 MG tablet Take 1 tablet (5 mg total) by mouth 3 (three) times daily with meals. 270 tablet 3 07/06/2024   mycophenolate  (CELLCEPT ) 500 MG tablet Take 1,500 mg by mouth 2 (two) times daily.   07/06/2024  Omega-3 Fatty Acids (FISH OIL ULTRA) 1400 MG CAPS Take 1,400 mg by mouth in the morning and at bedtime. (Patient taking differently: Take 1,400 mg by mouth daily.)   07/06/2024   pantoprazole  (PROTONIX ) 40 MG tablet Take 40 mg by mouth at bedtime.   07/05/2024   PARoxetine  (PAXIL ) 40 MG tablet Take 40 mg by mouth daily.   07/06/2024   pyridostigmine  (MESTINON ) 60 MG tablet Take 90 mg by mouth 4 (four) times daily. Take 1 1/2 tablet (90 mg) 4 times daily (Patient taking differently: Take 120 mg by mouth 4 (four) times daily. Take 2 tablets (120 mg) 4 times daily)   07/06/2024   vitamin B-12 (CYANOCOBALAMIN) 1000 MCG tablet Take 1,000 mcg by mouth daily.   07/06/2024   OZEMPIC, 1 MG/DOSE, 4 MG/3ML SOPN Inject 1 mg into the skin once a week. (Patient not taking: Reported on 07/06/2024)   Not Taking   Scheduled:   aspirin  EC  81 mg Oral Daily   ezetimibe   10 mg Oral Daily   fludrocortisone   100 mcg Oral Daily   levothyroxine   125 mcg Oral Daily   midodrine   5 mg Oral TID WC   mycophenolate   1,500 mg Oral BID   pantoprazole   40 mg Oral Daily   PARoxetine   40 mg Oral Daily   pyridostigmine   120 mg Oral QID    Assessment: Pt with a hx of PAF but not on AC. Presented to [facility] for CP. He got 1 dose of lovenox  80mg  x1 around noon on 08/08. Plan to transition to IV heparin  here.   8/10 PM update:  Heparin  level therapeutic  Goal of Therapy:  Heparin  level 0.3-0.7 units/ml Monitor platelets by anticoagulation protocol: Yes   Plan:  Continue heparin  drip at  1150 units/hr Daily CBC and heparin  level Continue to monitor for s/sx of bleeding Plan for cath on Monday  R. Samual Satterfield, PharmD PGY-1 Acute Care Pharmacy Resident [facility] 07/08/2024 2:03 PM

## 2024-07-08 NOTE — Progress Notes (Signed)
 Progress Note   Patient: Derek Moore FMW:[id number] DOB: 1955/11/30 DOA: 07/06/2024     2 DOS: the patient was seen and examined on 07/08/2024   Brief hospital course: Derek Moore was admitted to the hospital with the working diagnosis of chest pain, acute coronary syndrome.    68 y.o. male with medical history significant of myasthenia gravis, T2DM, coronary artery disease, CABG 2021, atrial fibrillation, who presented with chest pain.  Reported acute chest pain, severe in intensity, pressure like, substernal to epigastric/ right upper quadrant in location, associated with dyspnea, nausea and diaphoresis. Radiated to the left arm.  Symptoms resolved spontaneously after 30 to 45 minutes.  Apparently he had intermittent left arm pain for one week prior, that occurred intermittently at rest and on exertion.  He was transferred from [facility] ED to [facility] for cardiac evaluation.  His vital signs at the time of transfer had a blood pressure of 120/69, HR 46, RR 18 and 02 saturation 100%  Cardiovascular with S1 and S2 present and regular with no gallops, rubs or murmurs Respiratory with no rales or rhonchi, no wheezing Abdomen with no distention  No lower extremity edema.  Na 140, K 4.0 Cl 107 bicarbonate 27 glucose 181 bun 16 cr 0,60  Wbc 5.0 hgb 13,6 plt 158  D dimer 0,29  Troponin I < 0.01, 0.02   BNP 168  AST 73 ALT 29  total bilirubin 0,9 ALK P 85  Lipase 8,872    Chest radiograph result no acute changes CT abdomen and pelvis negative for acute intra abdominal or pelvic pathology. Pancrease negative    10:08 EKG 56 bpm, normal axis, qtc 508, sinus rhythm with PAC, poor RR wave progression, ST depression II, III, aVF, negative T wave lead II, III, avF  12:08 EKG 51 bpm, normal axis, qtc 504, sinus rhythm, poor RR wave progression, ST depression in lead II, III, avF, negative T wave lead I, aVL, V4 to V6.  Patient was place on heparin  drip for anticoagulation and plan for cardiac  catheterization.   Assessment and Plan: * CAD (coronary artery disease), native coronary artery Chest pain, not exertion related, with inferolateral ischemic changes on EKG. Prior history of CABG (reported not having angina when diagnosed with coronary artery disease)  High sensitive troponin negative  Currently chest pain free.  08/10 EKG 49 bpm, left axis deviation, interventricular conduction delay, qtc 431, sinus rhythm with no significant ST segment changes, negative T wave lead I aVL, V5 and V6.   Antiplatelet therapy with aspirin  No statin due to diagnosis of myasthenia gravis.   Anticoagulation with heparin  drip  Hold on B blocker due to risk of bradycardia As needed nitroglycerin  and hydromorphone  in case of chest pain.   Follow up echocardiogram with preserved LV systolic function EF 60 to 65%, no regional wall motion abnormalities, RV systolic function preserved, LA with mild dilatation, mild MR.   PAF (paroxysmal atrial fibrillation) (HCC) Patient currently sinus rhythm, not on AV blockade.  Sp left atrial appendage closure device, not on anticoagulation.  Continue telemetry monitoring.   Orthostatic hypotension Continue with midodrine  and fludrocortisone .   Type 2 diabetes mellitus with hyperlipidemia (HCC) Hold on metformin and GLP 1 agonist.  Fasting glucose is 129 mg/dl  Will hold on insulin  therapy for now.   Acquired hypothyroidism Continue levothyroxine    GERD (gastroesophageal reflux disease) Continue pantoprazole    Myasthenia gravis (HCC) Continue with pyridostigmine , no signs of acute decompensation.  Continue with mycophenolate .   Depression  Continue with paroxetine        Subjective: Patient with no significant chest pain, no dyspnea, no peripheral edema, PND or orthopnea   Physical Exam: Vitals:   07/07/24 1940 07/07/24 2325 07/08/24 0429 07/08/24 0719  BP: 123/61 (!) 130/54 135/75 131/67  Pulse: (!) 51 (!) 43 (!) 31 (!) 48  Resp: 20 14  18 16   Temp: 98.3 F (36.8 C) 97.7 F (36.5 C) 98.1 F (36.7 C) 97.8 F (36.6 C)  TempSrc: Oral Oral Oral Oral  SpO2: 99% 96% 98% 98%  Weight:   79.1 kg   Height:       Neurology awake and alert ENT with no pallor Cardiovascular with S1 and S2 present and regular  Respiratory with no wheezing or rhonchi  Abdomen with no distention  No lower extremity edema   Data Reviewed:    Family Communication: no family at the bedside   Disposition: Status is: Inpatient Remains inpatient appropriate because: cardiac catheterization tomorrow   Planned Discharge Destination: Home    Author: Elidia Toribio Furnace, MD 07/08/2024 10:04 AM  For on call review [email or web address].

## 2024-07-08 NOTE — Progress Notes (Signed)
 Rounding Note   Patient Name: Derek Moore Date of Encounter: 07/08/2024  [facility] Cardiologist: Kardie Tobb, DO   Subjective Mild transient chest pain last night  Scheduled Meds:  aspirin  EC  81 mg Oral Daily   fludrocortisone   100 mcg Oral Daily   levothyroxine   125 mcg Oral Daily   midodrine   5 mg Oral TID WC   mycophenolate   1,500 mg Oral BID   pantoprazole   40 mg Oral Daily   PARoxetine   40 mg Oral Daily   pyridostigmine   120 mg Oral QID   Continuous Infusions:  heparin  1,150 Units/hr (07/08/24 0446)   PRN Meds: albuterol , HYDROmorphone  (DILAUDID ) injection, nitroGLYCERIN , ondansetron  **OR** ondansetron  (ZOFRAN ) IV   Vital Signs  Vitals:   07/07/24 1640 07/07/24 1940 07/07/24 2325 07/08/24 0429  BP: 122/70 123/61 (!) 130/54 135/75  Pulse: (!) 49 (!) 51 (!) 43 (!) 31  Resp: 16 20 14 18   Temp: 98.5 F (36.9 C) 98.3 F (36.8 C) 97.7 F (36.5 C) 98.1 F (36.7 C)  TempSrc: Oral Oral Oral Oral  SpO2: 99% 99% 96% 98%  Weight:    79.1 kg  Height:        Intake/Output Summary (Last 24 hours) at 07/08/2024 0816 Last data filed at 07/08/2024 0552 Gross per 24 hour  Intake 530.19 ml  Output --  Net 530.19 ml      07/08/2024    4:29 AM 07/07/2024    5:35 AM 07/06/2024    6:00 PM  Last 3 Weights  Weight (lbs) 174 lb 6.1 oz 175 lb 4.3 oz 171 lb 1.2 oz  Weight (kg) 79.1 kg 79.5 kg 77.6 kg      Telemetry Sinus rhythm and sinus bradycardia - Personally Reviewed  ECG  N/a - Personally Reviewed  Physical Exam  GEN: No acute distress.   Neck: No JVD Cardiac: RRR, no murmurs, rubs, or gallops.  Respiratory: Clear to auscultation bilaterally. GI: Soft, nontender, non-distended  MS: No edema; No deformity. Neuro:  Nonfocal  Psych: Normal affect   Labs High Sensitivity Troponin:   Recent Labs  Lab 07/06/24 1854 07/06/24 2040 07/06/24 2222  TROPONINIHS 6 6 7      Chemistry Recent Labs  Lab 07/07/24 0256 07/08/24 0302  NA 140 143  K  3.8 3.5  CL 109 109  CO2 24 24  GLUCOSE 121* 129*  BUN 14 13  CREATININE 0.94 0.85  CALCIUM  8.9 9.1  PROT 5.6*  --   ALBUMIN  3.3*  --   AST 103*  --   ALT 87*  --   ALKPHOS 86  --   BILITOT 0.4  --   GFRNONAA >60 >60  ANIONGAP 7 10    Lipids  Recent Labs  Lab 07/08/24 0302  CHOL 150  TRIG 71  HDL 49  LDLCALC 87  CHOLHDL 3.1    Hematology Recent Labs  Lab 07/07/24 0256 07/08/24 0302  WBC 6.2 6.4  RBC 4.08* 4.21*  HGB 12.1* 12.4*  HCT 36.8* 37.9*  MCV 90.2 90.0  MCH 29.7 29.5  MCHC 32.9 32.7  RDW 14.0 14.1  PLT 149* 148*   Thyroid  No results for input(s): TSH, FREET4 in the last 168 hours.  BNPNo results for input(s): BNP, PROBNP in the last 168 hours.  DDimer No results for input(s): DDIMER in the last 168 hours.   Radiology  ECHOCARDIOGRAM COMPLETE Result Date: 07/07/2024    ECHOCARDIOGRAM REPORT   Patient Name:   Derek Moore Date  of Exam: 07/07/2024 Medical Rec #:  [id number]         Height:       66.0 in Accession #:    [id number]        Weight:       175.3 lb Date of Birth:  02/18/1956        BSA:          1.891 m Patient Age:    55 years          BP:           118/68 mmHg Patient Gender: M                 HR:           50 bpm. Exam Location:  Inpatient Procedure: 2D Echo (Both Spectral and Color Flow Doppler were utilized during            procedure). Indications:    chest pain  History:        Patient has prior history of Echocardiogram examinations, most                 recent 12/29/2021. Prior CABG; Risk Factors:Diabetes,                 Dyslipidemia and Former Smoker.  Sonographer:    Tinnie Barefoot RDCS Referring Phys: [id number] MAURICIO DANIEL ARRIEN IMPRESSIONS  1. Left ventricular ejection fraction, by estimation, is 60 to 65%. The left ventricle has normal function. The left ventricle has no regional wall motion abnormalities. Left ventricular diastolic parameters were normal.  2. Right ventricular systolic function is normal. The right  ventricular size is normal. Tricuspid regurgitation signal is inadequate for assessing PA pressure.  3. Left atrial size was mildly dilated.  4. The mitral valve is abnormal. Mild mitral valve regurgitation. No evidence of mitral stenosis.  5. The aortic valve is tricuspid. Aortic valve regurgitation is not visualized. No aortic stenosis is present.  6. The inferior vena cava is normal in size with greater than 50% respiratory variability, suggesting right atrial pressure of 3 mmHg. FINDINGS  Left Ventricle: Left ventricular ejection fraction, by estimation, is 60 to 65%. The left ventricle has normal function. The left ventricle has no regional wall motion abnormalities. The left ventricular internal cavity size was normal in size. There is  no left ventricular hypertrophy. Left ventricular diastolic parameters were normal. Right Ventricle: The right ventricular size is normal. Right vetricular wall thickness was not well visualized. Right ventricular systolic function is normal. Tricuspid regurgitation signal is inadequate for assessing PA pressure. Left Atrium: Left atrial size was mildly dilated. Right Atrium: Right atrial size was normal in size. Pericardium: There is no evidence of pericardial effusion. Mitral Valve: The mitral valve is abnormal. Mild mitral valve regurgitation. No evidence of mitral valve stenosis. Tricuspid Valve: The tricuspid valve is normal in structure. Tricuspid valve regurgitation is trivial. No evidence of tricuspid stenosis. Aortic Valve: The aortic valve is tricuspid. Aortic valve regurgitation is not visualized. No aortic stenosis is present. Aortic valve mean gradient measures 5.5 mmHg. Aortic valve peak gradient measures 12.1 mmHg. Aortic valve area, by VTI measures 1.81  cm. Pulmonic Valve: The pulmonic valve was not well visualized. Pulmonic valve regurgitation is not visualized. No evidence of pulmonic stenosis. Aorta: The aortic root is normal in size and structure. Venous:  The inferior vena cava is normal in size with greater than 50% respiratory variability, suggesting right atrial pressure of 3 mmHg.  IAS/Shunts: No atrial level shunt detected by color flow Doppler.  LEFT VENTRICLE PLAX 2D LVIDd:         5.70 cm LVIDs:         3.80 cm LV PW:         1.00 cm LV IVS:        1.00 cm LVOT diam:     2.10 cm LV SV:         77 LV SV Index:   41 LVOT Area:     3.46 cm  RIGHT VENTRICLE             IVC RV Basal diam:  2.90 cm     IVC diam: 2.20 cm RV S prime:     11.60 cm/s TAPSE (M-mode): 1.8 cm LEFT ATRIUM             Index        RIGHT ATRIUM           Index LA diam:        4.40 cm 2.33 cm/m   RA Area:     16.10 cm LA Vol (A2C):   73.8 ml 39.03 ml/m  RA Volume:   43.80 ml  23.17 ml/m LA Vol (A4C):   70.2 ml 37.13 ml/m LA Biplane Vol: 75.5 ml 39.93 ml/m  AORTIC VALVE AV Area (Vmax):    1.85 cm AV Area (Vmean):   1.98 cm AV Area (VTI):     1.81 cm AV Vmax:           173.74 cm/s AV Vmean:          106.604 cm/s AV VTI:            0.428 m AV Peak Grad:      12.1 mmHg AV Mean Grad:      5.5 mmHg LVOT Vmax:         92.70 cm/s LVOT Vmean:        60.900 cm/s LVOT VTI:          0.223 m LVOT/AV VTI ratio: 0.52  AORTA Ao Root diam: 3.10 cm Ao Asc diam:  3.10 cm MITRAL VALVE MV Area (PHT): 2.87 cm     SHUNTS MV Decel Time: 264 msec     Systemic VTI:  0.22 m MR Peak grad: 93.3 mmHg     Systemic Diam: 2.10 cm MR Mean grad: 66.0 mmHg MR Vmax:      483.00 cm/s MR Vmean:     391.0 cm/s MV E velocity: 115.00 cm/s MV A velocity: 78.50 cm/s MV E/A ratio:  1.46 Dorn Ross MD Electronically signed by Dorn Ross MD Signature Date/Time: 07/07/2024/6:08:51 PM    Final      Patient Profile   Derek Moore is a 68 y.o. male with a hx of CAD with prior CABG, PAF with watchman device, who is being seen 07/06/2024 for the evaluation of chest pain at the request of Dr Noralee.   Assessment & Plan   1.CAD - history of CABG in 2021 LIMA-D2 LAD too small for grafting), SVG-D1, SVG-OM1,  SVG-PDA - presented with initially intermittent left arm pain over the last week, which progressed to 8/10 mid to right chest pressing like feeling on day of admission lasting about 45 minutes - initially presented to [facility], seen by cardiology at [facility], on their evaluation concern given typical symptoms and new inferior and lateral precordial TWIs/ST depressions for unstable angina. He was transferred to [facility] with  plans for cath Monday - EKG compared to 2023, has some chronic inferior and lateral precordial changes more prominent on admission.  - continue medical therapy, plan for cath Monday  - intolerant to statins. Other medical therapy limited by chronic hypotension on midodrine , some low HRs at times as additional reason to avoid beta blockers. LDL 87, will add zetia  10mg  daily -  echo LVEF 60-65%, no WMAs -medical therapy with ASA 81, hep gtt, statin intolerant adding zetia  10mg  daily for LDL of 87.   Informed Consent   Shared Decision Making/Informed Consent The risks [stroke (1 in 1000), death (1 in 1000), kidney failure [usually temporary] (1 in 500), bleeding (1 in 200), allergic reaction [possibly serious] (1 in 200)], benefits (diagnostic support and management of coronary artery disease) and alternatives of a cardiac catheterization were discussed in detail with Mr. Jeffries and he is willing to proceed.      2. PAF - has watchman device, not on anticoag   3. Elevated lipase - [phone] at [facility], repeat here mildly elevated at [address] A/P: was benign For questions or updates, please contact [facility] Please consult [email or web address] for contact info under     Signed, Alvan Carrier, MD  07/08/2024, 8:16 AM

## 2024-07-08 NOTE — Plan of Care (Signed)
  Problem: Education: Goal: Knowledge of General Education information will improve Description: Including pain rating scale, medication(s)/side effects and non-pharmacologic comfort measures Outcome: Progressing   Problem: Clinical Measurements: Goal: Ability to maintain clinical measurements within normal limits will improve Outcome: Progressing   Problem: Clinical Measurements: Goal: Will remain free from infection Outcome: Progressing   Problem: Activity: Goal: Risk for activity intolerance will decrease Outcome: Progressing   Problem: Nutrition: Goal: Adequate nutrition will be maintained Outcome: Progressing   Problem: Clinical Measurements: Goal: Cardiovascular complication will be avoided Outcome: Progressing

## 2024-07-09 ENCOUNTER — Encounter (HOSPITAL_COMMUNITY)
Admission: EM | Disposition: A | Discharge status: Home / Self Care | Payer: Self-pay | Source: Other Acute Inpatient Hospital | Attending: Internal Medicine

## 2024-07-09 ENCOUNTER — Telehealth (HOSPITAL_COMMUNITY): Payer: Self-pay | Admitting: Pharmacy Technician

## 2024-07-09 ENCOUNTER — Other Ambulatory Visit (HOSPITAL_COMMUNITY): Payer: Self-pay

## 2024-07-09 DIAGNOSIS — I25110 Atherosclerotic heart disease of native coronary artery with unstable angina pectoris: Principal | ICD-10-CM

## 2024-07-09 DIAGNOSIS — E039 Hypothyroidism, unspecified: Secondary | ICD-10-CM | POA: Diagnosis not present

## 2024-07-09 DIAGNOSIS — I951 Orthostatic hypotension: Secondary | ICD-10-CM | POA: Diagnosis not present

## 2024-07-09 DIAGNOSIS — I480 Paroxysmal atrial fibrillation: Secondary | ICD-10-CM | POA: Diagnosis not present

## 2024-07-09 DIAGNOSIS — G7000 Myasthenia gravis without (acute) exacerbation: Secondary | ICD-10-CM | POA: Diagnosis not present

## 2024-07-09 HISTORY — PX: CORONARY STENT INTERVENTION: CATH118234

## 2024-07-09 HISTORY — PX: CORONARY PRESSURE/FFR STUDY: CATH118243

## 2024-07-09 HISTORY — PX: LEFT HEART CATH AND CORS/GRAFTS ANGIOGRAPHY: CATH118250

## 2024-07-09 LAB — GLUCOSE, CAPILLARY: Glucose-Capillary: 98 mg/dL (ref 70–99)

## 2024-07-09 LAB — BASIC METABOLIC PANEL WITH GFR
Anion gap: 9 (ref 5–15)
BUN: 12 mg/dL (ref 8–23)
CO2: 24 mmol/L (ref 22–32)
Calcium: 9.3 mg/dL (ref 8.9–10.3)
Chloride: 107 mmol/L (ref 98–111)
Creatinine, Ser: 0.9 mg/dL (ref 0.61–1.24)
GFR, Estimated: >60 mL/min (ref >60)
Glucose, Bld: 132 mg/dL — ABNORMAL HIGH (ref 70–99)
Potassium: 3.5 mmol/L (ref 3.5–5.1)
Sodium: 140 mmol/L (ref 135–145)

## 2024-07-09 LAB — CBC
HCT: 38.4 % — ABNORMAL LOW (ref 39.0–52.0)
Hemoglobin: 12.8 g/dL — ABNORMAL LOW (ref 13.0–17.0)
MCH: 30 pg (ref 26.0–34.0)
MCHC: 33.3 g/dL (ref 30.0–36.0)
MCV: 90.1 fL (ref 80.0–100.0)
Platelets: 172 K/uL (ref 150–400)
RBC: 4.26 MIL/uL (ref 4.22–5.81)
RDW: 14.1 % (ref 11.5–15.5)
WBC: 7.4 K/uL (ref 4.0–10.5)
nRBC: 0 % (ref 0.0–0.2)

## 2024-07-09 LAB — HEPARIN LEVEL (UNFRACTIONATED): Heparin Unfractionated: 0.33 [IU]/mL (ref 0.30–0.70)

## 2024-07-09 LAB — POCT ACTIVATED CLOTTING TIME
Activated Clotting Time: 251 s
Activated Clotting Time: 273 s
Activated Clotting Time: 291 s

## 2024-07-09 LAB — MRSA NEXT GEN BY PCR, NASAL: MRSA by PCR Next Gen: NOT DETECTED

## 2024-07-09 MED ORDER — IOHEXOL 350 MG/ML SOLN
INTRAVENOUS | Status: DC | PRN
  Administered 2024-07-09 (×2): 130 mL

## 2024-07-09 MED ORDER — ENOXAPARIN SODIUM 40 MG/0.4ML IJ SOSY
40.0000 mg | PREFILLED_SYRINGE | INTRAMUSCULAR | Status: DC
  Administered 2024-07-10 (×2): 40 mg via SUBCUTANEOUS
  Filled 2024-07-09: qty 0.4

## 2024-07-09 MED ORDER — HEPARIN SODIUM (PORCINE) 1000 UNIT/ML IJ SOLN
INTRAMUSCULAR | Status: AC
  Filled 2024-07-09: qty 10

## 2024-07-09 MED ORDER — PRASUGREL HCL 10 MG PO TABS
ORAL_TABLET | ORAL | Status: AC
  Filled 2024-07-09: qty 6

## 2024-07-09 MED ORDER — SODIUM CHLORIDE 0.9% FLUSH: 3.0000 mL | INTRAVENOUS | Status: DC | PRN

## 2024-07-09 MED ORDER — VERAPAMIL HCL 2.5 MG/ML IV SOLN
INTRAVENOUS | Status: AC
  Filled 2024-07-09: qty 2

## 2024-07-09 MED ORDER — MIDAZOLAM HCL 2 MG/2ML IJ SOLN
INTRAMUSCULAR | Status: DC | PRN
  Administered 2024-07-09 (×4): .5 mg via INTRAVENOUS

## 2024-07-09 MED ORDER — FREE WATER
500.0000 mL | Freq: Once | Status: AC
  Administered 2024-07-09 (×2): 500 mL via ORAL

## 2024-07-09 MED ORDER — NITROGLYCERIN 1 MG/10 ML FOR IR/CATH LAB
INTRA_ARTERIAL | Status: DC | PRN
  Administered 2024-07-09 (×4): 200 ug via INTRACORONARY

## 2024-07-09 MED ORDER — LIDOCAINE HCL (PF) 1 % IJ SOLN
INTRAMUSCULAR | Status: AC
  Filled 2024-07-09: qty 30

## 2024-07-09 MED ORDER — HEPARIN SODIUM (PORCINE) 1000 UNIT/ML IJ SOLN
INTRAMUSCULAR | Status: DC | PRN
  Administered 2024-07-09 (×3): 4000 [IU] via INTRAVENOUS
  Administered 2024-07-09: 2000 [IU] via INTRAVENOUS
  Administered 2024-07-09: 4000 [IU] via INTRAVENOUS
  Administered 2024-07-09 (×2): 3000 [IU] via INTRAVENOUS
  Administered 2024-07-09: 2000 [IU] via INTRAVENOUS

## 2024-07-09 MED ORDER — VERAPAMIL HCL 2.5 MG/ML IV SOLN
INTRAVENOUS | Status: DC | PRN
  Administered 2024-07-09 (×2): 10 mL via INTRA_ARTERIAL

## 2024-07-09 MED ORDER — HYDRALAZINE HCL 20 MG/ML IJ SOLN: 10.0000 mg | INTRAMUSCULAR | Status: AC | PRN

## 2024-07-09 MED ORDER — FENTANYL CITRATE (PF) 100 MCG/2ML IJ SOLN
INTRAMUSCULAR | Status: AC
  Filled 2024-07-09: qty 2

## 2024-07-09 MED ORDER — PRASUGREL HCL 10 MG PO TABS
ORAL_TABLET | ORAL | Status: DC | PRN
  Administered 2024-07-09 (×2): 60 mg via ORAL

## 2024-07-09 MED ORDER — MIDAZOLAM HCL 2 MG/2ML IJ SOLN
INTRAMUSCULAR | Status: AC
  Filled 2024-07-09: qty 2

## 2024-07-09 MED ORDER — SODIUM CHLORIDE 0.9% FLUSH
3.0000 mL | Freq: Two times a day (BID) | INTRAVENOUS | Status: DC
  Administered 2024-07-09 – 2024-07-10 (×4): 3 mL via INTRAVENOUS

## 2024-07-09 MED ORDER — PRASUGREL HCL 10 MG PO TABS
10.0000 mg | ORAL_TABLET | Freq: Every day | ORAL | Status: DC
  Administered 2024-07-10 (×2): 10 mg via ORAL
  Filled 2024-07-09: qty 1

## 2024-07-09 MED ORDER — SODIUM CHLORIDE 0.9 % IV SOLN
250.0000 mL | INTRAVENOUS | Status: DC | PRN
Start: 2024-07-09 — End: 2024-07-10

## 2024-07-09 MED ORDER — HEPARIN (PORCINE) IN NACL 1000-0.9 UT/500ML-% IV SOLN
INTRAVENOUS | Status: DC | PRN
  Administered 2024-07-09 (×4): 500 mL

## 2024-07-09 MED ORDER — LIDOCAINE HCL (PF) 1 % IJ SOLN
INTRAMUSCULAR | Status: DC | PRN
  Administered 2024-07-09 (×2): 5 mL via INTRADERMAL

## 2024-07-09 MED ORDER — FENTANYL CITRATE (PF) 100 MCG/2ML IJ SOLN
INTRAMUSCULAR | Status: DC | PRN
  Administered 2024-07-09 (×4): 12.5 ug via INTRAVENOUS

## 2024-07-09 MED ORDER — NITROGLYCERIN 1 MG/10 ML FOR IR/CATH LAB
INTRA_ARTERIAL | Status: AC
  Filled 2024-07-09: qty 10

## 2024-07-09 NOTE — Plan of Care (Signed)
 Problem: Education: Goal: Knowledge of General Education information will improve Description: Including pain rating scale, medication(s)/side effects and non-pharmacologic comfort measures 07/09/2024 0518 by Cosme Delwin CROME, RN Outcome: Progressing 07/09/2024 0518 by Cosme Delwin CROME, RN Outcome: Progressing   Problem: Health Behavior/Discharge Planning: Goal: Ability to manage health-related needs will improve 07/09/2024 0518 by Cosme Delwin CROME, RN Outcome: Progressing 07/09/2024 0518 by Cosme Delwin CROME, RN Outcome: Progressing   Problem: Clinical Measurements: Goal: Ability to maintain clinical measurements within normal limits will improve 07/09/2024 0518 by Cosme Delwin CROME, RN Outcome: Progressing 07/09/2024 0518 by Cosme Delwin CROME, RN Outcome: Progressing Goal: Will remain free from infection 07/09/2024 0518 by Cosme Delwin CROME, RN Outcome: Progressing 07/09/2024 0518 by Cosme Delwin CROME, RN Outcome: Progressing Goal: Diagnostic test results will improve 07/09/2024 0518 by Cosme Delwin CROME, RN Outcome: Progressing 07/09/2024 0518 by Cosme Delwin CROME, RN Outcome: Progressing Goal: Respiratory complications will improve 07/09/2024 0518 by Cosme Delwin CROME, RN Outcome: Progressing 07/09/2024 0518 by Cosme Delwin CROME, RN Outcome: Progressing Goal: Cardiovascular complication will be avoided 07/09/2024 0518 by Cosme Delwin CROME, RN Outcome: Progressing 07/09/2024 0518 by Cosme Delwin CROME, RN Outcome: Progressing   Problem: Activity: Goal: Risk for activity intolerance will decrease 07/09/2024 0518 by Cosme Delwin CROME, RN Outcome: Progressing 07/09/2024 0518 by Cosme Delwin CROME, RN Outcome: Progressing   Problem: Nutrition: Goal: Adequate nutrition will be maintained 07/09/2024 0518 by Cosme Delwin CROME, RN Outcome: Progressing 07/09/2024 0518 by Cosme Delwin CROME, RN Outcome: Progressing   Problem: Coping: Goal: Level of anxiety will decrease 07/09/2024 0518 by Cosme Delwin CROME, RN Outcome: Progressing 07/09/2024 0518 by Cosme Delwin CROME, RN Outcome: Progressing   Problem: Elimination: Goal: Will not experience complications related to bowel motility 07/09/2024 0518 by Cosme Delwin CROME, RN Outcome: Progressing 07/09/2024 0518 by Cosme Delwin CROME, RN Outcome: Progressing Goal: Will not experience complications related to urinary retention 07/09/2024 0518 by Cosme Delwin CROME, RN Outcome: Progressing 07/09/2024 0518 by Cosme Delwin CROME, RN Outcome: Progressing   Problem: Pain Managment: Goal: General experience of comfort will improve and/or be controlled 07/09/2024 0518 by Cosme Delwin CROME, RN Outcome: Progressing 07/09/2024 0518 by Cosme Delwin CROME, RN Outcome: Progressing   Problem: Safety: Goal: Ability to remain free from injury will improve 07/09/2024 0518 by Cosme Delwin CROME, RN Outcome: Progressing 07/09/2024 0518 by Cosme Delwin CROME, RN Outcome: Progressing   Problem: Skin Integrity: Goal: Risk for impaired skin integrity will decrease 07/09/2024 0518 by Cosme Delwin CROME, RN Outcome: Progressing 07/09/2024 0518 by Cosme Delwin CROME, RN Outcome: Progressing   Problem: Education: Goal: Understanding of CV disease, CV risk reduction, and recovery process will improve 07/09/2024 0518 by Cosme Delwin CROME, RN Outcome: Progressing 07/09/2024 0518 by Cosme Delwin CROME, RN Outcome: Progressing Goal: Individualized Educational Video(s) 07/09/2024 0518 by Cosme Delwin CROME, RN Outcome: Progressing 07/09/2024 0518 by Cosme Delwin CROME, RN Outcome: Progressing   Problem: Activity: Goal: Ability to return to baseline activity level will improve 07/09/2024 0518 by Cosme Delwin CROME, RN Outcome: Progressing 07/09/2024 0518 by Cosme Delwin CROME, RN Outcome: Progressing   Problem: Cardiovascular: Goal: Ability to achieve and maintain adequate cardiovascular perfusion will improve 07/09/2024 0518 by Cosme Delwin CROME, RN Outcome: Progressing 07/09/2024 0518 by  Cosme Delwin CROME, RN Outcome: Progressing Goal: Vascular access site(s) Level 0-1 will be maintained 07/09/2024 0518 by Cosme Delwin CROME, RN Outcome: Progressing 07/09/2024 0518 by Cosme Delwin CROME, RN Outcome: Progressing   Problem: Health Behavior/Discharge Planning: Goal: Ability to safely manage  health-related needs after discharge will improve 07/09/2024 0518 by Cosme Delwin CROME, RN Outcome: Progressing 07/09/2024 0518 by Cosme Delwin CROME, RN Outcome: Progressing

## 2024-07-09 NOTE — Progress Notes (Signed)
 TR band repositioned by Leanna DOW with this RN's assistance. Repositioned at 1651, reinflated with 8ccs. Reverse barbeau B. Will continue deflation at approximately 1700.

## 2024-07-09 NOTE — Progress Notes (Signed)
 PHARMACY - ANTICOAGULATION CONSULT NOTE  Pharmacy Consult for heparin  Indication: chest pain/ACS  Allergies  Allergen Reactions   Codeine Itching   Hydrocodone-Acetaminophen  Hives and Itching   Lipitor [Atorvastatin] Other (See Comments)    Myalgia--all statins per pt   Amoxicillin  Rash   Latex Itching    Patient Measurements: Height: 5' 6 (167.6 cm) Weight: 78.7 kg (173 lb 8 oz) IBW/kg (Calculated) : 63.8  Vital Signs: Temp: 97.7 F (36.5 C) (08/11 0455) Temp Source: Oral (08/11 0455) BP: 134/72 (08/11 0455) Pulse Rate: 45 (08/11 0455)  Labs: Recent Labs     0000 07/06/24 1854 07/06/24 2040 07/06/24 2222 07/07/24 0256 07/07/24 2211 07/08/24 0302 07/08/24 1301 07/09/24 0306  HGB   < >  --   --   --  12.1*  --  12.4*  --  12.8*  HCT  --   --   --   --  36.8*  --  37.9*  --  38.4*  PLT  --   --   --   --  149*  --  148*  --  172  HEPARINUNFRC  --   --   --   --  0.18*   < > 0.79* 0.41 0.33  CREATININE  --   --   --   --  0.94  --  0.85  --  0.90  TROPONINIHS  --  6 6 7   --   --   --   --   --    < > = values in this interval not displayed.    Estimated Creatinine Clearance: 76.5 mL/min (by C-G formula based on SCr of 0.9 mg/dL).   Medical History: Past Medical History:  Diagnosis Date   Abnormal cardiac CT angiography    Abnormal EKG 03/09/2018   Acquired hypothyroidism 03/07/2018   Allergy    ANA positive 10/02/2020   Formatting of this note might be different from the original.  Homogenous. Other tests negartive. Probably false.  See note 10/02/2020   Aortic atherosclerosis (HCC) 07/17/2020   Formatting of this note might be different from the original. Seen on CT [facility] 06/2019   Arthritis    Asthma    CAD (coronary artery disease), native coronary artery    Cataract    Chronic gout without tophus 03/07/2018   Coronary artery calcification seen on CAT scan 07/17/2020   Formatting of this note might be different from the original. 06/2019. [facility]    Coronary artery disease involving native coronary artery of native heart without angina pectoris 07/17/2020   Formatting of this note might be different from the original.  Seen on CT [facility] 06/2019     Formatting of this note might be different from the original.  Status post four-vessel bypass     Formatting of this note might be different from the original.  Status post four-vessel bypass 09/2020   Coronary artery disease of native artery of native heart with stable angina pectoris (HCC)    Cough syncope 1999   Dental caries    Depression 03/07/2018   Diplopia 10/11/2018   DISH (diffuse idiopathic skeletal hyperostosis) 07/17/2020   Formatting of this note might be different from the original. Seen on CT [facility] 06/2019   Dizzy 05/09/2018   Gastroesophageal reflux disease without esophagitis 03/07/2018   Formatting of this note might be different from the original. Long term   GERD (gastroesophageal reflux disease) 03/07/2018   Globus sensation 09/22/2022   Gout 03/07/2018   Hay  fever    Heel spur, left 02/07/2020   History of asthma 11/06/2020   History of iron deficiency anemia 02/13/2022   History of smokeless tobacco use 07/17/2020   Formatting of this note might be different from the original.  Discussed chantix.  He wants to use. Did well in past. Off label, he understands.     Last Assessment & Plan:   Formatting of this note might be different from the original.  Patient has been without smokeless tobacco for 1 week now   Hyperlipidemia 03/07/2018   Hypomagnesemia 01/29/2021   Hypothyroidism (acquired) 03/07/2018   Formatting of this note might be different from the original. 1990s.   Mild cognitive impairment 07/01/2022   Mild episode of recurrent major depressive disorder (HCC) 07/17/2020   Mixed hyperlipidemia 03/07/2018   Myalgia due to statin 08/05/2021   Obesity    Obesity (BMI 30-39.9) 12/05/2020   Ocular myasthenia gravis (HCC) 07/17/2020   Formatting of this note might be  different from the original. Ocular.  Follows at [facility].  Sees neurology and eye doctor.   Orthostatic hypotension 12/19/2020   PAF (paroxysmal atrial fibrillation) (HCC) 11/12/2021   Palpitation 03/08/2018   Plantar fasciitis 02/07/2020   Pneumonia    Presence of Watchman left atrial appendage closure device 11/12/2021   with 24mm Watchman FLX woth Dr. Cindie   Primary osteoarthritis involving multiple joints 07/17/2020   Recurrent sinusitis    S/P CABG x 4 10/27/2020   Smokeless tobacco use 07/17/2020   Formatting of this note might be different from the original. Discussed chantix.  He wants to use. Did well in past. Off label, he understands.   Subcutaneous nodules 01/27/2021   Thrombus of left atrial appendage    Tightness of heel cord, left 02/07/2020   Type 2 diabetes mellitus (HCC) 03/07/2018   Type 2 diabetes mellitus without complication, without long-term current use of insulin  (HCC) 03/07/2018   Formatting of this note might be different from the original. 2014.   Vitamin B12 deficiency 09/25/2020    Medications:  Medications Prior to Admission  Medication Sig Dispense Refill Last Dose/Taking   acetaminophen  (TYLENOL ) 500 MG tablet Take 500-1,000 mg by mouth every 6 (six) hours as needed (for pain.).   Past Month   albuterol  (PROVENTIL ) (2.5 MG/3ML) 0.083% nebulizer solution Take 2.5 mg by nebulization every 6 (six) hours as needed for wheezing or shortness of breath.   Taking As Needed   albuterol  (VENTOLIN  HFA) 108 (90 Base) MCG/ACT inhaler Inhale into the lungs every 6 (six) hours as needed for wheezing or shortness of breath.   Taking As Needed   aspirin  EC 81 MG tablet Take 81 mg by mouth at bedtime.   07/05/2024   Dextran 70-Hypromellose, PF, (ARTIFICIAL TEARS PF) 0.1-0.3 % SOLN Place 1 drop into both eyes 3 (three) times daily as needed (dry/irritated eyes.).   Past Week   fludrocortisone  (FLORINEF ) 0.1 MG tablet TAKE 1 TABLET BY MOUTH DAILY. 90 tablet 0 07/06/2024    levothyroxine  (SYNTHROID ) 137 MCG tablet Take 137 mcg by mouth daily.   07/06/2024   Magnesium  Oxide 250 MG TABS Take 250 mg by mouth daily.   07/06/2024   metFORMIN (GLUCOPHAGE) 1000 MG tablet Take 1,000 mg by mouth 2 (two) times daily with a meal.   07/06/2024   midodrine  (PROAMATINE ) 5 MG tablet Take 1 tablet (5 mg total) by mouth 3 (three) times daily with meals. 270 tablet 3 07/06/2024   mycophenolate  (CELLCEPT ) 500 MG tablet  Take 1,500 mg by mouth 2 (two) times daily.   07/06/2024   Omega-3 Fatty Acids (FISH OIL ULTRA) 1400 MG CAPS Take 1,400 mg by mouth in the morning and at bedtime. (Patient taking differently: Take 1,400 mg by mouth daily.)   07/06/2024   pantoprazole  (PROTONIX ) 40 MG tablet Take 40 mg by mouth at bedtime.   07/05/2024   PARoxetine  (PAXIL ) 40 MG tablet Take 40 mg by mouth daily.   07/06/2024   pyridostigmine  (MESTINON ) 60 MG tablet Take 90 mg by mouth 4 (four) times daily. Take 1 1/2 tablet (90 mg) 4 times daily (Patient taking differently: Take 120 mg by mouth 4 (four) times daily. Take 2 tablets (120 mg) 4 times daily)   07/06/2024   vitamin B-12 (CYANOCOBALAMIN) 1000 MCG tablet Take 1,000 mcg by mouth daily.   07/06/2024   OZEMPIC, 1 MG/DOSE, 4 MG/3ML SOPN Inject 1 mg into the skin once a week. (Patient not taking: Reported on 07/06/2024)   Not Taking   Scheduled:   aspirin  EC  81 mg Oral Daily   ezetimibe   10 mg Oral Daily   fludrocortisone   100 mcg Oral Daily   levothyroxine   125 mcg Oral Daily   midodrine   5 mg Oral TID WC   mycophenolate   1,500 mg Oral BID   pantoprazole   40 mg Oral Daily   PARoxetine   40 mg Oral Daily   pyridostigmine   120 mg Oral QID    Assessment: Pt with a hx of PAF but not on AC. [facility]ed to [facility] for CP. He got 1 dose of lovenox  80mg  x1 around noon on 08/08. Pharmacy dosing IV heparin    -heparin  level 0.33 on 1150 units/hr -plans for cath today  Goal of Therapy:  Heparin  level 0.3-0.7 units/ml Monitor platelets by anticoagulation  protocol: Yes   Plan:  Continue heparin  drip at 1150 units/hr Will follow plans post cath  Prentice Poisson, PharmD Clinical Pharmacist **Pharmacist phone directory can now be found on [email or web address] ([email or web address]).  Listed under [facility].

## 2024-07-09 NOTE — Progress Notes (Signed)
 Progress Note   Patient: Derek Moore FMW:[id number] DOB: [birth year] DOA: 07/06/2024     3 DOS: the patient was seen and examined on 07/09/2024   Brief hospital course: Derek Moore was admitted to the hospital with the working diagnosis of chest pain, acute coronary syndrome.    68 y.o. male with medical history significant of myasthenia gravis, T2DM, coronary artery disease, CABG 2021, atrial fibrillation, who presented with chest pain.  Reported acute chest pain, severe in intensity, pressure like, substernal to epigastric/ right upper quadrant in location, associated with dyspnea, nausea and diaphoresis. Radiated to the left arm.  Symptoms resolved spontaneously after 30 to 45 minutes.  Apparently he had intermittent left arm pain for one week prior, that occurred intermittently at rest and on exertion.  He was transferred from [facility] ED to [facility] for cardiac evaluation.  His vital signs at the time of transfer had a blood pressure of 120/69, HR 46, RR 18 and 02 saturation 100%  Cardiovascular with S1 and S2 present and regular with no gallops, rubs or murmurs Respiratory with no rales or rhonchi, no wheezing Abdomen with no distention  No lower extremity edema.  Na 140, K 4.0 Cl 107 bicarbonate 27 glucose 181 bun 16 cr 0,60  Wbc 5.0 hgb 13,6 plt 158  D dimer 0,29  Troponin I < 0.01, 0.02   BNP 168  AST 73 ALT 29  total bilirubin 0,9 ALK P 85  Lipase 8,872    Chest radiograph result no acute changes CT abdomen and pelvis negative for acute intra abdominal or pelvic pathology. Pancrease negative    10:08 EKG 56 bpm, normal axis, qtc 508, sinus rhythm with PAC, poor RR wave progression, ST depression II, III, aVF, negative T wave lead II, III, avF  12:08 EKG 51 bpm, normal axis, qtc 504, sinus rhythm, poor RR wave progression, ST depression in lead II, III, avF, negative T wave lead I, aVL, V4 to V6.  Patient was place on heparin  drip for anticoagulation and plan for cardiac  catheterization.   08/11 cardiac catheterization   Assessment and Plan: * CAD (coronary artery disease), native coronary artery Chest pain, not exertion related, with inferolateral ischemic changes on EKG. Prior history of CABG (reported not having angina when diagnosed with coronary artery disease) High sensitive troponin negative   08/10 EKG 49 bpm, left axis deviation, interventricular conduction delay, qtc 431, sinus rhythm with no significant ST segment changes, negative T wave lead I aVL, V5 and V6.   Echocardiogram with preserved LV systolic function EF 60 to 65%, no regional wall motion abnormalities, RV systolic function preserved, LA with mild dilatation, mild MR.   Patient was placed on IV heparin  for anticoagulation.  Antiplatelet therapy with aspirin  No statin due to diagnosis of myasthenia gravis.    No B blocker due to risk of bradycardia  Cardiac catheterization today.   PAF (paroxysmal atrial fibrillation) (HCC) Patient currently sinus rhythm, not on AV blockade.  Sp left atrial appendage closure device, not on anticoagulation.   Orthostatic hypotension Continue with midodrine  and fludrocortisone .   Type 2 diabetes mellitus with hyperlipidemia (HCC) Hold on metformin and GLP 1 agonist.  Fasting glucose is 129 mg/dl  Will hold on insulin  therapy for now.   Acquired hypothyroidism Continue levothyroxine    GERD (gastroesophageal reflux disease) Continue pantoprazole    Myasthenia gravis (HCC) Continue with pyridostigmine , no signs of acute decompensation.  Continue with mycophenolate .   Depression Continue with paroxetine   Subjective: Patient with no further chest pain, no dyspnea, no lower extremity edema, PND or orthopnea   Physical Exam: Vitals:   07/09/24 1550 07/09/24 1600 07/09/24 1615 07/09/24 1630  BP: 118/69 131/69 131/70 113/71  Pulse: (!) 44 (!) 43 (!) 47 (!) 47  Resp: 13 12 18 16   Temp:      TempSrc:      SpO2: 100% 97%  100% 99%  Weight:      Height:       Neurology awake and alert ENT with no pallor or icterus Cardiovascular with S1 and S2 present and regular with no gallops, rubs or murmurs Respiratory with no wheezing or rales, no rhonchi  Abdomen with no distention  No lower extremity edema   Data Reviewed:    Family Communication: I spoke with patient's wife at the bedside, we talked in detail about patient's condition, plan of care and prognosis and all questions were addressed.   Disposition: Status is: Inpatient Remains inpatient appropriate because: follow up post PCI  Planned Discharge Destination: Home     Author: Elidia Toribio Furnace, MD 07/09/2024 5:10 PM  For on call review [email or web address].

## 2024-07-09 NOTE — H&P (View-Only) (Signed)
 Progress Note  Patient Name: Derek Moore Date of Encounter: 07/09/2024 [facility] Cardiologist: Kardie Tobb, DO - Patient Profile 68 year old gentleman with history of CAD-CABG (2021 - LIMA-D2, SVG-D1, SVG-OM1, SVG-PDA; LIMA to small for grafting), PAF with watchman seen in consultation by Dr. Dorn Ross on 07/06/2024 for evaluation of chest pain at the request of Dr. Noralee.  He noted intermittent left arm pain over the last week or so which progressed to 8 out of 10 along with mid to right-sided chest pressure and 1 day of admission-lasted 45 minutes..  Presented to [facility] seen by cardiology.  EKG noted to have potentially more prominent T wave inversions ST depressions.  Symptoms felt to be consistent with unstable angina.  Transferred to [facility] for planned cardiac catheterization.  Interval Summary   No further chest pain today.  Resting comfortably.  No dyspnea ------------------------------------------------------------------------------------------------------------ Assessment & Plan  Principal Problem:   CAD (coronary artery disease), native coronary artery Active Problems:   Acute coronary syndrome (HCC)   S/P CABG x 4   Type 2 diabetes mellitus without complication, without long-term current use of insulin  (HCC)   Hyperlipidemia associated with type 2 diabetes mellitus (HCC)   Orthostatic hypotension   Myalgia due to statin   Hypothyroidism (acquired)   Depression   GERD (gastroesophageal reflux disease)   Acquired hypothyroidism   PAF (paroxysmal atrial fibrillation) (HCC)   Myasthenia gravis (HCC)   1.CAD - history of CABG in 2021 LIMA-D2 LAD too small for grafting), SVG-D1, SVG-OM1, SVG-PDA - Reassuring Echo with LVEF 60-65%, no WMAs; Troponin Negative x 3 - R/o MI - continue medical therapy -medical therapy with ASA 81, hep gtt, added zetia  10mg  daily for LDL of 87.  - Plan for Left Heart Catheterization with Possible  Coronary Angiography and Possible PCI today   Informed Consent Shared Decision Making/Informed Consent The risks [stroke (1 in 1000), death (1 in 1000), kidney failure [usually temporary] (1 in 500), bleeding (1 in 200), allergic reaction [possibly serious] (1 in 200)], benefits (diagnostic support and management of coronary artery disease) and alternatives of a cardiac catheterization were discussed in detail with Mr. Grob and he is willing to proceed.    Chronically hypotensive: medical therapy limited by chronic hypotension on midodrine , some low HRs at times as additional reason to avoid beta blockers HLD: - intolerant to statins-statin myalgias).  LDL 87, will add zetia  10mg  daily; will refer to outpatient lipid clinic for additional management.  2. PAF - has watchman device, not on anticoag -Not currently on rate control agent   3. DM-2: Has not had A1c checked since 2021. - Defer management to TRH. - Consider SGLT2 inhibitor   Elevated lipase - 8872 at [facility], repeat here mildly elevated at [address] [facility] CT A/P: was benign  Myasthenia gravis-continue p.o. tingling Hypothyroidism-on Synthroid   Dispo: Pending Results  ------------------------------------------------------------------------------------------------------------------------------------------- Vital Signs Vitals:   07/08/24 2003 07/09/24 0049 07/09/24 0300 07/09/24 0455  BP: 127/73 121/67  134/72  Pulse: (!) 55 (!) 47 (!) 45 (!) 45  Resp: 18 18  16   Temp: 99.5 F (37.5 C) 98.1 F (36.7 C) 97.7 F (36.5 C) 97.7 F (36.5 C)  TempSrc: Oral Oral Oral Oral  SpO2: 100% 93%  99%  Weight:    78.7 kg  Height:        Intake/Output Summary (Last 24 hours) at 07/09/2024 1001 Last data filed at 07/09/2024 9374 Gross per 24 hour  Intake 706.75 ml  Output --  Net 706.75 ml      07/09/2024    4:55 AM 07/08/2024    4:29 AM 07/07/2024    5:35 AM  Last 3 Weights  Weight (lbs) 173 lb 8 oz 174 lb 6.1 oz 175 lb  4.3 oz  Weight (kg) 78.7 kg 79.1 kg 79.5 kg     Cardiac Studies Echocardiogram: Normal LV size and function.  EF 60 to 65%.  No RWMA.  Normal RV.  Unable assess RAP.  Mild LA dilation.  Mild MR.  Normal AoV.  Normal RAP.  (07/07/2024)   Telemetry/ECG  Sinus rhythm- Personally Reviewed  Physical Exam  GEN: No acute distress.   Neck: No JVD Cardiac: RRR, no murmurs, rubs, or gallops.  Respiratory: Clear to auscultation bilaterally. GI: Soft, nontender, non-distended  MS: No edema  For questions or updates, please contact  [facility] Please consult [email or web address] for contact info under         Signed, Alm Clay, MD

## 2024-07-09 NOTE — Interval H&P Note (Signed)
 History and Physical Interval Note:  07/09/2024 1:57 PM  Derek Moore  has presented today for surgery, with the diagnosis of unstable angina.  The various methods of treatment have been discussed with the patient and family. After consideration of risks, benefits and other options for treatment, the patient has consented to  Procedure(s): LEFT HEART CATH AND CORS/GRAFTS ANGIOGRAPHY (N/A) as a surgical intervention.  The patient's history has been reviewed, patient examined, no change in status, stable for surgery.  I have reviewed the patient's chart and labs.  Questions were answered to the patient's satisfaction.    Cath Lab Visit (complete for each Cath Lab visit)  Clinical Evaluation Leading to the Procedure:   ACS: Yes.  (Unstable angina)  Non-ACS:  N/A  Derek Moore

## 2024-07-09 NOTE — Progress Notes (Signed)
 Progress Note  Patient Name: Derek Moore Date of Encounter: 07/09/2024 [facility] Cardiologist: Kardie Tobb, DO - Patient Profile 68 year old gentleman with history of CAD-CABG (2021 - LIMA-D2, SVG-D1, SVG-OM1, SVG-PDA; LIMA to small for grafting), PAF with watchman seen in consultation by Dr. Dorn Ross on 07/06/2024 for evaluation of chest pain at the request of Dr. Noralee.  He noted intermittent left arm pain over the last week or so which progressed to 8 out of 10 along with mid to right-sided chest pressure and 1 day of admission-lasted 45 minutes..  Presented to [facility] seen by cardiology.  EKG noted to have potentially more prominent T wave inversions ST depressions.  Symptoms felt to be consistent with unstable angina.  Transferred to [facility] for planned cardiac catheterization.  Interval Summary   No further chest pain today.  Resting comfortably.  No dyspnea ------------------------------------------------------------------------------------------------------------ Assessment & Plan  Principal Problem:   CAD (coronary artery disease), native coronary artery Active Problems:   Acute coronary syndrome (HCC)   S/P CABG x 4   Type 2 diabetes mellitus without complication, without long-term current use of insulin  (HCC)   Hyperlipidemia associated with type 2 diabetes mellitus (HCC)   Orthostatic hypotension   Myalgia due to statin   Hypothyroidism (acquired)   Depression   GERD (gastroesophageal reflux disease)   Acquired hypothyroidism   PAF (paroxysmal atrial fibrillation) (HCC)   Myasthenia gravis (HCC)   1.CAD - history of CABG in 2021 LIMA-D2 LAD too small for grafting), SVG-D1, SVG-OM1, SVG-PDA - Reassuring Echo with LVEF 60-65%, no WMAs; Troponin Negative x 3 - R/o MI - continue medical therapy -medical therapy with ASA 81, hep gtt, added zetia  10mg  daily for LDL of 87.  - Plan for Left Heart Catheterization with Possible  Coronary Angiography and Possible PCI today   Informed Consent Shared Decision Making/Informed Consent The risks [stroke (1 in 1000), death (1 in 1000), kidney failure [usually temporary] (1 in 500), bleeding (1 in 200), allergic reaction [possibly serious] (1 in 200)], benefits (diagnostic support and management of coronary artery disease) and alternatives of a cardiac catheterization were discussed in detail with Mr. Grob and he is willing to proceed.    Chronically hypotensive: medical therapy limited by chronic hypotension on midodrine , some low HRs at times as additional reason to avoid beta blockers HLD: - intolerant to statins-statin myalgias).  LDL 87, will add zetia  10mg  daily; will refer to outpatient lipid clinic for additional management.  2. PAF - has watchman device, not on anticoag -Not currently on rate control agent   3. DM-2: Has not had A1c checked since 2021. - Defer management to TRH. - Consider SGLT2 inhibitor   Elevated lipase - 8872 at [facility], repeat here mildly elevated at [address] [facility] CT A/P: was benign  Myasthenia gravis-continue p.o. tingling Hypothyroidism-on Synthroid   Dispo: Pending Results  ------------------------------------------------------------------------------------------------------------------------------------------- Vital Signs Vitals:   07/08/24 2003 07/09/24 0049 07/09/24 0300 07/09/24 0455  BP: 127/73 121/67  134/72  Pulse: (!) 55 (!) 47 (!) 45 (!) 45  Resp: 18 18  16   Temp: 99.5 F (37.5 C) 98.1 F (36.7 C) 97.7 F (36.5 C) 97.7 F (36.5 C)  TempSrc: Oral Oral Oral Oral  SpO2: 100% 93%  99%  Weight:    78.7 kg  Height:        Intake/Output Summary (Last 24 hours) at 07/09/2024 1001 Last data filed at 07/09/2024 9374 Gross per 24 hour  Intake 706.75 ml  Output --  Net 706.75 ml      07/09/2024    4:55 AM 07/08/2024    4:29 AM 07/07/2024    5:35 AM  Last 3 Weights  Weight (lbs) 173 lb 8 oz 174 lb 6.1 oz 175 lb  4.3 oz  Weight (kg) 78.7 kg 79.1 kg 79.5 kg     Cardiac Studies Echocardiogram: Normal LV size and function.  EF 60 to 65%.  No RWMA.  Normal RV.  Unable assess RAP.  Mild LA dilation.  Mild MR.  Normal AoV.  Normal RAP.  (07/07/2024)   Telemetry/ECG  Sinus rhythm- Personally Reviewed  Physical Exam  GEN: No acute distress.   Neck: No JVD Cardiac: RRR, no murmurs, rubs, or gallops.  Respiratory: Clear to auscultation bilaterally. GI: Soft, nontender, non-distended  MS: No edema  For questions or updates, please contact  [facility] Please consult [email or web address] for contact info under         Signed, Alm Clay, MD

## 2024-07-09 NOTE — Plan of Care (Signed)

## 2024-07-09 NOTE — Assessment & Plan Note (Signed)
 Has been intolerant of multiple different statins in the past: Atorvastatin, simvastatin and rosuvastatin .  Noted significant myalgias.

## 2024-07-09 NOTE — Telephone Encounter (Signed)
 Patient Product/process development scientist completed.    The patient is insured through [facility]. Patient has Medicare and is not eligible for a copay card, but may be able to apply for patient assistance or Medicare RX Payment Plan (Patient Must reach out to their plan, if eligible for payment plan), if available.    Ran test claim for Farxiga 10 mg and the current 30 day co-pay is $45.00.  Ran test claim for Jardiance 10 mg and the current 30 day co-pay is $45.00.  This test claim was processed through [facility]- copay amounts may vary at other pharmacies due to pharmacy/plan contracts, or as the patient moves through the different stages of their insurance plan.     Reyes Sharps, CPHT Pharmacy Technician III Certified Patient Advocate [facility] Patient Advocate Team Direct Number: [phone]  Fax: [phone]

## 2024-07-10 ENCOUNTER — Encounter (HOSPITAL_COMMUNITY): Payer: Self-pay | Admitting: Internal Medicine

## 2024-07-10 ENCOUNTER — Telehealth: Payer: Self-pay | Admitting: Cardiology

## 2024-07-10 ENCOUNTER — Other Ambulatory Visit (HOSPITAL_COMMUNITY): Payer: Self-pay

## 2024-07-10 DIAGNOSIS — I951 Orthostatic hypotension: Secondary | ICD-10-CM | POA: Diagnosis not present

## 2024-07-10 DIAGNOSIS — E119 Type 2 diabetes mellitus without complications: Secondary | ICD-10-CM

## 2024-07-10 DIAGNOSIS — T466X5A Adverse effect of antihyperlipidemic and antiarteriosclerotic drugs, initial encounter: Secondary | ICD-10-CM

## 2024-07-10 DIAGNOSIS — M791 Myalgia, unspecified site: Secondary | ICD-10-CM

## 2024-07-10 DIAGNOSIS — E1169 Type 2 diabetes mellitus with other specified complication: Secondary | ICD-10-CM | POA: Diagnosis not present

## 2024-07-10 DIAGNOSIS — I25110 Atherosclerotic heart disease of native coronary artery with unstable angina pectoris: Secondary | ICD-10-CM | POA: Diagnosis not present

## 2024-07-10 DIAGNOSIS — Z951 Presence of aortocoronary bypass graft: Secondary | ICD-10-CM

## 2024-07-10 DIAGNOSIS — I480 Paroxysmal atrial fibrillation: Secondary | ICD-10-CM | POA: Diagnosis not present

## 2024-07-10 LAB — BASIC METABOLIC PANEL WITH GFR
Anion gap: 11 (ref 5–15)
BUN: 12 mg/dL (ref 8–23)
CO2: 24 mmol/L (ref 22–32)
Calcium: 9 mg/dL (ref 8.9–10.3)
Chloride: 105 mmol/L (ref 98–111)
Creatinine, Ser: 0.93 mg/dL (ref 0.61–1.24)
GFR, Estimated: >60 mL/min (ref >60)
Glucose, Bld: 123 mg/dL — ABNORMAL HIGH (ref 70–99)
Potassium: 3.5 mmol/L (ref 3.5–5.1)
Sodium: 140 mmol/L (ref 135–145)

## 2024-07-10 LAB — CBC
HCT: 38 % — ABNORMAL LOW (ref 39.0–52.0)
Hemoglobin: 12.7 g/dL — ABNORMAL LOW (ref 13.0–17.0)
MCH: 30.2 pg (ref 26.0–34.0)
MCHC: 33.4 g/dL (ref 30.0–36.0)
MCV: 90.5 fL (ref 80.0–100.0)
Platelets: 163 K/uL (ref 150–400)
RBC: 4.2 MIL/uL — ABNORMAL LOW (ref 4.22–5.81)
RDW: 14.2 % (ref 11.5–15.5)
WBC: 7.1 K/uL (ref 4.0–10.5)
nRBC: 0 % (ref 0.0–0.2)

## 2024-07-10 LAB — HELIX PHARMACOGENOMICS (PGX) CLOPIDOGREL TEST

## 2024-07-10 MED ORDER — ORAL CARE MOUTH RINSE: 15.0000 mL | OROMUCOSAL | Status: DC | PRN

## 2024-07-10 MED ORDER — ASPIRIN 81 MG PO TBEC
81.0000 mg | DELAYED_RELEASE_TABLET | Freq: Every evening | ORAL | 0 refills | Status: AC
  Filled 2024-07-10: qty 30, 30d supply, fill #0

## 2024-07-10 MED ORDER — EZETIMIBE 10 MG PO TABS
10.0000 mg | ORAL_TABLET | Freq: Every day | ORAL | 0 refills | Status: DC
  Filled 2024-07-10: qty 30, 30d supply, fill #0

## 2024-07-10 MED ORDER — PRASUGREL HCL 10 MG PO TABS
10.0000 mg | ORAL_TABLET | Freq: Every day | ORAL | 0 refills | Status: DC
  Filled 2024-07-10: qty 30, 30d supply, fill #0

## 2024-07-10 NOTE — Care Management Important Message (Signed)
 Important Message  Patient Details  Name: Derek Moore MRN: [id number] Date of Birth: 1956-10-01   Important Message Given:  Yes - Medicare IM     Claretta Deed 07/10/2024, 4:20 PM

## 2024-07-10 NOTE — Discharge Summary (Addendum)
 Physician Discharge Summary   Patient: Derek Moore DOBIE MRN: [id number] DOB: [birth year]  Admit date:     07/06/2024  Discharge date: 07/10/24  Discharge Physician: Elidia Sieving Dax Murguia   PCP: Thurmond Cathlyn LABOR., MD   Recommendations at discharge:    Patient has been placed on dual antiplatelet therapy with aspirin  and prasugrel  for 12 months, after one year can covert to clopidogrel  monotherapy. Started ezetimibe  for dyslipidemia. Not able to use statin due to Myasthenia Gravis. Possible use of PCSK9 Inh as outpatient.  Follow up with Dr Thurmond in 7 to 10 days Follow up with Cardiology as scheduled.    Discharge Diagnoses: Principal Problem:   CAD (coronary artery disease), native coronary artery Active Problems:   PAF (paroxysmal atrial fibrillation) (HCC)   Orthostatic hypotension   Acquired hypothyroidism   GERD (gastroesophageal reflux disease)   Myasthenia gravis (HCC)   Depression   Type 2 diabetes mellitus without complication, without long-term current use of insulin  (HCC)   Hypothyroidism (acquired)   Hyperlipidemia associated with type 2 diabetes mellitus (HCC)   S/P CABG x 4   Myalgia due to statin   Acute coronary syndrome (HCC)  Resolved Problems:   * No resolved hospital problems. [facility] Course: Mr. [name] was admitted to the hospital with the working diagnosis of chest pain, with acute coronary syndrome.   68 y.o. male with medical history significant of myasthenia gravis, T2DM, coronary artery disease, CABG 2021, atrial fibrillation, who presented with chest pain.  Reported acute chest pain, severe in intensity, pressure like, substernal to epigastric/ right upper quadrant in location, associated with dyspnea, nausea and diaphoresis. Radiated to the left arm.  Symptoms resolved spontaneously after 30 to 45 minutes.  Apparently he had intermittent left arm pain for one week prior, that occurred intermittently at rest and on exertion.  He was transferred  from [facility] ED to [facility] for cardiac evaluation.  His vital signs at the time of transfer had a blood pressure of 120/69, HR 46, RR 18 and 02 saturation 100%  Cardiovascular with S1 and S2 present and regular with no gallops, rubs or murmurs Respiratory with no rales or rhonchi, no wheezing Abdomen with no distention  No lower extremity edema.  Na 140, K 4.0 Cl 107 bicarbonate 27 glucose 181 bun 16 cr 0,60  Wbc 5.0 hgb 13,6 plt 158  D dimer 0,29  Troponin I < 0.01, 0.02   BNP 168  AST 73 ALT 29  total bilirubin 0,9 ALK P 85  Lipase 8,872    Chest radiograph result no acute changes CT abdomen and pelvis negative for acute intra abdominal or pelvic pathology. Pancrease negative    10:08 EKG 56 bpm, normal axis, qtc 508, sinus rhythm with PAC, poor RR wave progression, ST depression II, III, aVF, negative T wave lead II, III, avF  12:08 EKG 51 bpm, normal axis, qtc 504, sinus rhythm, poor RR wave progression, ST depression in lead II, III, avF, negative T wave lead I, aVL, V4 to V6.  Patient was place on heparin  drip for anticoagulation and plan for cardiac catheterization.   08/11 cardiac catheterization, underwent PCI of proximal/mid LCx and OM1.  08/12 discharge home, continue medical therapy including dual antiplatelet therapy.    Assessment and Plan: * CAD (coronary artery disease), native coronary artery Unstable angina,  Chest pain, not exertion related, with inferolateral ischemic changes on EKG. Prior history of CABG (reported not having angina when diagnosed with coronary artery disease) High sensitive  troponin negative   08/10 EKG 49 bpm, left axis deviation, interventricular conduction delay, qtc 431, sinus rhythm with no significant ST segment changes, negative T wave lead I aVL, V5 and V6.   Echocardiogram with preserved LV systolic function EF 60 to 65%, no regional wall motion abnormalities, RV systolic function preserved, LA with mild dilatation, mild MR.   Patient  was placed on IV heparin  for anticoagulation.  Antiplatelet therapy with aspirin  No statin due to diagnosis of myasthenia gravis.    No B blocker due to risk of bradycardia  08/11 cardiac catheterization, with severe native coronary artery disease, including 80% proximal LAD stenosis with competitive flow from LIMA D2, sequential 60%, 90% and mid 40% mid/distal LCx lesions, 90% OM1 and 99 OM2 stenoses, chronic occlusion of mid/ distal RCA.   Patient underwent successful bifurcation PCI of proximal/ mid LCx and OM1, drug eluding stent, in LCx and angioplasty balloon to OM1.  Recommendation to continue dual antiplatelet therapy aspirin  and prasugrel  for up to 12 months, consider switching to clopidogrel  to minimize risk if CYP2C19 genotype is favorable.   After one year would convert to clopidogrel  monotherapy, (SAPT) to complete 2 years.   Aggressive secondary prevention of CAD. Favof medical therapy of severe OM2 stenosis, which is not favorable for PCI.   PAF (paroxysmal atrial fibrillation) (HCC) Patient currently sinus rhythm, not on AV blockade.  Sp left atrial appendage closure device, not on anticoagulation.   Orthostatic hypotension Continue with midodrine  and fludrocortisone .   Type 2 diabetes mellitus with hyperlipidemia (HCC) Hold on metformin and GLP 1 agonist.  Fasting glucose is 129 mg/dl  Will hold on insulin  therapy for now.   Acquired hypothyroidism Continue levothyroxine    GERD (gastroesophageal reflux disease) Continue pantoprazole    Myasthenia gravis (HCC) Continue with pyridostigmine , no signs of acute decompensation.  Continue with mycophenolate .   Depression Continue with paroxetine    Consultants: Cardiology  Procedures performed: cardiac catheterization   Disposition: Home Diet recommendation:  Cardiac and Carb modified diet DISCHARGE MEDICATION: Allergies as of 07/10/2024       Reactions   Codeine Itching   Hydrocodone-acetaminophen  Hives,  Itching   Lipitor [atorvastatin] Other (See Comments)   Myalgia--all statins per pt   Amoxicillin  Rash   Latex Itching        Medication List     TAKE these medications    acetaminophen  500 MG tablet Commonly known as: TYLENOL  Take 500-1,000 mg by mouth every 6 (six) hours as needed (for pain.).   albuterol  108 (90 Base) MCG/ACT inhaler Commonly known as: VENTOLIN  HFA Inhale into the lungs every 6 (six) hours as needed for wheezing or shortness of breath.   albuterol  (2.5 MG/3ML) 0.083% nebulizer solution Commonly known as: PROVENTIL  Take 2.5 mg by nebulization every 6 (six) hours as needed for wheezing or shortness of breath.   Artificial Tears PF 0.1-0.3 % Soln Generic drug: Dextran 70-Hypromellose (PF) Place 1 drop into both eyes 3 (three) times daily as needed (dry/irritated eyes.).   aspirin  EC 81 MG tablet Take 1 tablet (81 mg total) by mouth at bedtime.   cyanocobalamin 1000 MCG tablet Commonly known as: VITAMIN B12 Take 1,000 mcg by mouth daily.   ezetimibe  10 MG tablet Commonly known as: ZETIA  Take 1 tablet (10 mg total) by mouth daily.   Fish Oil Ultra 1400 MG Caps Take 1,400 mg by mouth in the morning and at bedtime. What changed: when to take this   fludrocortisone  0.1 MG tablet Commonly known as:  FLORINEF  TAKE 1 TABLET BY MOUTH DAILY.   levothyroxine  137 MCG tablet Commonly known as: SYNTHROID  Take 137 mcg by mouth daily.   Magnesium  Oxide 250 MG Tabs Take 250 mg by mouth daily.   metFORMIN 1000 MG tablet Commonly known as: GLUCOPHAGE Take 1,000 mg by mouth 2 (two) times daily with a meal.   midodrine  5 MG tablet Commonly known as: PROAMATINE  Take 1 tablet (5 mg total) by mouth 3 (three) times daily with meals.   mycophenolate  500 MG tablet Commonly known as: CELLCEPT  Take 1,500 mg by mouth 2 (two) times daily.   Ozempic (1 MG/DOSE) 4 MG/3ML Sopn Generic drug: Semaglutide (1 MG/DOSE) Inject 1 mg into the skin once a week.    pantoprazole  40 MG tablet Commonly known as: PROTONIX  Take 40 mg by mouth at bedtime.   PARoxetine  40 MG tablet Commonly known as: PAXIL  Take 40 mg by mouth daily.   prasugrel  10 MG Tabs tablet Commonly known as: EFFIENT  Take 1 tablet (10 mg total) by mouth daily. Start taking on: July 11, 2024   pyridostigmine  60 MG tablet Commonly known as: MESTINON  Take 90 mg by mouth 4 (four) times daily. Take 1 1/2 tablet (90 mg) 4 times daily What changed:  how much to take additional instructions        Discharge Exam: Filed Weights   07/08/24 0429 07/09/24 0455 07/09/24 1751  Weight: 79.1 kg 78.7 kg 79 kg   BP 122/68 (BP Location: Right Arm)   Pulse (!) 48   Temp 97.8 F (36.6 C) (Oral)   Resp 19   Ht 5' 6 (1.676 m)   Wt 79 kg   SpO2 98%   BMI 28.11 kg/m   Patient is feeling well, no chest pain and no dyspnea, has been out of bed and ambulating   Neurology awake and alert ENT with no pallor Cardiovascular with S1 and S2 present and regular with no gallops, rubs or murmurs No JVD Respiratory with no rales or wheezing, no rhonchi  Abdomen with no distention  No lower extremity edema  Condition at discharge: stable  The results of significant diagnostics from this hospitalization (including imaging, microbiology, ancillary and laboratory) are listed below for reference.   Imaging Studies: CARDIAC CATHETERIZATION Result Date: 07/09/2024 Conclusions: Severe native coronary artery disease, including 80% proximal LAD stenosis with competitive flow from LIMA-D2 that backfills the distal LAD, sequential 60% ostial (RFR 0.99), 90% mid, and 40% mid/distal LCx lesions, 90% OM1 and 99% OM2 stenoses, and chronic total occlusion of mid/distal RCA. Widely patent LIMA-D2, SVG-D1, and SVG-rPDA. Occluded SVG-OM1. Mildly elevated left ventricular filling pressure (LVEDP 16 mmHg). Successful bifurcation PCI of proximal/mid LCx and OM1 using provisional technique with placement of  Synergy XD 2.75 x 12 mm drug-eluting stent in LCx and angioplasty of OM1 using 2.25 mm Blythe balloon.  Final angiogram demonstrates 0% residual stenosis in LCx with TIMI-3 flow; there is 30% ostial OM1 stenosis with non-flow-limiting dissection that was not stented. Conclusions: Dual antiplatelet therapy with aspirin  and prasugrel  for up to 12 months; consider switching to clopidogrel  to minimize bleeding risk if CYP2C19 genotype is favorable. Aggressive secondary prevention of CAD; favor medical management of severe OM2 stenosis, with is not favorable for PCI. Lonni Hanson, MD [facility]  ECHOCARDIOGRAM COMPLETE Result Date: 07/07/2024    ECHOCARDIOGRAM REPORT   Patient Name:   TERRANCE USERY Date of Exam: 07/07/2024 Medical Rec #:  [id number]         Height:  66.0 in Accession #:    [id number]        Weight:       175.3 lb Date of Birth:  05-28-1956        BSA:          1.891 m Patient Age:    38 years          BP:           118/68 mmHg Patient Gender: M                 HR:           50 bpm. Exam Location:  Inpatient Procedure: 2D Echo (Both Spectral and Color Flow Doppler were utilized during            procedure). Indications:    chest pain  History:        Patient has prior history of Echocardiogram examinations, most                 recent 12/29/2021. Prior CABG; Risk Factors:Diabetes,                 Dyslipidemia and Former Smoker.  Sonographer:    Tinnie Barefoot RDCS Referring Phys: [id number] Evelean Bigler DANIEL Kadeen Sroka IMPRESSIONS  1. Left ventricular ejection fraction, by estimation, is 60 to 65%. The left ventricle has normal function. The left ventricle has no regional wall motion abnormalities. Left ventricular diastolic parameters were normal.  2. Right ventricular systolic function is normal. The right ventricular size is normal. Tricuspid regurgitation signal is inadequate for assessing PA pressure.  3. Left atrial size was mildly dilated.  4. The mitral valve is abnormal. Mild mitral valve  regurgitation. No evidence of mitral stenosis.  5. The aortic valve is tricuspid. Aortic valve regurgitation is not visualized. No aortic stenosis is present.  6. The inferior vena cava is normal in size with greater than 50% respiratory variability, suggesting right atrial pressure of 3 mmHg. FINDINGS  Left Ventricle: Left ventricular ejection fraction, by estimation, is 60 to 65%. The left ventricle has normal function. The left ventricle has no regional wall motion abnormalities. The left ventricular internal cavity size was normal in size. There is  no left ventricular hypertrophy. Left ventricular diastolic parameters were normal. Right Ventricle: The right ventricular size is normal. Right vetricular wall thickness was not well visualized. Right ventricular systolic function is normal. Tricuspid regurgitation signal is inadequate for assessing PA pressure. Left Atrium: Left atrial size was mildly dilated. Right Atrium: Right atrial size was normal in size. Pericardium: There is no evidence of pericardial effusion. Mitral Valve: The mitral valve is abnormal. Mild mitral valve regurgitation. No evidence of mitral valve stenosis. Tricuspid Valve: The tricuspid valve is normal in structure. Tricuspid valve regurgitation is trivial. No evidence of tricuspid stenosis. Aortic Valve: The aortic valve is tricuspid. Aortic valve regurgitation is not visualized. No aortic stenosis is present. Aortic valve mean gradient measures 5.5 mmHg. Aortic valve peak gradient measures 12.1 mmHg. Aortic valve area, by VTI measures 1.81  cm. Pulmonic Valve: The pulmonic valve was not well visualized. Pulmonic valve regurgitation is not visualized. No evidence of pulmonic stenosis. Aorta: The aortic root is normal in size and structure. Venous: The inferior vena cava is normal in size with greater than 50% respiratory variability, suggesting right atrial pressure of 3 mmHg. IAS/Shunts: No atrial level shunt detected by color flow  Doppler.  LEFT VENTRICLE PLAX 2D LVIDd:  5.70 cm LVIDs:         3.80 cm LV PW:         1.00 cm LV IVS:        1.00 cm LVOT diam:     2.10 cm LV SV:         77 LV SV Index:   41 LVOT Area:     3.46 cm  RIGHT VENTRICLE             IVC RV Basal diam:  2.90 cm     IVC diam: 2.20 cm RV S prime:     11.60 cm/s TAPSE (M-mode): 1.8 cm LEFT ATRIUM             Index        RIGHT ATRIUM           Index LA diam:        4.40 cm 2.33 cm/m   RA Area:     16.10 cm LA Vol (A2C):   73.8 ml 39.03 ml/m  RA Volume:   43.80 ml  23.17 ml/m LA Vol (A4C):   70.2 ml 37.13 ml/m LA Biplane Vol: 75.5 ml 39.93 ml/m  AORTIC VALVE AV Area (Vmax):    1.85 cm AV Area (Vmean):   1.98 cm AV Area (VTI):     1.81 cm AV Vmax:           173.74 cm/s AV Vmean:          106.604 cm/s AV VTI:            0.428 m AV Peak Grad:      12.1 mmHg AV Mean Grad:      5.5 mmHg LVOT Vmax:         92.70 cm/s LVOT Vmean:        60.900 cm/s LVOT VTI:          0.223 m LVOT/AV VTI ratio: 0.52  AORTA Ao Root diam: 3.10 cm Ao Asc diam:  3.10 cm MITRAL VALVE MV Area (PHT): 2.87 cm     SHUNTS MV Decel Time: 264 msec     Systemic VTI:  0.22 m MR Peak grad: 93.3 mmHg     Systemic Diam: 2.10 cm MR Mean grad: 66.0 mmHg MR Vmax:      483.00 cm/s MR Vmean:     391.0 cm/s MV E velocity: 115.00 cm/s MV A velocity: 78.50 cm/s MV E/A ratio:  1.46 Dorn Ross MD Electronically signed by Dorn Ross MD Signature Date/Time: 07/07/2024/6:08:51 PM    Final     Microbiology: Results for orders placed or performed during the hospital encounter of 07/06/24  MRSA Next Gen by PCR, Nasal     Status: None   Collection Time: 07/09/24  6:07 PM   Specimen: Nasal Mucosa; Nasal Swab  Result Value Ref Range Status   MRSA by PCR Next Gen NOT DETECTED NOT DETECTED Final    Comment: (NOTE) The GeneXpert MRSA Assay (FDA approved for NASAL specimens only), is one component of a comprehensive MRSA colonization surveillance program. It is not intended to diagnose MRSA infection  nor to guide or monitor treatment for MRSA infections. Test performance is not FDA approved in patients less than 20 years old. Performed at [facility], [address] [place], [place] [address]     Labs: CBC: Recent Labs  Lab 07/07/24 0256 07/08/24 0302 07/09/24 0306 07/10/24 0342  WBC 6.2 6.4 7.4 7.1  HGB 12.1* 12.4*  12.8* 12.7*  HCT 36.8* 37.9* 38.4* 38.0*  MCV 90.2 90.0 90.1 90.5  PLT 149* 148* 172 163   Basic Metabolic Panel: Recent Labs  Lab 07/07/24 0256 07/08/24 0302 07/09/24 0306 07/10/24 0342  NA 140 143 140 140  K 3.8 3.5 3.5 3.5  CL 109 109 107 105  CO2 24 24 24 24   GLUCOSE 121* 129* 132* 123*  BUN 14 13 12 12   CREATININE 0.94 0.85 0.90 0.93  CALCIUM  8.9 9.1 9.3 9.0   Liver Function Tests: Recent Labs  Lab 07/07/24 0256  AST 103*  ALT 87*  ALKPHOS 86  BILITOT 0.4  PROT 5.6*  ALBUMIN  3.3*   CBG: Recent Labs  Lab 07/09/24 1608  GLUCAP 98    Discharge time spent: greater than 30 minutes.  Signed: Elidia Toribio Furnace, MD [facility] 07/10/2024

## 2024-07-10 NOTE — TOC Transition Note (Signed)
 Transition of Care [facility]) - Discharge Note   Patient Details  Name: Derek Moore MRN: [id number] Date of Birth: [birth year]  Transition of Care [facility]) CM/SW Contact:  Roxie KANDICE Stain, RN Phone Number: 07/10/2024, 10:58 AM   Clinical Narrative:    [name] is stable to discharge home.  Patient to follow up with PCP. No TOC needs at this time.    Final next level of care: Home/Self Care Barriers to Discharge: Barriers Resolved   Patient Goals and CMS Choice Patient states their goals for this hospitalization and ongoing recovery are:: return          Discharge Placement                   Home    Discharge Plan and Services Additional resources added to the After Visit Summary for                                       Social Drivers of Health (SDOH) Interventions SDOH Screenings   Food Insecurity: No Food Insecurity (07/06/2024)  Housing: Low Risk  (07/06/2024)  Transportation Needs: No Transportation Needs (07/06/2024)  Utilities: Not At Risk (07/06/2024)  Financial Resource Strain: Low Risk  (06/24/2022)   Received from [facility]  Physical Activity: Sufficiently Active (06/24/2022)   Received from [facility], [facility] visits prior to 01/29/2023.  Social Connections: Moderately Isolated (07/06/2024)  Stress: No Stress Concern Present (06/24/2022)   Received from [facility], [facility] visits prior to 01/29/2023.  Tobacco Use: High Risk (07/06/2024)     Readmission Risk Interventions    07/10/2024   10:58 AM  Readmission Risk Prevention Plan  Post Dischage Appt Complete  Medication Screening Complete  Transportation Screening Complete

## 2024-07-10 NOTE — TOC CM/SW Note (Signed)
 Transition of Care [facility]) - Inpatient Brief Assessment   Patient Details  Name: Derek Moore MRN: [id number] Date of Birth: 04-06-1956  Transition of Care [facility]) CM/SW Contact:    Lauraine FORBES Saa, LCSWA Phone Number: 07/10/2024, 9:12 AM   Clinical Narrative:  9:12 AM Per chart review, patient resides at home with spouse. Patient has a PCP and insurance. Patient does not have SNF/HH/DME history. Patient's preferred pharmacy's are [facility] [place], [facility], [facility] [place], and [facility] ([facility] [place]. No TOC needs were identified at this time. TOC will continue to follow and be available to assist.  Transition of Care Asessment: Insurance and Status: Insurance coverage has been reviewed Patient has primary care physician: Yes Home environment has been reviewed: Private Residence Prior level of function:: N/A Prior/Current Home Services: No current home services Social Drivers of Health Review: SDOH reviewed no interventions necessary Readmission risk has been reviewed: Yes (Currently Green 10%) Transition of care needs: no transition of care needs at this time

## 2024-07-10 NOTE — Progress Notes (Signed)
 CARDIAC REHAB PHASE I     Pt ambulating in room independently. Tolerating well with no CP, SOB or dizziness. Post stent education including restrictions, risk factors, exercise guidelines, antiplatelet therapy importance, MI booklet, NTG use, heart healthy diabetic diet, tobacco cessation and CRP2 reviewed. All questions and concerns addressed. Will refer to [place] for CRP2. Plan for discharge home later today.    [id number] Vaughn Asberry Hacking, RN BSN 07/10/2024 9:47 AM

## 2024-07-10 NOTE — Progress Notes (Signed)
 Progress Note  Patient Name: Derek Moore Date of Encounter: 07/10/2024 [facility] Cardiologist: Kardie Tobb, DO - Patient Profile 68 year old gentleman with history of CAD-CABG (2021 - LIMA-D2, SVG-D1, SVG-OM1, SVG-PDA; LIMA to small for grafting), PAF with watchman seen in consultation by Dr. Dorn Ross on 07/06/2024 for evaluation of chest pain at the request of Dr. Noralee.  He noted intermittent left arm pain over the last week or so which progressed to 8 out of 10 along with mid to right-sided chest pressure and 1 day of admission-lasted 45 minutes..  Presented to [facility] seen by cardiology.  EKG noted to have potentially more prominent T wave inversions ST depressions.  Symptoms felt to be consistent with unstable angina.  Transferred to [facility] for planned cardiac catheterization.  Interval Summary   Feels good today.  No further chest pain.  Has been up walking around doing well. ------------------------------------------------------------------------------------------------------------ Assessment & Plan  Principal Problem:   CAD (coronary artery disease), native coronary artery Active Problems:   Acute coronary syndrome (HCC)   S/P CABG x 4   Type 2 diabetes mellitus without complication, without long-term current use of insulin  (HCC)   Hyperlipidemia associated with type 2 diabetes mellitus (HCC)   Orthostatic hypotension   Myalgia due to statin   Hypothyroidism (acquired)   Depression   GERD (gastroesophageal reflux disease)   Acquired hypothyroidism   PAF (paroxysmal atrial fibrillation) (HCC)   Myasthenia gravis (HCC)   1.CAD - history of CABG in 2021 LIMA-D2 LAD too small for grafting), SVG-D1, SVG-OM1, SVG-PDA - Reassuring Echo with LVEF 60-65%, no WMAs; Troponin Negative x 3 - R/o MI Cath revealed likely subacute occlusion of SVG-OM 1 with severe lesion just proximal to the the takeoff of OM 1 and small caliber OM 2 with  significant stenosis and role for PCI. => PCI to LCx with PTCA of ostial OM1. => plan DAPT per recommendation ASA/prasugrel  x 1 year (okay to convert to Plavix  if CYP 2 C19 genotype is favorable.) => After 1 year would convert to clopidogrel  monotherapy (SAPT)to complete your 2 GDMT limited by baseline hypotension requiring midodrine  at home. => Unable to use beta-blocker or ARB/ACE inhibitor. HLD: -Statin myalgias.  LDL 87, => started on Zetia  with plan for outpatient lipid clinic evaluation for PCSK9 inhibitor/Leqvio.  2. PAF -> not on rate control agents due to hypotension and well-controlled rate.  No breakthrough spells.  Not on anticoagulation due to watchman.   3. DM-2: Has not had A1c checked since 2021. => Ordered this hospitalization.  Pending - Defer management to TRH. - Consider SGLT2 inhibitor in the outpatient setting.  Elevated lipase - 8872 at [facility], repeat here mildly elevated at [address] [facility] A/P: was benign  Myasthenia gravis-continue p.o. tingling Hypothyroidism-on Synthroid   Dispo: Okay for discharge today from cardiac standpoint; we are arranging outpatient follow-up with Dr. Monetta  ------------------------------------------------------------------------------------------------------------------------------------------- Vital Signs Vitals:   07/09/24 2200 07/10/24 0008 07/10/24 0347 07/10/24 0752  BP: 117/74 103/73 111/74 122/68  Pulse: (!) 51 (!) 49 (!) 48   Resp: 17 16 14 19   Temp:  98 F (36.7 C) 97.8 F (36.6 C) 97.8 F (36.6 C)  TempSrc:  Oral Oral Oral  SpO2: 98% 96% 97% 98%  Weight:      Height:        Intake/Output Summary (Last 24 hours) at 07/10/2024 0802 Last data filed at 07/10/2024 0008 Gross per 24 hour  Intake 620 ml  Output --  Net 620 ml      07/09/2024    5:51 PM 07/09/2024    4:55 AM 07/08/2024    4:29 AM  Last 3 Weights  Weight (lbs) 174 lb 2.6 oz 173 lb 8 oz 174 lb 6.1 oz  Weight (kg) 79 kg 78.7 kg 79.1 kg     Cardiac  Studies Echocardiogram: Normal LV size and function.  EF 60 to 65%.  No RWMA.  Normal RV.  Unable assess RAP.  Mild LA dilation.  Mild MR.  Normal AoV.  Normal RAP.  (07/07/2024) LHC-Native Cors & Graft Angio - PCI: Severe native CAD: Proximal LAD 90% (competitive flow from widely patent LIMA-D2); sequential 60% ostial, 90% mid and 40% mid-distal LCx with 90% ostial OM1, 99% OM 2 and CTO of mid-distal RCA.  Widely patent LIMA-D2, SVG-D1 and SVG to RPDA with flush occlusion of SVG-OM1.  (Not likely to be new) LVEDP 16. => DES PCI of proximal of mid LCx at OM1 with Synergy XD 2.75 mm x 12 mm DES to LCx with PTCA of OM1 with a 2.25 mm [place] balloon reducing lesion to 30% with small, nonflow limiting dissection noted OM1) => plan DAPT with ASA-prasugrel  x 12 months with plan to consider switching to clopidogrel  to minimize bleeding risk in the outpatient setting. Diagnostic: Dominance: Right       Intervention    Telemetry/ECG  Sinus rhythm- Personally Reviewed  Physical Exam  GEN: No acute distress.   Neck: No JVD Cardiac: RRR, no murmurs, rubs, or gallops.  Respiratory: Clear to auscultation bilaterally. GI: Soft, nontender, non-distended  MS: No edema  For questions or updates, please contact [facility] Please consult [email or web address] for contact info under         Signed, Alm Clay, MD

## 2024-07-10 NOTE — Care Management Important Message (Addendum)
 Important Message  Patient Details  Name: ADRYAN SHIN MRN: [id number] Date of Birth: [birth year]   Important Message Given:  Yes - Medicare IM  Patient left prior to IM delivery will mail to the patient home address.    Florean Hoobler 07/10/2024, 11:19 AM

## 2024-07-10 NOTE — Telephone Encounter (Signed)
   Transition of Care Follow-up Phone Call Request    Patient Name: Derek Moore Date of Birth: 12-28-1955 Date of Encounter: 07/10/2024  Primary Care Provider:  Thurmond Cathlyn LABOR., MD Primary Cardiologist:  Kardie Tobb, DO  Derek Moore has been scheduled for a transition of care follow up appointment with a [facility] provider:  Delon Hoover, NP  Please reach out to Derek CHRISTELLA Baetz within 48 hours of discharge to confirm appointment and review transition of care protocol questionnaire. Anticipated discharge date: 8/12  Manuelita Rummer, NP  07/10/2024, 8:27 AM

## 2024-07-11 ENCOUNTER — Telehealth: Payer: Self-pay | Admitting: Gastroenterology

## 2024-07-11 LAB — HEMOGLOBIN A1C
Hgb A1c MFr Bld: 6.3 % — ABNORMAL HIGH (ref 4.8–5.6)
Mean Plasma Glucose: 134 mg/dL

## 2024-07-11 LAB — LIPOPROTEIN A (LPA): Lipoprotein (a): 44.6 nmol/L — ABNORMAL HIGH (ref <75.0)

## 2024-07-11 NOTE — Telephone Encounter (Signed)
 Called patient and cancelled his procedure and scheduled patient for a OV for October 22 nd at 8:30 AM.

## 2024-07-11 NOTE — Telephone Encounter (Signed)
 error

## 2024-07-11 NOTE — Telephone Encounter (Signed)
 Patient called and stated that he has had a stent placed by his cardiologist and is now on blood thinner medication called zetimibe 10 MG and Prasugrel  10 MG. Patient is wanting to know if his procedure for [address] of [address] needs to be postpone or if he can still come in to have it. Patient is requesting a call back. Please advise.

## 2024-07-11 NOTE — Telephone Encounter (Signed)
 Left voice message to return call. 1st TOC attempt  Patient contacted regarding discharge from [facility] on 07/10/24.  Patient understands to follow up with provider Delon Hoover, NP on 07/18/2024 at 10:55 at [place].

## 2024-07-11 NOTE — Telephone Encounter (Signed)
 Zetimibe is cholesterol medication. Effient  will need to be held prior to endoscopic procedures. Will contact cardiology to see when patient can come off of Effient . See alternate TE.

## 2024-07-12 NOTE — Telephone Encounter (Signed)
 Patient contacted regarding discharge from [facility] on 07/10/24.   Patient understands to follow up with provider Delon Hoover, NP on 07/18/2024 at 10:55 at [facility].  Patient understands discharge instructions? yes Patient understands medications and regiment? yes Patient understands to bring all medications to this visit? yes  Ask patient:  Are you enrolled in My Chart YES

## 2024-07-13 ENCOUNTER — Telehealth (HOSPITAL_COMMUNITY): Payer: Self-pay

## 2024-07-13 NOTE — Telephone Encounter (Signed)
 Faxed outside referral for Phase II Cardiac Rehab to [facility].

## 2024-07-17 ENCOUNTER — Other Ambulatory Visit: Payer: Self-pay

## 2024-07-17 DIAGNOSIS — I25118 Atherosclerotic heart disease of native coronary artery with other forms of angina pectoris: Secondary | ICD-10-CM | POA: Insufficient documentation

## 2024-07-17 LAB — GENECONNECT MOLECULAR SCREEN: Genetic Analysis Overall Interpretation: NEGATIVE

## 2024-07-17 LAB — HELIX PHARMACOGENOMICS (PGX) CLOPIDOGREL TEST

## 2024-07-17 NOTE — Progress Notes (Signed)
 " Cardiology Office Note:    Date:  07/19/2024   ID:  Derek Moore, Derek Moore 1956-08-22, MRN [id number]  PCP:  Thurmond Cathlyn LABOR., MD   [facility] Providers Cardiologist:  Derek Leiter, MD Cardiology APP:  Carlin Delon BROCKS, NP  Electrophysiologist:  OLE ONEIDA HOLTS, MD     Referring MD: Thurmond Cathlyn LABOR., MD     History of Present Illness:    Derek Moore is a 68 y.o. male with a hx of CAD s/p CABG x 4 2021, PAF, presence of Watchman device 11/12/2021, thrombus of left atrial appendage, asthma, GERD, DM 2, hypothyroidism, ocular myasthenia gravis, depression, chronic gout, hyperlipidemia, mild carotid artery stenosis, palpitations.  07/09/2024 left heart cath s/p DES to mid circumflex lesion and angioplasty to side branch  07/07/2024 echo EF 60-65%, mild MR 04/12/2023 monitor average HR 66 bpm, 3 episodes of SVT 11/12/2021 LAAO with Watchman device 10/27/2020 CABG x 4 10/08/2020 cardiac cath severe 3 vessel CAD >> referred to [facility] 09/2020 cCTA showed moderate to severe multivessel CAD with probable distal RCA occlusion, L> R collaterals.  FFR demonstrated significant multivessel CAD, including an occluded distal RCA, mid LAD stenosis and significant proximal to mid LCx stenosis.  He had a coronary CTA in November 2021 which showed moderate to severe multivessel CAD with probable distal RCA occlusion, L> R collaterals.  FFR demonstrated significant multivessel CAD, including an occluded distal RCA, mid LAD stenosis and significant proximal to mid LCx stenosis.  LHC on 10/08/2020 revealed severe three-vessel disease, EF 45%.  Referred to the [facility].  Workup for CABG included carotid ultrasounds which revealed mild bilateral stenosis.  He was admitted on 10/27/2020 to 10/31/2020 and underwent CABG x 4 with Dr. Lucas, LIMA to second diagonal branch of LAD; SVG to diagonal 1; SVG to OM1; SVG to PDA; with endoscopic vein harvest from the right leg.  He developed postoperative atrial  fibrillation, started on IV amiodarone , quickly reverted back to normal sinus rhythm.  He wore a monitor in January 2022 which showed atrial fibrillation occurring less than 1% of the time.  He was started on amiodarone , and DOAC.  He continued to have IDA, significant bruising while on DOAC, he establish care with Dr. Holts for options to avoid long-term DOAC.  On 11/12/2021 Watchman device was implanted, his Eliquis  was disconcerted continued and he was started on Plavix  75 mg daily x 6 months.  Most recently evaluated by Dr. Leiter 07/2023, bothered by dizziness and lightheadedness and his SGLT2i was stopped, he was advised to follow up in 1 year.   He was admitted to [facility] on 07/06/24 for chest pain, troponins were negative, but symptoms were consistent with unstable angina. He underwent LHC with successful bifurcation of p/mLCX and OM1 with DES to mLCX and recommendations for DAPT x 12 months.   He presents today for follow-up after his most recent PCI.  He is doing well, he offers no formal complaints.  He is tolerating his medications without any adverse side effects, he does have some superficial bruising but nothing substantial.  His wife is a engineer, civil (consulting) and is well versed  in his care, and he mentions he would like to get on a PCSK9 inhibitor for management of his lipids. He denies chest pain, palpitations, dyspnea, pnd, orthopnea, n, v, dizziness, syncope, edema, weight gain, or early satiety.    Past Medical History:  Diagnosis Date   Abnormal cardiac CT angiography    Abnormal EKG 03/09/2018  Acquired hypothyroidism 03/07/2018   Acute coronary syndrome (HCC) 07/06/2024   Allergy    ANA positive 10/02/2020   Formatting of this note might be different from the original.  Homogenous. Other tests negartive. Probably false.  See note 10/02/2020   Aortic atherosclerosis (HCC) 07/17/2020   Formatting of this note might be different from the original. Seen on CT [facility] 06/2019    Arthritis    Asthma    CAD (coronary artery disease), native coronary artery    Chronic gout without tophus 03/07/2018   Coronary artery disease involving native coronary artery of native heart without angina pectoris 07/17/2020   Formatting of this note might be different from the original.  Seen on [facility] 06/2019     Formatting of this note might be different from the original.  Status post four-vessel bypass     Formatting of this note might be different from the original.  Status post four-vessel bypass 09/2020   Coronary artery disease involving native coronary artery with unstable angina pectoris (HCC)    Coronary artery disease of native artery of native heart with stable angina pectoris (HCC)    Cough syncope 1999   Dental caries    Depression 03/07/2018   Diplopia 10/11/2018   DISH (diffuse idiopathic skeletal hyperostosis) 07/17/2020   Formatting of this note might be different from the original. Seen on [facility] 06/2019   Dizzy 05/09/2018   Gastroesophageal reflux disease without esophagitis 03/07/2018   Formatting of this note might be different from the original. Long term   GERD (gastroesophageal reflux disease) 03/07/2018   Globus sensation 09/22/2022   Gout 03/07/2018   Hay fever    Heel spur, left 02/07/2020   History of asthma 11/06/2020   History of iron deficiency anemia 02/13/2022   History of smokeless tobacco use 07/17/2020   Formatting of this note might be different from the original.  Discussed chantix.  He wants to use. Did well in past. Off label, he understands.     Last Assessment & Plan:   Formatting of this note might be different from the original.  Patient has been without smokeless tobacco for 1 week now   Hyperlipidemia 03/07/2018   Hyperlipidemia associated with type 2 diabetes mellitus (HCC) 03/07/2018   Hypomagnesemia 01/29/2021   Hypothyroidism (acquired) 03/07/2018   Formatting of this note might be different from the original. 1990s.   Mild  cognitive impairment 07/01/2022   Mild episode of recurrent major depressive disorder (HCC) 07/17/2020   Myalgia due to statin 08/05/2021   Myasthenia gravis (HCC) 07/06/2024   Obesity    Obesity (BMI 30-39.9) 12/05/2020   Ocular myasthenia gravis (HCC) 07/17/2020   Formatting of this note might be different from the original. Ocular.  Follows at [facility].  Sees neurology and eye doctor.   Orthostatic hypotension 12/19/2020   PAF (paroxysmal atrial fibrillation) (HCC) 11/12/2021   Palpitation 03/08/2018   Plantar fasciitis 02/07/2020   Presence of Watchman left atrial appendage closure device 11/12/2021   with 24mm Watchman FLX woth Dr. Cindie   Primary osteoarthritis involving multiple joints 07/17/2020   Recurrent sinusitis    S/P CABG x 4 10/27/2020   Smokeless tobacco use 07/17/2020   Formatting of this note might be different from the original. Discussed chantix.  He wants to use. Did well in past. Off label, he understands.   Subcutaneous nodules 01/27/2021   Thrombus of left atrial appendage    Tightness of heel cord, left 02/07/2020   Type  2 diabetes mellitus (HCC) 03/07/2018   Type 2 diabetes mellitus with hyperlipidemia (HCC) 03/07/2018   Type 2 diabetes mellitus without complication, without long-term current use of insulin  (HCC) 03/07/2018   Formatting of this note might be different from the original. 2014.   Vitamin B12 deficiency 09/25/2020    Past Surgical History:  Procedure Laterality Date   CATARACT EXTRACTION W/ INTRAOCULAR LENS  IMPLANT, BILATERAL     COLONOSCOPY W/ POLYPECTOMY  11/27/2012   Colonic polyp, status post polypectomy. Mild sigmoid diverticulosis.   CORONARY ARTERY BYPASS GRAFT N/A 10/27/2020   Procedure: CORONARY ARTERY BYPASS GRAFTING (CABG), ON PUMP, TIMES FOUR, USING LEFT INTERNAL MAMMARY ARTERY AND ENDOSCOPICALLY HARVESTED RIGHT GREATER SAPHENOUS VEIN;  Surgeon: Lucas Dorise POUR, MD;  Location: [facility] OR;  Service: Open Heart Surgery;  Laterality: N/A;    CORONARY PRESSURE/FFR STUDY N/A 07/09/2024   Procedure: CORONARY PRESSURE/FFR STUDY;  Surgeon: Mady Bruckner, MD;  Location: [facility] INVASIVE CV LAB;  Service: Cardiovascular;  Laterality: N/A;   CORONARY STENT INTERVENTION N/A 07/09/2024   Procedure: CORONARY STENT INTERVENTION;  Surgeon: Mady Bruckner, MD;  Location: [facility] INVASIVE CV LAB;  Service: Cardiovascular;  Laterality: N/A;   DENTAL SURGERY     implants x 2   detatched retina     ESOPHAGOGASTRODUODENOSCOPY  04/17/2007   Small hiatal hernia. Mild gastritis. Otherwise normal EGD   kidney stone extraction     cystoscopy x 2   KNEE SURGERY Left    bone spurs   LEFT ATRIAL APPENDAGE OCCLUSION N/A 11/12/2021   Procedure: LEFT ATRIAL APPENDAGE OCCLUSION;  Surgeon: Cindie Ole DASEN, MD;  Location: [facility] INVASIVE CV LAB;  Service: Cardiovascular;  Laterality: N/A;   LEFT HEART CATH AND CORONARY ANGIOGRAPHY N/A 10/08/2020   Procedure: LEFT HEART CATH AND CORONARY ANGIOGRAPHY;  Surgeon: Anner Alm ORN, MD;  Location: [facility] INVASIVE CV LAB;  Service: Cardiovascular;  Laterality: N/A;   LEFT HEART CATH AND CORS/GRAFTS ANGIOGRAPHY N/A 07/09/2024   Procedure: LEFT HEART CATH AND CORS/GRAFTS ANGIOGRAPHY;  Surgeon: Mady Bruckner, MD;  Location: [facility] INVASIVE CV LAB;  Service: Cardiovascular;  Laterality: N/A;   NOSE SURGERY     TEE WITHOUT CARDIOVERSION N/A 10/27/2020   Procedure: TRANSESOPHAGEAL ECHOCARDIOGRAM (TEE);  Surgeon: Lucas Dorise POUR, MD;  Location: [facility] OR;  Service: Open Heart Surgery;  Laterality: N/A;   TEE WITHOUT CARDIOVERSION N/A 01/08/2021   Procedure: TRANSESOPHAGEAL ECHOCARDIOGRAM (TEE);  Surgeon: Maranda Leim DEL, MD;  Location: [facility] ENDOSCOPY;  Service: Cardiovascular;  Laterality: N/A;   TEE WITHOUT CARDIOVERSION N/A 11/12/2021   Procedure: TRANSESOPHAGEAL ECHOCARDIOGRAM (TEE);  Surgeon: Cindie Ole DASEN, MD;  Location: [facility] INVASIVE CV LAB;  Service: Cardiovascular;  Laterality: N/A;   TEE WITHOUT CARDIOVERSION N/A 12/29/2021    Procedure: TRANSESOPHAGEAL ECHOCARDIOGRAM (TEE);  Surgeon: Francyne Headland, MD;  Location: [facility] ENDOSCOPY;  Service: Cardiovascular;  Laterality: N/A;    Current Medications: Current Meds  Medication Sig   acetaminophen  (TYLENOL ) 500 MG tablet Take 500-1,000 mg by mouth every 6 (six) hours as needed (for pain.).   albuterol  (PROVENTIL ) (2.5 MG/3ML) 0.083% nebulizer solution Take 2.5 mg by nebulization every 6 (six) hours as needed for wheezing or shortness of breath.   albuterol  (VENTOLIN  HFA) 108 (90 Base) MCG/ACT inhaler Inhale into the lungs every 6 (six) hours as needed for wheezing or shortness of breath.   aspirin  EC 81 MG tablet Take 1 tablet (81 mg total) by mouth at bedtime.   Dextran 70-Hypromellose, PF, (ARTIFICIAL TEARS PF) 0.1-0.3 % SOLN Place 1 drop into  both eyes 3 (three) times daily as needed (dry/irritated eyes.).   Evolocumab  (REPATHA  SURECLICK) 140 MG/ML SOAJ Inject 140 mg into the skin every 14 (fourteen) days.   ezetimibe  (ZETIA ) 10 MG tablet Take 1 tablet (10 mg total) by mouth daily.   fludrocortisone  (FLORINEF ) 0.1 MG tablet TAKE 1 TABLET BY MOUTH DAILY.   levothyroxine  (SYNTHROID ) 137 MCG tablet Take 137 mcg by mouth daily.   Magnesium  Oxide 250 MG TABS Take 250 mg by mouth daily.   metFORMIN (GLUCOPHAGE) 1000 MG tablet Take 1,000 mg by mouth 2 (two) times daily with a meal.   midodrine  (PROAMATINE ) 5 MG tablet Take 1 tablet (5 mg total) by mouth 3 (three) times daily with meals.   mycophenolate  (CELLCEPT ) 500 MG tablet Take 1,500 mg by mouth 2 (two) times daily.   Omega-3 Fatty Acids (FISH OIL ULTRA) 1400 MG CAPS Take 1,400 mg by mouth in the morning and at bedtime. (Patient taking differently: Take 1,400 mg by mouth daily.)   pantoprazole  (PROTONIX ) 40 MG tablet Take 40 mg by mouth at bedtime.   PARoxetine  (PAXIL ) 40 MG tablet Take 40 mg by mouth daily.   prasugrel  (EFFIENT ) 10 MG TABS tablet Take 1 tablet (10 mg total) by mouth daily.   pyridostigmine  (MESTINON )  60 MG tablet Take 90 mg by mouth 4 (four) times daily. Take 1 1/2 tablet (90 mg) 4 times daily (Patient taking differently: Take 120 mg by mouth 4 (four) times daily. Take 2 tablets (120 mg) 4 times daily)   vitamin B-12 (CYANOCOBALAMIN) 1000 MCG tablet Take 1,000 mcg by mouth daily.     Allergies:   Codeine, Hydrocodone-acetaminophen , Lipitor [atorvastatin], Crestor  [rosuvastatin ], Amoxicillin , and Latex   Social History   Socioeconomic History   Marital status: Married    Spouse name: Kim   Number of children: 3   Years of education: Not on file   Highest education level: Not on file  Occupational History   Occupation: retired  Tobacco Use   Smoking status: Former    Types: Software Engineer, Pipe    Quit date: 11/30/1999    Years since quitting: 24.6   Smokeless tobacco: Current    Types: Snuff   Tobacco comments:    stopped smoking pipe tobacco 30 years ago  Vaping Use   Vaping status: Never Used  Substance and Sexual Activity   Alcohol use: Yes    Comment: Occasional Beer    Drug use: Never   Sexual activity: Not on file  Other Topics Concern   Not on file  Social History Narrative   Not on file   Social Drivers of Health   Financial Resource Strain: Low Risk  (06/24/2022)   Received from [facility]   Overall Financial Resource Strain (CARDIA)  Food Insecurity: No Food Insecurity (07/06/2024)   Hunger Vital Sign    Worried About Running Out of Food in the Last Year: Never true    Ran Out of Food in the Last Year: Never true  Transportation Needs: No Transportation Needs (07/06/2024)   PRAPARE - Administrator, Civil Service (Medical): No    Lack of Transportation (Non-Medical): No  Physical Activity: Sufficiently Active (06/24/2022)   Received from [facility], [facility] visits prior to 01/29/2023.   Exercise Vital Sign    On average, how many days per week do you engage in moderate to strenuous exercise (like a brisk walk)?: 3 days    On  average, how many minutes  do you engage in exercise at this level?: 60 min  Stress: No Stress Concern Present (06/24/2022)   Received from [facility], [facility] visits prior to 01/29/2023.   [employer] of Occupational Health - Occupational Stress Questionnaire    Feeling of Stress : Only a little  Social Connections: Moderately Isolated (07/06/2024)   Social Connection and Isolation Panel    Frequency of Communication with Friends and Family: More than three times a week    Frequency of Social Gatherings with Friends and Family: More than three times a week    Attends Religious Services: Never    Database Administrator or Organizations: No    Attends Engineer, Structural: Never    Marital Status: Married     Family History: The patient's family history includes Blindness in his father; CAD in his father; Cancer in his father; Esophageal cancer in his paternal uncle; Heart Problems in his mother; Hypertension in his father; Rheum arthritis in his mother. There is no history of Colon cancer, Rectal cancer, or Stomach cancer.  ROS:   Please see the history of present illness.     All other systems reviewed and are negative.  EKGs/Labs/Other Studies Reviewed:    The following studies were reviewed today: Cardiac Studies & Procedures   ______________________________________________________________________________________________ CARDIAC CATHETERIZATION  CARDIAC CATHETERIZATION 07/09/2024  Conclusion Conclusions: Severe native coronary artery disease, including 80% proximal LAD stenosis with competitive flow from LIMA-D2 that backfills the distal LAD, sequential 60% ostial (RFR 0.99), 90% mid, and 40% mid/distal LCx lesions, 90% OM1 and 99% OM2 stenoses, and chronic total occlusion of mid/distal RCA. Widely patent LIMA-D2, SVG-D1, and SVG-rPDA. Occluded SVG-OM1. Mildly elevated left ventricular filling pressure (LVEDP 16 mmHg). Successful  bifurcation PCI of proximal/mid LCx and OM1 using provisional technique with placement of Synergy XD 2.75 x 12 mm drug-eluting stent in LCx and angioplasty of OM1 using 2.25 mm [place] balloon.  Final angiogram demonstrates 0% residual stenosis in LCx with TIMI-3 flow; there is 30% ostial OM1 stenosis with non-flow-limiting dissection that was not stented.  Conclusions: Dual antiplatelet therapy with aspirin  and prasugrel  for up to 12 months; consider switching to clopidogrel  to minimize bleeding risk if CYP2C19 genotype is favorable. Aggressive secondary prevention of CAD; favor medical management of severe OM2 stenosis, with is not favorable for PCI.  Derek Hanson, MD [facility]  Findings Coronary Findings Diagnostic  Dominance: Right  Left Main Ost LM to Ost LAD lesion is 20% stenosed with 60% stenosed side branch in Ost Cx. The lesion is mildly calcified. Pressure gradient was performed on the lesion. RFR: 0.99.  Left Anterior Descending Vessel is small. Ost LAD to Prox LAD lesion is 30% stenosed. The lesion is segmental. The lesion is calcified. Prox LAD to Mid LAD lesion is 80% stenosed with 60% stenosed side branch in 1st Diag. The lesion is located at the bifurcation, eccentric, concentric and smooth. CTA FFR  First Diagonal Branch Vessel is small in size.  First Septal Branch Vessel is small in size.  Ramus Intermedius Vessel is small.  Left Circumflex Vessel is moderate in size. Mid Cx lesion is 90% stenosed with 90% stenosed side branch in 1st Mrg. The lesion is located at the bifurcation. Mid Cx to Dist Cx lesion is 40% stenosed with 99% stenosed side branch in 2nd Mrg. The lesion is focal and concentric.  First Obtuse Marginal Branch Vessel is small in size.  Second Obtuse Marginal Branch Vessel is small in  size.  First Left Posterolateral Branch Vessel is small in size.  Second Left Posterolateral Branch Vessel is small in size.  Left Posterior  Atrioventricular Artery Vessel is large in size.  Right Coronary Artery Prox RCA lesion is 95% stenosed. The lesion is segmental and eccentric. Prox RCA to Mid RCA lesion is 50% stenosed. The lesion is segmental and irregular. Mid RCA lesion is 70% stenosed. The lesion is focal and concentric. Mid RCA to Dist RCA lesion is 100% stenosed. The lesion is chronically occluded.  Acute Marginal Branch Vessel is small in size.  Right Ventricular Branch Vessel is small in size.  Right Posterior Atrioventricular Artery Collaterals RPAV filled by collaterals from LPAV.  First Right Posterolateral Branch Vessel is small in size.  Second Right Posterolateral Branch Vessel is moderate in size.  Saphenous Graft To RPDA SVG graft was visualized by angiography and is normal in caliber.  The graft exhibits no disease.  Saphenous Graft To 1st Mrg SVG graft was visualized by angiography. Origin to Insertion lesion is 100% stenosed.  LIMA LIMA Graft To 2nd Diag LIMA graft was visualized by angiography and is normal in caliber.  The graft exhibits no disease.  Saphenous Graft To 1st Diag SVG graft was visualized by angiography and is normal in caliber.  The graft exhibits no disease.  Intervention  Mid Cx lesion with side branch in 1st Mrg Stent - Main Branch A drug-eluting stent was successfully placed using a STENT SYNERGY XD 2.75X12 in the main branch. Angioplasty - Side Branch Angioplasty was performed in the side branch. Balloon angioplasty was performed using a BALLOON [place] EMERGE MR 2.25X12. Post-Intervention Lesion Assessment The intervention was successful. Pre-interventional TIMI flow is 3. Post-intervention TIMI flow is 3. At this lesion, a dissection occurred. There is a 0% residual stenosis in the main branch post intervention. There is a 30% residual stenosis in the side branch post intervention.   CARDIAC CATHETERIZATION  CARDIAC CATHETERIZATION  10/08/2020  Conclusion  There is mild left ventricular systolic dysfunction.  LV end diastolic pressure is normal.  There is no aortic valve stenosis.  Prox RCA lesion is 95% stenosed. Prox RCA to Mid RCA lesion is 50% stenosed. Mid RCA lesion is 70% stenosed. Mid RCA to Dist RCA lesion is 100% stenosed. (Fills via left-right collaterals from LCx)  Ost LM to Ost LAD lesion is 20% stenosed with 30% stenosed side branch in Ost Cx.  Ost LAD to Prox LAD lesion is 30% stenosed.  Mid Cx lesion is 90% stenosed with 90% stenosed side branch in 1st Mrg.  Mid Cx to Dist Cx lesion is 70% stenosed with 95% stenosed side branch in 2nd Mrg.  Prox LAD to Mid LAD lesion is 70% stenosed with 60% stenosed side branch in 1st Diag.  SUMMARY  Severe three-vessel disease:  Extensive proximal to mid 90 having 70 and 80% stenosis followed by 100% CTO (fills via left to right collaterals from AVG LCx);  LEFT MAIN diffuse 10 to 20% stenosis, 20% ostial LCx with 90% focal concentric stenosis at OM1 (1st Mrg) followed by a second 70% stenosis at a very small caliber OM 2,  Segmental calcified 40% proximal LAD with DFR positive long segment of 60-65% at the takeoff of D1 (DFR was 0.85, CT FFR was 0.79).  LAD after D1 (1st Diag) bifurcates more distally into tandem vessels of D2 and basely the septal LAD-is relatively small caliber  Mildly reduced LVEF with mild global hypokinesis EF roughly 45% by visual  estimate.  Low LVEDP   RECOMMENDATION  He is relatively asymptomatic, recommend outpatient CVTS consultation -> need to consider the benefits of full revascularization with CABG versus PCI of the bifurcation LCx-OM1 lesion and bifurcation LAD-D1 lesion.  No antiplatelet agent besides aspirin  with planned CVTS consultation  Continue aggressive GUIDELINE DIRECTED MEDICAL THERAPY  Follow-up with Dr. Monetta   That way Alm Clay, MD  Findings Coronary Findings Diagnostic  Dominance:  Right  Left Main Ost LM to Ost LAD lesion is 20% stenosed with 30% stenosed side branch in Ost Cx. The lesion is mildly calcified.  Left Anterior Descending Vessel is small. Ost LAD to Prox LAD lesion is 30% stenosed. The lesion is segmental. The lesion is calcified. Prox LAD to Mid LAD lesion is 70% stenosed with 60% stenosed side branch in 1st Diag. The lesion is located at the bifurcation, eccentric, concentric and smooth. CTA FFR  First Diagonal Branch Vessel is small in size.  First Septal Branch Vessel is small in size.  Ramus Intermedius Vessel is small.  Left Circumflex Vessel is moderate in size. Mid Cx lesion is 90% stenosed with 90% stenosed side branch in 1st Mrg. The lesion is located at the bifurcation. Mid Cx to Dist Cx lesion is 70% stenosed with 95% stenosed side branch in 2nd Mrg. The lesion is focal and concentric.  First Obtuse Marginal Branch Vessel is small in size.  Second Obtuse Marginal Branch Vessel is small in size.  First Left Posterolateral Branch Vessel is small in size.  Second Left Posterolateral Branch Vessel is small in size.  Left Posterior Atrioventricular Artery Vessel is large in size.  Right Coronary Artery Prox RCA lesion is 95% stenosed. The lesion is segmental and eccentric. Prox RCA to Mid RCA lesion is 50% stenosed. The lesion is segmental and irregular. Mid RCA lesion is 70% stenosed. The lesion is focal and concentric. Mid RCA to Dist RCA lesion is 100% stenosed. The lesion is chronically occluded.  Acute Marginal Branch Vessel is small in size.  Right Ventricular Branch Vessel is small in size.  Right Posterior Atrioventricular Artery Collaterals RPAV filled by collaterals from LPAV.  First Right Posterolateral Branch Vessel is small in size.  Second Right Posterolateral Branch Vessel is moderate in size.  Intervention  No interventions have been documented.   STRESS TESTS  ECHOCARDIOGRAM STRESS TEST  05/01/2018  Narrative *[facility]* [address] [place], [place] [address] [phone]  ------------------------------------------------------------------- Stress Echocardiography  Patient:    Deontrey, Massi MR #:       [id number] Study Date: 05/01/2018 Gender:     M Age:        42 Height:     167.6 cm Weight:     101.7 kg BSA:        2.22 m^2 Pt. Status: Room:  ATTENDING    Derek Monetta, MD ORDERING     Derek Monetta, MD REFERRING    Derek Monetta, MD PERFORMING   [facility] SONOGRAPHER  Jimmy Reel, RDCS  cc:  -------------------------------------------------------------------  ------------------------------------------------------------------- Indications:      Abnormal EKG 794.31.  Palpitations 785.1.  ------------------------------------------------------------------- History:   Risk factors:  Diabetes mellitus. Dyslipidemia.  ------------------------------------------------------------------- Study Conclusions  - Stress ECG conclusions: There were no stress arrhythmias or conduction abnormalities. The stress ECG was negative for ischemia. - Staged echo: There was no echocardiographic evidence for stress-induced ischemia.  Impressions:  - 1. Stress echo negative for evidence of inducible ischemia. 2.  Normal left ventricular systolic function at rest and good augmentation of the left ventricular segments at stress 3. Normal exercise capacity.  ------------------------------------------------------------------- Study data:   Study status:  Routine.  Consent:  The risks, benefits, and alternatives to the procedure were explained to the patient and informed consent was obtained.  Procedure:  The patient reported no pain pre or post test. Initial setup. The patient was brought to the laboratory. A baseline ECG was recorded. Surface ECG leads and automatic cuff blood pressure measurements were monitored. Treadmill exercise testing  was performed using the Bruce protocol. The patient exercised for 8 min 1 sec, to protocol stage 3, to a maximal work rate of 10.1 mets. Exercise was terminated due to moderate fatigue. The patient was positioned for image acquisition and recovery monitoring. Transthoracic stress echocardiography. Images were captured at baseline and peak exercise.  Study completion:  The patient tolerated the procedure well. There were no complications.          Bruce protocol. Stress echocardiography.  Birthdate:  Patient birthdate: 1956/03/21.  Age: Patient is 68 yr old.  Sex:  Gender: male.    BMI: 36.2 kg/m^2. Blood pressure:     100/66  Patient status:  Outpatient.  Study date:  Study date: 05/01/2018. Study time: 10:17 AM.  -------------------------------------------------------------------  ------------------------------------------------------------------- Baseline ECG:  Normal.  ------------------------------------------------------------------- Stress protocol:  +--------+---------------+---+------------+--------+ !Stage   !Time into phase!HR !BP (mmHg)   !Symptoms! +--------+---------------+---+------------+--------+ !Baseline!---------------!68 !116/77 (90) !None    ! +--------+---------------+---+------------+--------+ !Stage 1 !---------------!88 !153/72 (99) !--------! +--------+---------------+---+------------+--------+ !Stage 2 !---------------!100!177/71 (106)!--------! +--------+---------------+---+------------+--------+ !Stage 3 !---------------!125!------------!--------! +--------+---------------+---+------------+--------+ !Recovery!3 min 20 sec   !66 !167/65 (99) !--------! +--------+---------------+---+------------+--------+  ------------------------------------------------------------------- Stress results:   Maximal heart rate during stress was 125 bpm (80% of maximal predicted heart rate). The maximal predicted heart rate was 157 bpm.The target heart rate was achieved.  The heart rate response to stress was normal. There was a normal resting blood pressure with an appropriate response to stress.  The patient experienced no chest pain during stress.  ------------------------------------------------------------------- Stress ECG:  There were no stress arrhythmias or conduction abnormalities.  The stress ECG was negative for ischemia.  ------------------------------------------------------------------- Baseline:  - The estimated LV ejection fraction was 60%. - Normal wall motion; no LV regional wall motion abnormalities.  Peak stress:  - The estimated LV ejection fraction was 70%. - Normal wall motion; no LV regional wall motion abnormalities.  ------------------------------------------------------------------- Stress echo results:     Left ventricular ejection fraction was normal at rest and with stress. There was no echocardiographic evidence for stress-induced ischemia.  ------------------------------------------------------------------- Measurements  Left ventricle                           Value        Reference LV ID, ED, PLAX chordal                  48    mm     43 - 52 LV ID, ES, PLAX chordal        (H)       39.8  mm     23 - 38 LV fx shortening, PLAX chordal (L)       17    %      >=29 LV PW thickness, ED                      12.7  mm     ---------  IVS/LV PW ratio, ED                      0.96         <=1.3  Ventricular septum                       Value        Reference IVS thickness, ED                        12.2  mm     ---------  LVOT                                     Value        Reference LVOT ID, S                               22    mm     --------- LVOT area                                3.8   cm^2   ---------  Aorta                                    Value        Reference Aortic root ID, ED                       34    mm     ---------  Left atrium                              Value        Reference LA ID, A-P, ES                            39    mm     --------- LA ID/bsa, A-P                           1.76  cm/m^2 <=2.2  Legend: (L)  and  (H)  mark values outside specified reference range.  ------------------------------------------------------------------- Prepared and Electronically Authenticated by  Derek Liberati Crape, MD 2019-06-03T14:10:12   ECHOCARDIOGRAM  ECHOCARDIOGRAM COMPLETE 07/07/2024  Narrative ECHOCARDIOGRAM REPORT    Patient Name:   Derek Moore Date of Exam: 07/07/2024 Medical Rec #:  [id number]         Height:       66.0 in Accession #:    [id number]        Weight:       175.3 lb Date of Birth:  20-Apr-1956        BSA:          1.891 m Patient Age:    67 years          BP:           118/68 mmHg Patient Gender: M                 HR:  50 bpm. Exam Location:  Inpatient  Procedure: 2D Echo (Both Spectral and Color Flow Doppler were utilized during procedure).  Indications:    chest pain  History:        Patient has prior history of Echocardiogram examinations, most recent 12/29/2021. Prior CABG; Risk Factors:Diabetes, Dyslipidemia and Former Smoker.  Sonographer:    Tinnie Barefoot RDCS Referring Phys: [id number] Derek Moore  IMPRESSIONS   1. Left ventricular ejection fraction, by estimation, is 60 to 65%. The left ventricle has normal function. The left ventricle has no regional wall motion abnormalities. Left ventricular diastolic parameters were normal. 2. Right ventricular systolic function is normal. The right ventricular size is normal. Tricuspid regurgitation signal is inadequate for assessing PA pressure. 3. Left atrial size was mildly dilated. 4. The mitral valve is abnormal. Mild mitral valve regurgitation. No evidence of mitral stenosis. 5. The aortic valve is tricuspid. Aortic valve regurgitation is not visualized. No aortic stenosis is present. 6. The inferior vena cava is normal in size with greater than 50% respiratory variability,  suggesting right atrial pressure of 3 mmHg.  FINDINGS Left Ventricle: Left ventricular ejection fraction, by estimation, is 60 to 65%. The left ventricle has normal function. The left ventricle has no regional wall motion abnormalities. The left ventricular internal cavity size was normal in size. There is no left ventricular hypertrophy. Left ventricular diastolic parameters were normal.  Right Ventricle: The right ventricular size is normal. Right vetricular wall thickness was not well visualized. Right ventricular systolic function is normal. Tricuspid regurgitation signal is inadequate for assessing PA pressure.  Left Atrium: Left atrial size was mildly dilated.  Right Atrium: Right atrial size was normal in size.  Pericardium: There is no evidence of pericardial effusion.  Mitral Valve: The mitral valve is abnormal. Mild mitral valve regurgitation. No evidence of mitral valve stenosis.  Tricuspid Valve: The tricuspid valve is normal in structure. Tricuspid valve regurgitation is trivial. No evidence of tricuspid stenosis.  Aortic Valve: The aortic valve is tricuspid. Aortic valve regurgitation is not visualized. No aortic stenosis is present. Aortic valve mean gradient measures 5.5 mmHg. Aortic valve peak gradient measures 12.1 mmHg. Aortic valve area, by VTI measures 1.81 cm.  Pulmonic Valve: The pulmonic valve was not well visualized. Pulmonic valve regurgitation is not visualized. No evidence of pulmonic stenosis.  Aorta: The aortic root is normal in size and structure.  Venous: The inferior vena cava is normal in size with greater than 50% respiratory variability, suggesting right atrial pressure of 3 mmHg.  IAS/Shunts: No atrial level shunt detected by color flow Doppler.   LEFT VENTRICLE PLAX 2D LVIDd:         5.70 cm LVIDs:         3.80 cm LV PW:         1.00 cm LV IVS:        1.00 cm LVOT diam:     2.10 cm LV SV:         77 LV SV Index:   41 LVOT Area:     3.46  cm   RIGHT VENTRICLE             IVC RV Basal diam:  2.90 cm     IVC diam: 2.20 cm RV S prime:     11.60 cm/s TAPSE (M-mode): 1.8 cm  LEFT ATRIUM             Index        RIGHT ATRIUM  Index LA diam:        4.40 cm 2.33 cm/m   RA Area:     16.10 cm LA Vol (A2C):   73.8 ml 39.03 ml/m  RA Volume:   43.80 ml  23.17 ml/m LA Vol (A4C):   70.2 ml 37.13 ml/m LA Biplane Vol: 75.5 ml 39.93 ml/m AORTIC VALVE AV Area (Vmax):    1.85 cm AV Area (Vmean):   1.98 cm AV Area (VTI):     1.81 cm AV Vmax:           173.74 cm/s AV Vmean:          106.604 cm/s AV VTI:            0.428 m AV Peak Grad:      12.1 mmHg AV Mean Grad:      5.5 mmHg LVOT Vmax:         92.70 cm/s LVOT Vmean:        60.900 cm/s LVOT VTI:          0.223 m LVOT/AV VTI ratio: 0.52  AORTA Ao Root diam: 3.10 cm Ao Asc diam:  3.10 cm  MITRAL VALVE MV Area (PHT): 2.87 cm     SHUNTS MV Decel Time: 264 msec     Systemic VTI:  0.22 m MR Peak grad: 93.3 mmHg     Systemic Diam: 2.10 cm MR Mean grad: 66.0 mmHg MR Vmax:      483.00 cm/s MR Vmean:     391.0 cm/s MV E velocity: 115.00 cm/s MV A velocity: 78.50 cm/s MV E/A ratio:  1.46  Derek Ross MD Electronically signed by Derek Ross MD Signature Date/Time: 07/07/2024/6:08:51 PM    Final   TEE  ECHO TEE 12/29/2021  Narrative TRANSESOPHOGEAL ECHO REPORT    Patient Name:   Derek Moore Date of Exam: 12/29/2021 Medical Rec #:  [id number]         Height:       66.0 in Accession #:    [id number]        Weight:       190.0 lb Date of Birth:  November 25, 1956        BSA:          1.957 m Patient Age:    65 years          BP:           102/66 mmHg Patient Gender: M                 HR:           57 bpm. Exam Location:  Inpatient  Procedure: 2D Echo, Color Doppler and Cardiac Doppler  Indications:     Watchman Follow up  History:         Patient has prior history of Echocardiogram examinations, most recent 11/12/2021. Prior CABG,  Arrythmias:Atrial Fibrillation; Risk Factors:Diabetes and Dyslipidemia. 24mm Watchman Device implanted 11/12/21.  Sonographer:     Damien Senior RDCS Referring Phys:  [id number] OLE ONEIDA HOLTS Diagnosing Phys: Derek Balding MD  PROCEDURE: After discussion of the risks and benefits of a TEE, an informed consent was obtained from the patient. The transesophogeal probe was passed without difficulty through the esophogus of the patient. Local oropharyngeal anesthetic was provided with Cetacaine . Sedation performed by different physician. The patient was monitored while under deep sedation. Anesthestetic sedation was provided intravenously by Anesthesiology: 146mg  of Propofol , 60mg  of Lidocaine . The patient's vital signs; including heart rate,  blood pressure, and oxygen saturation; remained stable throughout the procedure. The patient developed no complications during the procedure.  IMPRESSIONS   1. Left ventricular ejection fraction, by estimation, is 60 to 65%. The left ventricle has normal function. The left ventricle has no regional wall motion abnormalities. 2. Right ventricular systolic function is normal. The right ventricular size is normal. 3. Well seated Watchman device with appropriate compression and no leak. There is a tiny iatrogenic ASD with left to right shunt. No left atrial/left atrial appendage thrombus was detected. 4. The mitral valve is normal in structure. No evidence of mitral valve regurgitation. No evidence of mitral stenosis. 5. The aortic valve is normal in structure. Aortic valve regurgitation is not visualized. No aortic stenosis is present. 6. There is Moderate (Grade III) protruding plaque involving the descending aorta. 7. The inferior vena cava is normal in size with greater than 50% respiratory variability, suggesting right atrial pressure of 3 mmHg. 8. Evidence of atrial level shunting detected by color flow Doppler.  Conclusion(s)/Recommendation(s): Normal  biventricular function without evidence of hemodynamically significant valvular heart disease.  FINDINGS Left Ventricle: Left ventricular ejection fraction, by estimation, is 60 to 65%. The left ventricle has normal function. The left ventricle has no regional wall motion abnormalities. The left ventricular internal cavity size was normal in size. There is no left ventricular hypertrophy.  Right Ventricle: The right ventricular size is normal. No increase in right ventricular wall thickness. Right ventricular systolic function is normal.  Left Atrium: Well seated Watchman device with appropriate compression and no leak. There is a tiny iatrogenic ASD with left to right shunt. Left atrial size was normal in size. No left atrial/left atrial appendage thrombus was detected.  Right Atrium: Right atrial size was normal in size.  Pericardium: There is no evidence of pericardial effusion.  Mitral Valve: The mitral valve is normal in structure. No evidence of mitral valve regurgitation. No evidence of mitral valve stenosis.  Tricuspid Valve: The tricuspid valve is normal in structure. Tricuspid valve regurgitation is not demonstrated. No evidence of tricuspid stenosis.  Aortic Valve: The aortic valve is normal in structure. Aortic valve regurgitation is not visualized. No aortic stenosis is present.  Pulmonic Valve: The pulmonic valve was normal in structure. Pulmonic valve regurgitation is not visualized. No evidence of pulmonic stenosis.  Aorta: The aortic root is normal in size and structure. There is moderate (Grade III) protruding plaque involving the descending aorta.  Venous: The inferior vena cava is normal in size with greater than 50% respiratory variability, suggesting right atrial pressure of 3 mmHg.  IAS/Shunts: Evidence of atrial level shunting detected by color flow Doppler.  Derek Croitoru MD Electronically signed by Derek Balding MD Signature Date/Time: 12/29/2021/9:22:23  AM    Final  MONITORS  LONG TERM MONITOR (3-14 DAYS) 04/28/2023  Narrative Patch Wear Time:  8 days and 2 hours (2024-05-14T09:59:47-0400 to 2024-05-22T12:39:47-0400)  Patient had a min HR of 47 bpm, max HR of 140 bpm, and avg HR of 66 bpm. Predominant underlying rhythm was Sinus Rhythm. Bundle Branch Block/IVCD was present.  There were 10 triggered and 7 diary events.  All were sinus rhythm may be associated with PVCs and frequent PVCs.  There were no pauses of 3 seconds or greater no episodes of second or third-degree AV nodal block  There were no episodes of atrial fibrillation or flutter.  3 Supraventricular Tachycardia runs occurred, the run with the fastest interval lasting 4 beats with a max rate of 140  bpm, the longest lasting 9 beats with an avg rate of 125 bpm.  Isolated SVEs were rare (<1.0%), SVE Couplets were rare (<1.0%), and SVE Triplets were rare (<1.0%).  Isolated VEs were rare (<1.0%), and no VE Couplets or VE Triplets were present. Ventricular Trigeminy was present.   CT SCANS  CT CORONARY MORPH W/CTA COR W/SCORE 09/29/2020  Addendum 09/29/2020 10:12 PM ADDENDUM REPORT: 09/29/2020 22:09  HISTORY: 68 yo male with ECG abnormal, 68yr CHD risk 10-20%, not treadmill candidate Chest pain/anginal equiv  EXAM: Cardiac/Coronary CTA  TECHNIQUE: The patient was scanned on a [employer].  PROTOCOL: A 120 kV prospective scan was triggered in the descending thoracic aorta at 111 HU's. Axial non-contrast 3 mm slices were carried out through the heart. The data set was analyzed on a dedicated work station and scored using the Agatson method. Gantry rotation speed was 250 msecs and collimation was .6 mm. Beta blockade and 0.8 mg of sl NTG was given. The 3D data set was reconstructed in 5% intervals of the 67-82 % of the R-R cycle. Diastolic phases were analyzed on a dedicated work station using MPR, MIP and VRT modes. The patient received 80mL OMNIPAQUE   IOHEXOL  350 MG/ML SOLN of contrast.  FINDINGS: Quality: Very good, HR 49  Coronary calcium  score: The patient's coronary artery calcium  score is 739, which places the patient in the 88th percentile.  Coronary arteries: Normal coronary origins.  Right dominance.  Right Coronary Artery: Dominant with diffuse calcification. Moderate to severe proximal mixed plaque 50-69% stenosis (CADRADS3). Mild mixed 24-49% mid-vessel stenosis (CADRADS2). Distal RCA appears 100% occluded (CADRADS5). There appears to be left to right collaterals from the LCx to R-PDA.  Left Main Coronary Artery: Minimal ostial non-calcified stenosis 1-24% (CADRADS1). Bifurcates into the LAD and LCx arteries.  Left Anterior Descending Coronary Artery: Anterior vessel which is calcified. Ostial moderate to severe mixed 50-69% stenosis (CADRADS3). Mid vessel moderate to severe mixed stenosis (50-69%), just prior to a small D1 (<2.0 mm branch) bifurcation. The distal LAD bifurcates in a pitchfork fashion without significant disease.  Left Circumflex Artery: AV groove vessel. Minimal 1-24% non-calcified ostial stenosis (CADRADS1). Mild 25-49% mixed plaque which appears positively remodeled (CADRADS2V). Moderate sized proximal OM1 and smaller OM2 without disease.  Aorta: Normal size, 31 mm at the mid ascending aorta (level of the PA bifurcation) measured double oblique. No calcifications. No dissection.  Aortic Valve: Trileaflet.  No calcifications.  Other findings:  Normal pulmonary vein drainage into the left atrium.  Normal left atrial appendage without a thrombus.  Normal size of the pulmonary artery.  IMPRESSION: 1. Moderate to severe multivessel CAD with probable distal RCA occlusion with left to right collaterals, CADRADS = 5. CT FFR will be performed and reported separately.  2. Coronary calcium  score of 739. This was 88th percentile for age and sex matched control.  3. Normal coronary origin with  right dominance.  4. Definitive cardiac catheterization is recommended.   Electronically Signed By: Vinie JAYSON Maxcy M.D. On: 09/29/2020 22:09  Narrative EXAM: OVER-READ INTERPRETATION  CT CHEST  The following report is an over-read performed by radiologist Dr. Toribio Aye of [facility] on 09/29/2020. This over-read does not include interpretation of cardiac or coronary anatomy or pathology. The coronary calcium  score/coronary CTA interpretation by the cardiologist is attached.  COMPARISON:  None.  FINDINGS: Within the visualized portions of the thorax there are no suspicious appearing pulmonary nodules or masses, there is no acute consolidative airspace disease, no pleural  effusions, no pneumothorax and no lymphadenopathy. Visualized portions of the upper abdomen are unremarkable. There are no aggressive appearing lytic or blastic lesions noted in the visualized portions of the skeleton.  IMPRESSION: 1. No significant incidental noncardiac findings are noted.  Electronically Signed: By: Toribio Aye M.D. On: 09/29/2020 09:08     ______________________________________________________________________________________________       EKG:  EKG is  ordered today.  The ekg ordered today demonstrates NSR, HR 65 bpm, abnormal EKG but consistent with prior tracings.   Recent Labs: 07/07/2024: ALT 87 07/10/2024: BUN 12; Creatinine, Ser 0.93; Hemoglobin 12.7; Platelets 163; Potassium 3.5; Sodium 140  Recent Lipid Panel    Component Value Date/Time   CHOL 150 07/08/2024 0302   CHOL 136 06/17/2021 0850   TRIG 71 07/08/2024 0302   HDL 49 07/08/2024 0302   HDL 50 06/17/2021 0850   CHOLHDL 3.1 07/08/2024 0302   VLDL 14 07/08/2024 0302   LDLCALC 87 07/08/2024 0302   LDLCALC 67 06/17/2021 0850     Risk Assessment/Calculations:    CHA2DS2-VASc Score =     This indicates a  % annual risk of stroke. The patient's score is based upon:                  Physical Exam:    VS:  BP (!) 110/50   Pulse 63   Ht 5' 6 (1.676 m)   Wt 175 lb (79.4 kg)   SpO2 99%   BMI 28.25 kg/m     Wt Readings from Last 3 Encounters:  07/18/24 175 lb (79.4 kg)  07/09/24 174 lb 2.6 oz (79 kg)  05/17/24 167 lb (75.8 kg)     GEN:  Well nourished, well developed in no acute distress HEENT: Normal NECK: No JVD; No carotid bruits LYMPHATICS: No lymphadenopathy CARDIAC: RRR, no murmurs, rubs, gallops RESPIRATORY:  Clear to auscultation without rales, wheezing or rhonchi  ABDOMEN: Soft, non-tender, non-distended MUSCULOSKELETAL:  No edema; No deformity  SKIN: Warm and dry.  Cath site healing well, +2 pulses proximal to and distal from insertion site. NEUROLOGIC:  Alert and oriented x 3 PSYCHIATRIC:  Normal affect   ASSESSMENT:    1. Coronary artery disease involving native coronary artery of native heart without angina pectoris   2. S/P CABG x 4   3. PAF (paroxysmal atrial fibrillation) (HCC)   4. Presence of Watchman left atrial appendage closure device   5. Mixed hyperlipidemia     PLAN:    In order of problems listed above:  CAD-s/p CABG x 4 in 2021>>07/09/2024 s/p DES to mid circumflex lesion and angioplasty to side branch. Stable with no anginal symptoms. No indication for ischemic evaluation.  Heart healthy diet and regular cardiovascular exercise encouraged.  Continue aspirin  81 mg daily, Effient  10 mg daily.  He has heard from cardiac rehab already and is planning to participate. PAF/presence of Watchman device-s/p watchman implantation in 2022.  Palpitations-currently quiescent. Hyperlipidemia-recent LDL was 87 earlier this month we will plan to start him on Repatha  140 mg injected in the skin every 14 days.  In 8 weeks we will repeat CMP, fasting lipid panel, APO B.  LP(a) on 07/10/2024 was 44.6.  Disposition- labs per above, start Repatha , follow-up in 3 months with Dr. Monetta       Medication Adjustments/Labs and Tests  Ordered: Current medicines are reviewed at length with the patient today.  Concerns regarding medicines are outlined above.  Orders Placed This Encounter  Procedures  Lipid Panel+ApoB   Comprehensive metabolic panel with GFR   Meds ordered this encounter  Medications   Evolocumab  (REPATHA  SURECLICK) 140 MG/ML SOAJ    Sig: Inject 140 mg into the skin every 14 (fourteen) days.    Dispense:  2 mL    Refill:  2    Patient Instructions  Medication Instructions:  Your physician has recommended you make the following change in your medication:  Start Repatha  140 mg injectable every 2 weeks  *If you need a refill on your cardiac medications before your next appointment, please call your pharmacy*  Lab Work: Your physician recommends that you return for lab work in: 8 weeks for CMP, Lipid and APOB  If you have labs (blood work) drawn today and your tests are completely normal, you will receive your results only by: MyChart Message (if you have MyChart) OR A paper copy in the mail If you have any lab test that is abnormal or we need to change your treatment, we will call you to review the results.  Testing/Procedures: NONE  Follow-Up: At [facility], you and your health needs are our priority.  As part of our continuing mission to provide you with exceptional heart care, our providers are all part of one team.  This team includes your primary Cardiologist (physician) and Advanced Practice Providers or APPs (Physician Assistants and Nurse Practitioners) who all work together to provide you with the care you need, when you need it.  Your next appointment:   3 month(s)  Provider:   Redell Leiter, MD    We recommend signing up for the patient portal called MyChart.  Sign up information is provided on this After Visit Summary.  MyChart is used to connect with patients for Virtual Visits (Telemedicine).  Patients are able to view lab/test results, encounter notes, upcoming  appointments, etc.  Non-urgent messages can be sent to your provider as well.   To learn more about what you can do with MyChart, go to [email or web address].   Other Instructions        Signed, Delon JAYSON Hoover, NP  07/19/2024 9:11 AM    [facility] "

## 2024-07-18 ENCOUNTER — Encounter: Payer: Self-pay | Admitting: Cardiology

## 2024-07-18 ENCOUNTER — Other Ambulatory Visit (HOSPITAL_COMMUNITY): Payer: Self-pay

## 2024-07-18 ENCOUNTER — Ambulatory Visit: Attending: Cardiology | Admitting: Cardiology

## 2024-07-18 ENCOUNTER — Ambulatory Visit: Payer: Self-pay | Admitting: Internal Medicine

## 2024-07-18 ENCOUNTER — Telehealth: Payer: Self-pay | Admitting: Pharmacy Technician

## 2024-07-18 VITALS — BP 110/50 | HR 63 | Ht 66.0 in | Wt 175.0 lb

## 2024-07-18 DIAGNOSIS — E782 Mixed hyperlipidemia: Secondary | ICD-10-CM

## 2024-07-18 DIAGNOSIS — Z95818 Presence of other cardiac implants and grafts: Secondary | ICD-10-CM | POA: Diagnosis not present

## 2024-07-18 DIAGNOSIS — I2510 Atherosclerotic heart disease of native coronary artery without angina pectoris: Secondary | ICD-10-CM

## 2024-07-18 DIAGNOSIS — I480 Paroxysmal atrial fibrillation: Secondary | ICD-10-CM

## 2024-07-18 DIAGNOSIS — Z951 Presence of aortocoronary bypass graft: Secondary | ICD-10-CM

## 2024-07-18 MED ORDER — REPATHA SURECLICK 140 MG/ML ~~LOC~~ SOAJ: 140.0000 mg | SUBCUTANEOUS | 2 refills | Status: DC

## 2024-07-18 NOTE — Patient Instructions (Signed)
 Medication Instructions:  Your physician has recommended you make the following change in your medication:  Start Repatha  140 mg injectable every 2 weeks  *If you need a refill on your cardiac medications before your next appointment, please call your pharmacy*  Lab Work: Your physician recommends that you return for lab work in: 8 weeks for CMP, Lipid and APOB  If you have labs (blood work) drawn today and your tests are completely normal, you will receive your results only by: MyChart Message (if you have MyChart) OR A paper copy in the mail If you have any lab test that is abnormal or we need to change your treatment, we will call you to review the results.  Testing/Procedures: NONE  Follow-Up: At [facility], you and your health needs are our priority.  As part of our continuing mission to provide you with exceptional heart care, our providers are all part of one team.  This team includes your primary Cardiologist (physician) and Advanced Practice Providers or APPs (Physician Assistants and Nurse Practitioners) who all work together to provide you with the care you need, when you need it.  Your next appointment:   3 month(s)  Provider:   Redell Leiter, MD    We recommend signing up for the patient portal called MyChart.  Sign up information is provided on this After Visit Summary.  MyChart is used to connect with patients for Virtual Visits (Telemedicine).  Patients are able to view lab/test results, encounter notes, upcoming appointments, etc.  Non-urgent messages can be sent to your provider as well.   To learn more about what you can do with MyChart, go to [email or web address].   Other Instructions

## 2024-07-18 NOTE — Telephone Encounter (Signed)
 Pharmacy Patient Advocate Encounter  Received notification from [facility] that Prior Authorization for repatha  has been APPROVED from 07/18/24 to 07/18/25   PA #/Case ID/Reference #: [id number]

## 2024-07-18 NOTE — Telephone Encounter (Signed)
   Pharmacy Patient Advocate Encounter   Received notification from CoverMyMeds that prior authorization for repatha  is required/requested.   Insurance verification completed.   The patient is insured through [facility] .   Per test claim: PA required; PA submitted to above mentioned insurance via Latent Key/confirmation #/EOC [id number] Status is pending   Insurance has michael not [employer]

## 2024-07-19 ENCOUNTER — Encounter: Admitting: Gastroenterology

## 2024-07-21 DIAGNOSIS — K861 Other chronic pancreatitis: Secondary | ICD-10-CM | POA: Insufficient documentation

## 2024-07-23 ENCOUNTER — Telehealth: Payer: Self-pay | Admitting: *Deleted

## 2024-07-23 ENCOUNTER — Telehealth: Payer: Self-pay | Admitting: Cardiology

## 2024-07-23 MED ORDER — EZETIMIBE 10 MG PO TABS: 10.0000 mg | ORAL_TABLET | Freq: Every day | ORAL | 3 refills | Status: DC

## 2024-07-23 MED ORDER — PRASUGREL HCL 10 MG PO TABS: 10.0000 mg | ORAL_TABLET | Freq: Every day | ORAL | 3 refills | Status: DC

## 2024-07-23 NOTE — Telephone Encounter (Signed)
Pt's medications were already sent to pt's pharmacy as requested. Confirmation received.  

## 2024-07-23 NOTE — Telephone Encounter (Signed)
 Rx refill sent to pharmacy.

## 2024-07-23 NOTE — Telephone Encounter (Signed)
 Patient came by office requesting a refill on  ezetimibe  (ZETIA ) 10 MG tablet  prasugrel  (EFFIENT ) 10 MG TABS tablet   Please send refill to [facility] in [place].

## 2024-07-31 MED ORDER — PRASUGREL HCL 10 MG PO TABS: 10.0000 mg | ORAL_TABLET | Freq: Every day | ORAL | 3 refills | Status: AC

## 2024-07-31 MED ORDER — EZETIMIBE 10 MG PO TABS: 10.0000 mg | ORAL_TABLET | Freq: Every day | ORAL | 3 refills | Status: DC

## 2024-08-22 ENCOUNTER — Other Ambulatory Visit: Payer: Self-pay | Admitting: Cardiology

## 2024-09-06 ENCOUNTER — Ambulatory Visit: Attending: Cardiology

## 2024-09-06 ENCOUNTER — Ambulatory Visit: Attending: Cardiology | Admitting: Cardiology

## 2024-09-06 ENCOUNTER — Encounter: Payer: Self-pay | Admitting: Cardiology

## 2024-09-06 VITALS — BP 100/60 | HR 68 | Ht 66.0 in | Wt 175.0 lb

## 2024-09-06 DIAGNOSIS — I480 Paroxysmal atrial fibrillation: Secondary | ICD-10-CM | POA: Diagnosis not present

## 2024-09-06 DIAGNOSIS — Z951 Presence of aortocoronary bypass graft: Secondary | ICD-10-CM | POA: Diagnosis not present

## 2024-09-06 DIAGNOSIS — I493 Ventricular premature depolarization: Secondary | ICD-10-CM

## 2024-09-06 DIAGNOSIS — I2510 Atherosclerotic heart disease of native coronary artery without angina pectoris: Secondary | ICD-10-CM

## 2024-09-06 DIAGNOSIS — Z95818 Presence of other cardiac implants and grafts: Secondary | ICD-10-CM

## 2024-09-06 DIAGNOSIS — E782 Mixed hyperlipidemia: Secondary | ICD-10-CM

## 2024-09-06 NOTE — Progress Notes (Signed)
 Cardiology Office Note:    Date:  09/06/2024   ID:  Derek Moore [birth year], MRN [id number]  PCP:  Thurmond Cathlyn LABOR., MD  Cardiologist:  Redell Leiter, MD    Referring MD: Thurmond Cathlyn LABOR., MD    ASSESSMENT:    1. Coronary artery disease involving native coronary artery of native heart without angina pectoris   2. S/P CABG x 4   3. PAF (paroxysmal atrial fibrillation) (HCC)   4. Presence of Watchman left atrial appendage closure device   5. Mixed hyperlipidemia    PLAN:    In order of problems listed above:  I reviewed with [name] that he has done quite well following his coronary bypass surgery NYHA association class I Continue antiplatelet therapy aspirin  not anticoagulated Repatha . He is having PVCs noted in cardiac rehab check 1 week ZIO monitor There were several medications on his list that I am not sure he is taking we will review them before he leaves the office He is on dual antiplatelet therapy after his recent PCI August   Next appointment: I will plan to see him in 1 year   Medication Adjustments/Labs and Tests Ordered: Current medicines are reviewed at length with the patient today.  Concerns regarding medicines are outlined above.  No orders of the defined types were placed in this encounter.  No orders of the defined types were placed in this encounter.    History of Present Illness:    Derek Moore is a 68 y.o. male with a hx of paroxysmal atrial fibrillation with Watchman left atrial appendage closure device coronary artery disease with CABG type 2 diabetes hyperlipidemia and iron deficiency anemia last seen 12/01/2022.  Compliance with diet, lifestyle and medications: Yes  [name] continues to do well goes to exercise every morning was told he was having frequent PVCs he is aware. No chest pain edema shortness of breath or syncope Recent labs reviewed he is intolerant of Zetia  with severe GI symptoms continues Repatha  August cholesterol 150  LDL 87 non-HDL cholesterol 101 hemoglobin 12.7 creatinine 0.93 potassium 3.5 and normal TSH  Recent labs 07/17/2024 hemoglobin 12.6 platelets 97,000 A1c 6.4% Past Medical History:  Diagnosis Date   Abnormal cardiac CT angiography    Abnormal EKG 03/09/2018   Acquired hypothyroidism 03/07/2018   Acute coronary syndrome (HCC) 07/06/2024   Allergy    ANA positive 10/02/2020   Formatting of this note might be different from the original.  Homogenous. Other tests negartive. Probably false.  See note 10/02/2020   Aortic atherosclerosis 07/17/2020   Formatting of this note might be different from the original. Seen on [facility] 06/2019   Arthritis    Asthma    CAD (coronary artery disease), native coronary artery    Chronic gout without tophus 03/07/2018   Coronary artery disease involving native coronary artery of native heart without angina pectoris 07/17/2020   Formatting of this note might be different from the original.  Seen on [facility] 06/2019     Formatting of this note might be different from the original.  Status post four-vessel bypass     Formatting of this note might be different from the original.  Status post four-vessel bypass 09/2020   Coronary artery disease involving native coronary artery with unstable angina pectoris [facility])    Coronary artery disease of native artery of native heart with stable angina pectoris    Cough syncope 1999   Dental caries    Depression 03/07/2018  Diplopia 10/11/2018   DISH (diffuse idiopathic skeletal hyperostosis) 07/17/2020   Formatting of this note might be different from the original. Seen on [facility] 06/2019   Dizzy 05/09/2018   Gastroesophageal reflux disease without esophagitis 03/07/2018   Formatting of this note might be different from the original. Long term   GERD (gastroesophageal reflux disease) 03/07/2018   Globus sensation 09/22/2022   Gout 03/07/2018   Hay fever    Heel spur, left 02/07/2020   History of asthma 11/06/2020   History of  iron deficiency anemia 02/13/2022   History of smokeless tobacco use 07/17/2020   Formatting of this note might be different from the original.  Discussed chantix.  He wants to use. Did well in past. Off label, he understands.     Last Assessment & Plan:   Formatting of this note might be different from the original.  Patient has been without smokeless tobacco for 1 week now   Hyperlipidemia 03/07/2018   Hyperlipidemia associated with type 2 diabetes mellitus (HCC) 03/07/2018   Hypomagnesemia 01/29/2021   Hypothyroidism (acquired) 03/07/2018   Formatting of this note might be different from the original. 1990s.   Mild cognitive impairment 07/01/2022   Mild episode of recurrent major depressive disorder 07/17/2020   Myalgia due to statin 08/05/2021   Myasthenia gravis (HCC) 07/06/2024   Obesity    Obesity (BMI 30-39.9) 12/05/2020   Ocular myasthenia gravis (HCC) 07/17/2020   Formatting of this note might be different from the original. Ocular.  Follows at [facility].  Sees neurology and eye doctor.   Orthostatic hypotension 12/19/2020   PAF (paroxysmal atrial fibrillation) (HCC) 11/12/2021   Palpitation 03/08/2018   Plantar fasciitis 02/07/2020   Presence of Watchman left atrial appendage closure device 11/12/2021   with 24mm Watchman FLX woth Dr. Cindie   Primary osteoarthritis involving multiple joints 07/17/2020   Recurrent sinusitis    S/P CABG x 4 10/27/2020   Smokeless tobacco use 07/17/2020   Formatting of this note might be different from the original. Discussed chantix.  He wants to use. Did well in past. Off label, he understands.   Subcutaneous nodules 01/27/2021   Thrombus of left atrial appendage    Tightness of heel cord, left 02/07/2020   Type 2 diabetes mellitus (HCC) 03/07/2018   Type 2 diabetes mellitus with hyperlipidemia (HCC) 03/07/2018   Type 2 diabetes mellitus without complication, without long-term current use of insulin  (HCC) 03/07/2018   Formatting of this note  might be different from the original. 2014.   Vitamin B12 deficiency 09/25/2020    Current Medications: Current Meds  Medication Sig   acetaminophen  (TYLENOL ) 500 MG tablet Take 500-1,000 mg by mouth every 6 (six) hours as needed (for pain.).   albuterol  (VENTOLIN  HFA) 108 (90 Base) MCG/ACT inhaler Inhale into the lungs every 6 (six) hours as needed for wheezing or shortness of breath.   aspirin  EC 81 MG tablet Take 1 tablet (81 mg total) by mouth at bedtime.   Dextran 70-Hypromellose, PF, (ARTIFICIAL TEARS PF) 0.1-0.3 % SOLN Place 1 drop into both eyes 3 (three) times daily as needed (dry/irritated eyes.).   Evolocumab  (REPATHA  SURECLICK) 140 MG/ML SOAJ Inject 140 mg into the skin every 14 (fourteen) days.   fludrocortisone  (FLORINEF ) 0.1 MG tablet TAKE 1 TABLET BY MOUTH DAILY.   levothyroxine  (SYNTHROID ) 137 MCG tablet Take 137 mcg by mouth daily.   Magnesium  Oxide 250 MG TABS Take 250 mg by mouth daily.   metFORMIN (GLUCOPHAGE) 1000 MG tablet  Take 1,000 mg by mouth 2 (two) times daily with a meal.   midodrine  (PROAMATINE ) 5 MG tablet TAKE 1 TABLET BY MOUTH THREE TIMES DAILY WITH MEALS.   mycophenolate  (CELLCEPT ) 500 MG tablet Take 1,000 mg by mouth 2 (two) times daily.   Omega-3 Fatty Acids (FISH OIL ULTRA) 1400 MG CAPS Take 1,400 mg by mouth in the morning and at bedtime.   pantoprazole  (PROTONIX ) 40 MG tablet Take 40 mg by mouth at bedtime.   PARoxetine  (PAXIL ) 40 MG tablet Take 40 mg by mouth daily.   prasugrel  (EFFIENT ) 10 MG TABS tablet Take 1 tablet (10 mg total) by mouth daily.   pyridostigmine  (MESTINON ) 60 MG tablet Take 90 mg by mouth 4 (four) times daily. Take 1 1/2 tablet (90 mg) 4 times daily   vitamin B-12 (CYANOCOBALAMIN) 1000 MCG tablet Take 1,000 mcg by mouth daily.      EKGs/Labs/Other Studies Reviewed:    The following studies were reviewed today:         Recent Labs: 07/07/2024: ALT 87 07/10/2024: BUN 12; Creatinine, Ser 0.93; Hemoglobin 12.7; Platelets 163;  Potassium 3.5; Sodium 140  Recent Lipid Panel    Component Value Date/Time   CHOL 150 07/08/2024 0302   CHOL 136 06/17/2021 0850   TRIG 71 07/08/2024 0302   HDL 49 07/08/2024 0302   HDL 50 06/17/2021 0850   CHOLHDL 3.1 07/08/2024 0302   VLDL 14 07/08/2024 0302   LDLCALC 87 07/08/2024 0302   LDLCALC 67 06/17/2021 0850    Physical Exam:    VS:  BP 100/60   Pulse 68   Ht 5' 6 (1.676 m)   Wt 175 lb (79.4 kg)   SpO2 96%   BMI 28.25 kg/m     Wt Readings from Last 3 Encounters:  09/06/24 175 lb (79.4 kg)  07/18/24 175 lb (79.4 kg)  07/09/24 174 lb 2.6 oz (79 kg)     GEN:  Well nourished, well developed in no acute distress HEENT: Normal NECK: No JVD; No carotid bruits LYMPHATICS: No lymphadenopathy CARDIAC: RRR, no murmurs, rubs, gallops RESPIRATORY:  Clear to auscultation without rales, wheezing or rhonchi  ABDOMEN: Soft, non-tender, non-distended MUSCULOSKELETAL:  No edema; No deformity  SKIN: Warm and dry NEUROLOGIC:  Alert and oriented x 3 PSYCHIATRIC:  Normal affect    Signed, Redell Leiter, MD  09/06/2024 8:31 AM    [facility]

## 2024-09-06 NOTE — Patient Instructions (Signed)
 Medication Instructions:  Your physician recommends that you continue on your current medications as directed. Please refer to the Current Medication list given to you today.  *If you need a refill on your cardiac medications before your next appointment, please call your pharmacy*  Lab Work: None If you have labs (blood work) drawn today and your tests are completely normal, you will receive your results only by: MyChart Message (if you have MyChart) OR A paper copy in the mail If you have any lab test that is abnormal or we need to change your treatment, we will call you to review the results.  Testing/Procedures: A zio monitor was ordered today. It will remain on for 7 days. You will then return monitor and event diary in provided box. It takes 1-2 weeks for report to be downloaded and returned to us . We will call you with the results. If monitor falls off or has orange flashing light, please call Zio for further instructions.    Follow-Up: At [facility], you and your health needs are our priority.  As part of our continuing mission to provide you with exceptional heart care, our providers are all part of one team.  This team includes your primary Cardiologist (physician) and Advanced Practice Providers or APPs (Physician Assistants and Nurse Practitioners) who all work together to provide you with the care you need, when you need it.  Your next appointment:   1 year(s)  Provider:   Redell Leiter, MD    We recommend signing up for the patient portal called MyChart.  Sign up information is provided on this After Visit Summary.  MyChart is used to connect with patients for Virtual Visits (Telemedicine).  Patients are able to view lab/test results, encounter notes, upcoming appointments, etc.  Non-urgent messages can be sent to your provider as well.   To learn more about what you can do with MyChart, go to [email or web address].   Other Instructions None

## 2024-09-19 ENCOUNTER — Ambulatory Visit: Admitting: Gastroenterology

## 2024-09-20 ENCOUNTER — Ambulatory Visit: Payer: Self-pay | Admitting: Cardiology

## 2024-09-20 ENCOUNTER — Other Ambulatory Visit: Payer: Self-pay

## 2024-09-20 DIAGNOSIS — I493 Ventricular premature depolarization: Secondary | ICD-10-CM | POA: Diagnosis not present

## 2024-09-22 LAB — BASIC METABOLIC PANEL WITH GFR
BUN/Creatinine Ratio: 22 (ref 10–24)
BUN: 18 mg/dL (ref 8–27)
CO2: 20 mmol/L (ref 20–29)
Calcium: 8.9 mg/dL (ref 8.6–10.2)
Chloride: 105 mmol/L (ref 96–106)
Creatinine, Ser: 0.81 mg/dL (ref 0.76–1.27)
Glucose: 147 mg/dL — ABNORMAL HIGH (ref 70–99)
Potassium: 4.4 mmol/L (ref 3.5–5.2)
Sodium: 138 mmol/L (ref 134–144)
eGFR: 95 mL/min/1.73 (ref >59)

## 2024-09-22 LAB — MAGNESIUM: Magnesium: 1.8 mg/dL (ref 1.6–2.3)

## 2024-09-23 ENCOUNTER — Ambulatory Visit: Payer: Self-pay | Admitting: Cardiology

## 2024-10-09 ENCOUNTER — Other Ambulatory Visit: Payer: Self-pay | Admitting: Cardiology

## 2024-10-12 ENCOUNTER — Other Ambulatory Visit: Payer: Self-pay | Admitting: Cardiology

## 2024-10-12 MED ORDER — REPATHA SURECLICK 140 MG/ML ~~LOC~~ SOAJ: 140.0000 mg | SUBCUTANEOUS | 11 refills | Status: AC

## 2024-10-18 NOTE — Progress Notes (Deleted)
 Cardiology Office Note:    Date:  10/18/2024   ID:  Derek Moore 1956/04/20, MRN [id number]  PCP:  Thurmond Cathlyn LABOR., MD  Cardiologist:  Redell Leiter, MD    Referring MD: Thurmond Cathlyn LABOR., MD    ASSESSMENT:    No diagnosis found. PLAN:    In order of problems listed above:  ***   Next appointment: ***   Medication Adjustments/Labs and Tests Ordered: Current medicines are reviewed at length with the patient today.  Concerns regarding medicines are outlined above.  No orders of the defined types were placed in this encounter.  No orders of the defined types were placed in this encounter.    History of Present Illness:    Derek Moore is a 68 y.o. male with a hx of paroxysmal atrial fibrillation with Watchman device coronary artery disease his CABG type 2 diabetes hyperlipidemia and iron deficiency anemia last seen 09/06/2024. Compliance with diet, lifestyle and medications: *** Past Medical History:  Diagnosis Date   Abnormal cardiac CT angiography    Abnormal EKG 03/09/2018   Acquired hypothyroidism 03/07/2018   Acute coronary syndrome (HCC) 07/06/2024   Allergy    ANA positive 10/02/2020   Formatting of this note might be different from the original.  Homogenous. Other tests negartive. Probably false.  See note 10/02/2020   Aortic atherosclerosis 07/17/2020   Formatting of this note might be different from the original. Seen on [facility] 06/2019   Arthritis    Asthma    CAD (coronary artery disease), native coronary artery    Chronic gout without tophus 03/07/2018   Coronary artery disease involving native coronary artery of native heart without angina pectoris 07/17/2020   Formatting of this note might be different from the original.  Seen on [facility] 06/2019     Formatting of this note might be different from the original.  Status post four-vessel bypass     Formatting of this note might be different from the original.  Status post four-vessel bypass 09/2020    Coronary artery disease involving native coronary artery with unstable angina pectoris (HCC)    Coronary artery disease of native artery of native heart with stable angina pectoris    Cough syncope 1999   Dental caries    Depression 03/07/2018   Diplopia 10/11/2018   DISH (diffuse idiopathic skeletal hyperostosis) 07/17/2020   Formatting of this note might be different from the original. Seen on [facility] 06/2019   Dizzy 05/09/2018   Gastroesophageal reflux disease without esophagitis 03/07/2018   Formatting of this note might be different from the original. Long term   GERD (gastroesophageal reflux disease) 03/07/2018   Globus sensation 09/22/2022   Gout 03/07/2018   Hay fever    Heel spur, left 02/07/2020   History of asthma 11/06/2020   History of iron deficiency anemia 02/13/2022   History of smokeless tobacco use 07/17/2020   Formatting of this note might be different from the original.  Discussed chantix.  He wants to use. Did well in past. Off label, he understands.     Last Assessment & Plan:   Formatting of this note might be different from the original.  Patient has been without smokeless tobacco for 1 week now   Hyperlipidemia 03/07/2018   Hyperlipidemia associated with type 2 diabetes mellitus (HCC) 03/07/2018   Hypomagnesemia 01/29/2021   Hypothyroidism (acquired) 03/07/2018   Formatting of this note might be different from the original. 1990s.   Mild cognitive  impairment 07/01/2022   Mild episode of recurrent major depressive disorder 07/17/2020   Myalgia due to statin 08/05/2021   Myasthenia gravis (HCC) 07/06/2024   Obesity    Obesity (BMI 30-39.9) 12/05/2020   Ocular myasthenia gravis (HCC) 07/17/2020   Formatting of this note might be different from the original. Ocular.  Follows at [facility].  Sees neurology and eye doctor.   Orthostatic hypotension 12/19/2020   PAF (paroxysmal atrial fibrillation) (HCC) 11/12/2021   Palpitation 03/08/2018   Plantar fasciitis  02/07/2020   Presence of Watchman left atrial appendage closure device 11/12/2021   with 24mm Watchman FLX woth Dr. Cindie   Primary osteoarthritis involving multiple joints 07/17/2020   Recurrent sinusitis    S/P CABG x 4 10/27/2020   Smokeless tobacco use 07/17/2020   Formatting of this note might be different from the original. Discussed chantix.  He wants to use. Did well in past. Off label, he understands.   Subcutaneous nodules 01/27/2021   Thrombus of left atrial appendage    Tightness of heel cord, left 02/07/2020   Type 2 diabetes mellitus (HCC) 03/07/2018   Type 2 diabetes mellitus with hyperlipidemia (HCC) 03/07/2018   Type 2 diabetes mellitus without complication, without long-term current use of insulin  (HCC) 03/07/2018   Formatting of this note might be different from the original. 2014.   Vitamin B12 deficiency 09/25/2020    Current Medications: No outpatient medications have been marked as taking for the 10/22/24 encounter (Appointment) with Monetta Redell PARAS, MD.      EKGs/Labs/Other Studies Reviewed:    The following studies were reviewed today:  Cardiac Studies & Procedures   ______________________________________________________________________________________________ CARDIAC CATHETERIZATION  CARDIAC CATHETERIZATION 07/09/2024  Conclusion Conclusions: Severe native coronary artery disease, including 80% proximal LAD stenosis with competitive flow from LIMA-D2 that backfills the distal LAD, sequential 60% ostial (RFR 0.99), 90% mid, and 40% mid/distal LCx lesions, 90% OM1 and 99% OM2 stenoses, and chronic total occlusion of mid/distal RCA. Widely patent LIMA-D2, SVG-D1, and SVG-rPDA. Occluded SVG-OM1. Mildly elevated left ventricular filling pressure (LVEDP 16 mmHg). Successful bifurcation PCI of proximal/mid LCx and OM1 using provisional technique with placement of Synergy XD 2.75 x 12 mm drug-eluting stent in LCx and angioplasty of OM1 using 2.25 mm Cheney  balloon.  Final angiogram demonstrates 0% residual stenosis in LCx with TIMI-3 flow; there is 30% ostial OM1 stenosis with non-flow-limiting dissection that was not stented.  Conclusions: Dual antiplatelet therapy with aspirin  and prasugrel  for up to 12 months; consider switching to clopidogrel  to minimize bleeding risk if CYP2C19 genotype is favorable. Aggressive secondary prevention of CAD; favor medical management of severe OM2 stenosis, with is not favorable for PCI.  Lonni Hanson, MD [facility]  Findings Coronary Findings Diagnostic  Dominance: Right  Left Main Ost LM to Ost LAD lesion is 20% stenosed with 60% stenosed side branch in Ost Cx. The lesion is mildly calcified. Pressure gradient was performed on the lesion. RFR: 0.99.  Left Anterior Descending Vessel is small. Ost LAD to Prox LAD lesion is 30% stenosed. The lesion is segmental. The lesion is calcified. Prox LAD to Mid LAD lesion is 80% stenosed with 60% stenosed side branch in 1st Diag. The lesion is located at the bifurcation, eccentric, concentric and smooth. CTA FFR  First Diagonal Branch Vessel is small in size.  First Septal Branch Vessel is small in size.  Ramus Intermedius Vessel is small.  Left Circumflex Vessel is moderate in size. Mid Cx lesion is 90% stenosed with 90%  stenosed side branch in 1st Mrg. The lesion is located at the bifurcation. Mid Cx to Dist Cx lesion is 40% stenosed with 99% stenosed side branch in 2nd Mrg. The lesion is focal and concentric.  First Obtuse Marginal Branch Vessel is small in size.  Second Obtuse Marginal Branch Vessel is small in size.  First Left Posterolateral Branch Vessel is small in size.  Second Left Posterolateral Branch Vessel is small in size.  Left Posterior Atrioventricular Artery Vessel is large in size.  Right Coronary Artery Prox RCA lesion is 95% stenosed. The lesion is segmental and eccentric. Prox RCA to Mid RCA lesion is 50%  stenosed. The lesion is segmental and irregular. Mid RCA lesion is 70% stenosed. The lesion is focal and concentric. Mid RCA to Dist RCA lesion is 100% stenosed. The lesion is chronically occluded.  Acute Marginal Branch Vessel is small in size.  Right Ventricular Branch Vessel is small in size.  Right Posterior Atrioventricular Artery Collaterals RPAV filled by collaterals from LPAV.  First Right Posterolateral Branch Vessel is small in size.  Second Right Posterolateral Branch Vessel is moderate in size.  Saphenous Graft To RPDA SVG graft was visualized by angiography and is normal in caliber.  The graft exhibits no disease.  Saphenous Graft To 1st Mrg SVG graft was visualized by angiography. Origin to Insertion lesion is 100% stenosed.  LIMA LIMA Graft To 2nd Diag LIMA graft was visualized by angiography and is normal in caliber.  The graft exhibits no disease.  Saphenous Graft To 1st Diag SVG graft was visualized by angiography and is normal in caliber.  The graft exhibits no disease.  Intervention  Mid Cx lesion with side branch in 1st Mrg Stent - Main Branch A drug-eluting stent was successfully placed using a STENT SYNERGY XD 2.75X12 in the main branch. Angioplasty - Side Branch Angioplasty was performed in the side branch. Balloon angioplasty was performed using a BALLOON Snyder EMERGE MR 2.25X12. Post-Intervention Lesion Assessment The intervention was successful. Pre-interventional TIMI flow is 3. Post-intervention TIMI flow is 3. At this lesion, a dissection occurred. There is a 0% residual stenosis in the main branch post intervention. There is a 30% residual stenosis in the side branch post intervention.   CARDIAC CATHETERIZATION  CARDIAC CATHETERIZATION 10/08/2020  Conclusion  There is mild left ventricular systolic dysfunction.  LV end diastolic pressure is normal.  There is no aortic valve stenosis.  Prox RCA lesion is 95% stenosed. Prox RCA to  Mid RCA lesion is 50% stenosed. Mid RCA lesion is 70% stenosed. Mid RCA to Dist RCA lesion is 100% stenosed. (Fills via left-right collaterals from LCx)  Ost LM to Ost LAD lesion is 20% stenosed with 30% stenosed side branch in Ost Cx.  Ost LAD to Prox LAD lesion is 30% stenosed.  Mid Cx lesion is 90% stenosed with 90% stenosed side branch in 1st Mrg.  Mid Cx to Dist Cx lesion is 70% stenosed with 95% stenosed side branch in 2nd Mrg.  Prox LAD to Mid LAD lesion is 70% stenosed with 60% stenosed side branch in 1st Diag.  SUMMARY  Severe three-vessel disease:  Extensive proximal to mid 90 having 70 and 80% stenosis followed by 100% CTO (fills via left to right collaterals from AVG LCx);  LEFT MAIN diffuse 10 to 20% stenosis, 20% ostial LCx with 90% focal concentric stenosis at OM1 (1st Mrg) followed by a second 70% stenosis at a very small caliber OM 2,  Segmental calcified 40% proximal  LAD with DFR positive long segment of 60-65% at the takeoff of D1 (DFR was 0.85, CT FFR was 0.79).  LAD after D1 (1st Diag) bifurcates more distally into tandem vessels of D2 and basely the septal LAD-is relatively small caliber  Mildly reduced LVEF with mild global hypokinesis EF roughly 45% by visual estimate.  Low LVEDP   RECOMMENDATION  He is relatively asymptomatic, recommend outpatient CVTS consultation -> need to consider the benefits of full revascularization with CABG versus PCI of the bifurcation LCx-OM1 lesion and bifurcation LAD-D1 lesion.  No antiplatelet agent besides aspirin  with planned CVTS consultation  Continue aggressive GUIDELINE DIRECTED MEDICAL THERAPY  Follow-up with Dr. Monetta   That way Alm Clay, MD  Findings Coronary Findings Diagnostic  Dominance: Right  Left Main Ost LM to Ost LAD lesion is 20% stenosed with 30% stenosed side branch in Ost Cx. The lesion is mildly calcified.  Left Anterior Descending Vessel is small. Ost LAD to Prox LAD lesion is 30%  stenosed. The lesion is segmental. The lesion is calcified. Prox LAD to Mid LAD lesion is 70% stenosed with 60% stenosed side branch in 1st Diag. The lesion is located at the bifurcation, eccentric, concentric and smooth. CTA FFR  First Diagonal Branch Vessel is small in size.  First Septal Branch Vessel is small in size.  Ramus Intermedius Vessel is small.  Left Circumflex Vessel is moderate in size. Mid Cx lesion is 90% stenosed with 90% stenosed side branch in 1st Mrg. The lesion is located at the bifurcation. Mid Cx to Dist Cx lesion is 70% stenosed with 95% stenosed side branch in 2nd Mrg. The lesion is focal and concentric.  First Obtuse Marginal Branch Vessel is small in size.  Second Obtuse Marginal Branch Vessel is small in size.  First Left Posterolateral Branch Vessel is small in size.  Second Left Posterolateral Branch Vessel is small in size.  Left Posterior Atrioventricular Artery Vessel is large in size.  Right Coronary Artery Prox RCA lesion is 95% stenosed. The lesion is segmental and eccentric. Prox RCA to Mid RCA lesion is 50% stenosed. The lesion is segmental and irregular. Mid RCA lesion is 70% stenosed. The lesion is focal and concentric. Mid RCA to Dist RCA lesion is 100% stenosed. The lesion is chronically occluded.  Acute Marginal Branch Vessel is small in size.  Right Ventricular Branch Vessel is small in size.  Right Posterior Atrioventricular Artery Collaterals RPAV filled by collaterals from LPAV.  First Right Posterolateral Branch Vessel is small in size.  Second Right Posterolateral Branch Vessel is moderate in size.  Intervention  No interventions have been documented.   STRESS TESTS  ECHOCARDIOGRAM STRESS TEST 05/01/2018  Narrative *[facility]* [address] [place], [place] [address] [phone]  ------------------------------------------------------------------- Stress Echocardiography  Patient:     Derek Moore, Derek Moore MR #:       [id number] Study Date: 05/01/2018 Gender:     M Age:        52 Height:     167.6 cm Weight:     101.7 kg BSA:        2.22 m^2 Pt. Status: Room:  ATTENDING    Redell Monetta, MD ORDERING     Redell Monetta, MD REFERRING    Redell Monetta, MD PERFORMING   [facility] SONOGRAPHER  Jimmy Reel, RDCS  cc:  -------------------------------------------------------------------  ------------------------------------------------------------------- Indications:      Abnormal EKG 794.31.  Palpitations 785.1.  ------------------------------------------------------------------- History:  Risk factors:  Diabetes mellitus. Dyslipidemia.  ------------------------------------------------------------------- Study Conclusions  - Stress ECG conclusions: There were no stress arrhythmias or conduction abnormalities. The stress ECG was negative for ischemia. - Staged echo: There was no echocardiographic evidence for stress-induced ischemia.  Impressions:  - 1. Stress echo negative for evidence of inducible ischemia. 2. Normal left ventricular systolic function at rest and good augmentation of the left ventricular segments at stress 3. Normal exercise capacity.  ------------------------------------------------------------------- Study data:   Study status:  Routine.  Consent:  The risks, benefits, and alternatives to the procedure were explained to the patient and informed consent was obtained.  Procedure:  The patient reported no pain pre or post test. Initial setup. The patient was brought to the laboratory. A baseline ECG was recorded. Surface ECG leads and automatic cuff blood pressure measurements were monitored. Treadmill exercise testing was performed using the Bruce protocol. The patient exercised for 8 min 1 sec, to protocol stage 3, to a maximal work rate of 10.1 mets. Exercise was terminated due to moderate fatigue. The patient was positioned  for image acquisition and recovery monitoring. Transthoracic stress echocardiography. Images were captured at baseline and peak exercise.  Study completion:  The patient tolerated the procedure well. There were no complications.          Bruce protocol. Stress echocardiography.  Birthdate:  Patient birthdate: 1956/11/27.  Age: Patient is 69 yr old.  Sex:  Gender: male.    BMI: 36.2 kg/m^2. Blood pressure:     100/66  Patient status:  Outpatient.  Study date:  Study date: 05/01/2018. Study time: 10:17 AM.  -------------------------------------------------------------------  ------------------------------------------------------------------- Baseline ECG:  Normal.  ------------------------------------------------------------------- Stress protocol:  +--------+---------------+---+------------+--------+ !Stage   !Time into phase!HR !BP (mmHg)   !Symptoms! +--------+---------------+---+------------+--------+ !Baseline!---------------!68 !116/77 (90) !None    ! +--------+---------------+---+------------+--------+ !Stage 1 !---------------!88 !153/72 (99) !--------! +--------+---------------+---+------------+--------+ !Stage 2 !---------------!100!177/71 (106)!--------! +--------+---------------+---+------------+--------+ !Stage 3 !---------------!125!------------!--------! +--------+---------------+---+------------+--------+ !Recovery!3 min 20 sec   !66 !167/65 (99) !--------! +--------+---------------+---+------------+--------+  ------------------------------------------------------------------- Stress results:   Maximal heart rate during stress was 125 bpm (80% of maximal predicted heart rate). The maximal predicted heart rate was 157 bpm.The target heart rate was achieved. The heart rate response to stress was normal. There was a normal resting blood pressure with an appropriate response to stress.  The patient experienced no chest pain during  stress.  ------------------------------------------------------------------- Stress ECG:  There were no stress arrhythmias or conduction abnormalities.  The stress ECG was negative for ischemia.  ------------------------------------------------------------------- Baseline:  - The estimated LV ejection fraction was 60%. - Normal wall motion; no LV regional wall motion abnormalities.  Peak stress:  - The estimated LV ejection fraction was 70%. - Normal wall motion; no LV regional wall motion abnormalities.  ------------------------------------------------------------------- Stress echo results:     Left ventricular ejection fraction was normal at rest and with stress. There was no echocardiographic evidence for stress-induced ischemia.  ------------------------------------------------------------------- Measurements  Left ventricle                           Value        Reference LV ID, ED, PLAX chordal                  48    mm     43 - 52 LV ID, ES, PLAX chordal        (H)       39.8  mm     23 - 38 LV  fx shortening, PLAX chordal (L)       17    %      >=29 LV PW thickness, ED                      12.7  mm     --------- IVS/LV PW ratio, ED                      0.96         <=1.3  Ventricular septum                       Value        Reference IVS thickness, ED                        12.2  mm     ---------  LVOT                                     Value        Reference LVOT ID, S                               22    mm     --------- LVOT area                                3.8   cm^2   ---------  Aorta                                    Value        Reference Aortic root ID, ED                       34    mm     ---------  Left atrium                              Value        Reference LA ID, A-P, ES                           39    mm     --------- LA ID/bsa, A-P                           1.76  cm/m^2 <=2.2  Legend: (L)  and  (H)  mark values outside specified reference  range.  ------------------------------------------------------------------- Prepared and Electronically Authenticated by  Jennifer Crape, MD 2019-06-03T14:10:12   ECHOCARDIOGRAM  ECHOCARDIOGRAM COMPLETE 07/07/2024  Narrative ECHOCARDIOGRAM REPORT    Patient Name:   Derek Moore Date of Exam: 07/07/2024 Medical Rec #:  [id number]         Height:       66.0 in Accession #:    [id number]        Weight:       175.3 lb Date of Birth:  Nov 10, 1956        BSA:  1.891 m Patient Age:    67 years          BP:           118/68 mmHg Patient Gender: M                 HR:           50 bpm. Exam Location:  Inpatient  Procedure: 2D Echo (Both Spectral and Color Flow Doppler were utilized during procedure).  Indications:    chest pain  History:        Patient has prior history of Echocardiogram examinations, most recent 12/29/2021. Prior CABG; Risk Factors:Diabetes, Dyslipidemia and Former Smoker.  Sonographer:    Tinnie Barefoot RDCS Referring Phys: [id number] MAURICIO DANIEL ARRIEN  IMPRESSIONS   1. Left ventricular ejection fraction, by estimation, is 60 to 65%. The left ventricle has normal function. The left ventricle has no regional wall motion abnormalities. Left ventricular diastolic parameters were normal. 2. Right ventricular systolic function is normal. The right ventricular size is normal. Tricuspid regurgitation signal is inadequate for assessing PA pressure. 3. Left atrial size was mildly dilated. 4. The mitral valve is abnormal. Mild mitral valve regurgitation. No evidence of mitral stenosis. 5. The aortic valve is tricuspid. Aortic valve regurgitation is not visualized. No aortic stenosis is present. 6. The inferior vena cava is normal in size with greater than 50% respiratory variability, suggesting right atrial pressure of 3 mmHg.  FINDINGS Left Ventricle: Left ventricular ejection fraction, by estimation, is 60 to 65%. The left ventricle has normal function.  The left ventricle has no regional wall motion abnormalities. The left ventricular internal cavity size was normal in size. There is no left ventricular hypertrophy. Left ventricular diastolic parameters were normal.  Right Ventricle: The right ventricular size is normal. Right vetricular wall thickness was not well visualized. Right ventricular systolic function is normal. Tricuspid regurgitation signal is inadequate for assessing PA pressure.  Left Atrium: Left atrial size was mildly dilated.  Right Atrium: Right atrial size was normal in size.  Pericardium: There is no evidence of pericardial effusion.  Mitral Valve: The mitral valve is abnormal. Mild mitral valve regurgitation. No evidence of mitral valve stenosis.  Tricuspid Valve: The tricuspid valve is normal in structure. Tricuspid valve regurgitation is trivial. No evidence of tricuspid stenosis.  Aortic Valve: The aortic valve is tricuspid. Aortic valve regurgitation is not visualized. No aortic stenosis is present. Aortic valve mean gradient measures 5.5 mmHg. Aortic valve peak gradient measures 12.1 mmHg. Aortic valve area, by VTI measures 1.81 cm.  Pulmonic Valve: The pulmonic valve was not well visualized. Pulmonic valve regurgitation is not visualized. No evidence of pulmonic stenosis.  Aorta: The aortic root is normal in size and structure.  Venous: The inferior vena cava is normal in size with greater than 50% respiratory variability, suggesting right atrial pressure of 3 mmHg.  IAS/Shunts: No atrial level shunt detected by color flow Doppler.   LEFT VENTRICLE PLAX 2D LVIDd:         5.70 cm LVIDs:         3.80 cm LV PW:         1.00 cm LV IVS:        1.00 cm LVOT diam:     2.10 cm LV SV:         77 LV SV Index:   41 LVOT Area:     3.46 cm   RIGHT VENTRICLE  IVC RV Basal diam:  2.90 cm     IVC diam: 2.20 cm RV S prime:     11.60 cm/s TAPSE (M-mode): 1.8 cm  LEFT ATRIUM             Index         RIGHT ATRIUM           Index LA diam:        4.40 cm 2.33 cm/m   RA Area:     16.10 cm LA Vol (A2C):   73.8 ml 39.03 ml/m  RA Volume:   43.80 ml  23.17 ml/m LA Vol (A4C):   70.2 ml 37.13 ml/m LA Biplane Vol: 75.5 ml 39.93 ml/m AORTIC VALVE AV Area (Vmax):    1.85 cm AV Area (Vmean):   1.98 cm AV Area (VTI):     1.81 cm AV Vmax:           173.74 cm/s AV Vmean:          106.604 cm/s AV VTI:            0.428 m AV Peak Grad:      12.1 mmHg AV Mean Grad:      5.5 mmHg LVOT Vmax:         92.70 cm/s LVOT Vmean:        60.900 cm/s LVOT VTI:          0.223 m LVOT/AV VTI ratio: 0.52  AORTA Ao Root diam: 3.10 cm Ao Asc diam:  3.10 cm  MITRAL VALVE MV Area (PHT): 2.87 cm     SHUNTS MV Decel Time: 264 msec     Systemic VTI:  0.22 m MR Peak grad: 93.3 mmHg     Systemic Diam: 2.10 cm MR Mean grad: 66.0 mmHg MR Vmax:      483.00 cm/s MR Vmean:     391.0 cm/s MV E velocity: 115.00 cm/s MV A velocity: 78.50 cm/s MV E/A ratio:  1.46  Dorn Ross MD Electronically signed by Dorn Ross MD Signature Date/Time: 07/07/2024/6:08:51 PM    Final   TEE  ECHO TEE 12/29/2021  Narrative TRANSESOPHOGEAL ECHO REPORT    Patient Name:   Derek Moore Date of Exam: 12/29/2021 Medical Rec #:  [id number]         Height:       66.0 in Accession #:    [id number]        Weight:       190.0 lb Date of Birth:  Jun 05, 1956        BSA:          1.957 m Patient Age:    65 years          BP:           102/66 mmHg Patient Gender: M                 HR:           57 bpm. Exam Location:  Inpatient  Procedure: 2D Echo, Color Doppler and Cardiac Doppler  Indications:     Watchman Follow up  History:         Patient has prior history of Echocardiogram examinations, most recent 11/12/2021. Prior CABG, Arrythmias:Atrial Fibrillation; Risk Factors:Diabetes and Dyslipidemia. 24mm Watchman Device implanted 11/12/21.  Sonographer:     Damien Senior RDCS Referring Phys:  [id number] OLE ONEIDA HOLTS Diagnosing Phys: Jerel Balding MD  PROCEDURE: After discussion of the risks and benefits  of a TEE, an informed consent was obtained from the patient. The transesophogeal probe was passed without difficulty through the esophogus of the patient. Local oropharyngeal anesthetic was provided with Cetacaine . Sedation performed by different physician. The patient was monitored while under deep sedation. Anesthestetic sedation was provided intravenously by Anesthesiology: 146mg  of Propofol , 60mg  of Lidocaine . The patient's vital signs; including heart rate, blood pressure, and oxygen saturation; remained stable throughout the procedure. The patient developed no complications during the procedure.  IMPRESSIONS   1. Left ventricular ejection fraction, by estimation, is 60 to 65%. The left ventricle has normal function. The left ventricle has no regional wall motion abnormalities. 2. Right ventricular systolic function is normal. The right ventricular size is normal. 3. Well seated Watchman device with appropriate compression and no leak. There is a tiny iatrogenic ASD with left to right shunt. No left atrial/left atrial appendage thrombus was detected. 4. The mitral valve is normal in structure. No evidence of mitral valve regurgitation. No evidence of mitral stenosis. 5. The aortic valve is normal in structure. Aortic valve regurgitation is not visualized. No aortic stenosis is present. 6. There is Moderate (Grade III) protruding plaque involving the descending aorta. 7. The inferior vena cava is normal in size with greater than 50% respiratory variability, suggesting right atrial pressure of 3 mmHg. 8. Evidence of atrial level shunting detected by color flow Doppler.  Conclusion(s)/Recommendation(s): Normal biventricular function without evidence of hemodynamically significant valvular heart disease.  FINDINGS Left Ventricle: Left ventricular ejection fraction, by estimation, is 60 to 65%. The  left ventricle has normal function. The left ventricle has no regional wall motion abnormalities. The left ventricular internal cavity size was normal in size. There is no left ventricular hypertrophy.  Right Ventricle: The right ventricular size is normal. No increase in right ventricular wall thickness. Right ventricular systolic function is normal.  Left Atrium: Well seated Watchman device with appropriate compression and no leak. There is a tiny iatrogenic ASD with left to right shunt. Left atrial size was normal in size. No left atrial/left atrial appendage thrombus was detected.  Right Atrium: Right atrial size was normal in size.  Pericardium: There is no evidence of pericardial effusion.  Mitral Valve: The mitral valve is normal in structure. No evidence of mitral valve regurgitation. No evidence of mitral valve stenosis.  Tricuspid Valve: The tricuspid valve is normal in structure. Tricuspid valve regurgitation is not demonstrated. No evidence of tricuspid stenosis.  Aortic Valve: The aortic valve is normal in structure. Aortic valve regurgitation is not visualized. No aortic stenosis is present.  Pulmonic Valve: The pulmonic valve was normal in structure. Pulmonic valve regurgitation is not visualized. No evidence of pulmonic stenosis.  Aorta: The aortic root is normal in size and structure. There is moderate (Grade III) protruding plaque involving the descending aorta.  Venous: The inferior vena cava is normal in size with greater than 50% respiratory variability, suggesting right atrial pressure of 3 mmHg.  IAS/Shunts: Evidence of atrial level shunting detected by color flow Doppler.  Jerel Croitoru MD Electronically signed by Jerel Balding MD Signature Date/Time: 12/29/2021/9:22:23 AM    Final  MONITORS  LONG TERM MONITOR (3-14 DAYS) 09/20/2024  Narrative Patch Wear Time:  6 days and 9 hours (2025-10-09T08:44:44-0400 to 2025-10-15T17:49:37-0400)  Patient had a min  HR of 44 bpm, max HR of 154 bpm, and avg HR of 64 bpm. Predominant underlying rhythm was Sinus Rhythm. Bundle Branch Block/IVCD was present.  There were 20 symptomatic events all sinus  rhythm predominantly associated PVCs frequent PVCs and trigeminy.  4 brief APC runs occurred, the run with the fastest interval lasting 5 beats with a max rate of 154 bpm (avg 129 bpm); the run with the fastest interval was also the longest.  There were no episodes of atrial fibrillation or flutter.  There were no episodes of sinus pauses 3 seconds or greater or second or third-degree AV nodal block.  Isolated SVEs were occasional (2.0%, 11635), SVE Couplets were occasional (1.9%, 5539), and no SVE Triplets were present. Isolated  VEs were occasional (2.3%, 13552), VE Couplets were rare (<1.0%, 1), and no VE Triplets were present. Ventricular Bigeminy and Trigeminy were present.   CT SCANS  CT CORONARY MORPH W/CTA COR W/SCORE 09/29/2020  Addendum 09/29/2020 10:12 PM ADDENDUM REPORT: 09/29/2020 22:09  HISTORY: 68 yo male with ECG abnormal, 49yr CHD risk 10-20%, not treadmill candidate Chest pain/anginal equiv  EXAM: Cardiac/Coronary CTA  TECHNIQUE: The patient was scanned on a [employer].  PROTOCOL: A 120 kV prospective scan was triggered in the descending thoracic aorta at 111 HU's. Axial non-contrast 3 mm slices were carried out through the heart. The data set was analyzed on a dedicated work station and scored using the Agatson method. Gantry rotation speed was 250 msecs and collimation was .6 mm. Beta blockade and 0.8 mg of sl NTG was given. The 3D data set was reconstructed in 5% intervals of the 67-82 % of the R-R cycle. Diastolic phases were analyzed on a dedicated work station using MPR, MIP and VRT modes. The patient received 80mL OMNIPAQUE  IOHEXOL  350 MG/ML SOLN of contrast.  FINDINGS: Quality: Very good, HR 49  Coronary calcium  score: The patient's coronary artery calcium   score is 739, which places the patient in the 88th percentile.  Coronary arteries: Normal coronary origins.  Right dominance.  Right Coronary Artery: Dominant with diffuse calcification. Moderate to severe proximal mixed plaque 50-69% stenosis (CADRADS3). Mild mixed 24-49% mid-vessel stenosis (CADRADS2). Distal RCA appears 100% occluded (CADRADS5). There appears to be left to right collaterals from the LCx to R-PDA.  Left Main Coronary Artery: Minimal ostial non-calcified stenosis 1-24% (CADRADS1). Bifurcates into the LAD and LCx arteries.  Left Anterior Descending Coronary Artery: Anterior vessel which is calcified. Ostial moderate to severe mixed 50-69% stenosis (CADRADS3). Mid vessel moderate to severe mixed stenosis (50-69%), just prior to a small D1 (<2.0 mm branch) bifurcation. The distal LAD bifurcates in a pitchfork fashion without significant disease.  Left Circumflex Artery: AV groove vessel. Minimal 1-24% non-calcified ostial stenosis (CADRADS1). Mild 25-49% mixed plaque which appears positively remodeled (CADRADS2V). Moderate sized proximal OM1 and smaller OM2 without disease.  Aorta: Normal size, 31 mm at the mid ascending aorta (level of the PA bifurcation) measured double oblique. No calcifications. No dissection.  Aortic Valve: Trileaflet.  No calcifications.  Other findings:  Normal pulmonary vein drainage into the left atrium.  Normal left atrial appendage without a thrombus.  Normal size of the pulmonary artery.  IMPRESSION: 1. Moderate to severe multivessel CAD with probable distal RCA occlusion with left to right collaterals, CADRADS = 5. CT FFR will be performed and reported separately.  2. Coronary calcium  score of 739. This was 88th percentile for age and sex matched control.  3. Normal coronary origin with right dominance.  4. Definitive cardiac catheterization is recommended.   Electronically Signed By: Vinie JAYSON Maxcy M.D. On:  09/29/2020 22:09  Narrative EXAM: OVER-READ INTERPRETATION  CT CHEST  The following report is an over-read  performed by radiologist Dr. Toribio Aye of [facility] on 09/29/2020. This over-read does not include interpretation of cardiac or coronary anatomy or pathology. The coronary calcium  score/coronary CTA interpretation by the cardiologist is attached.  COMPARISON:  None.  FINDINGS: Within the visualized portions of the thorax there are no suspicious appearing pulmonary nodules or masses, there is no acute consolidative airspace disease, no pleural effusions, no pneumothorax and no lymphadenopathy. Visualized portions of the upper abdomen are unremarkable. There are no aggressive appearing lytic or blastic lesions noted in the visualized portions of the skeleton.  IMPRESSION: 1. No significant incidental noncardiac findings are noted.  Electronically Signed: By: Toribio Aye M.D. On: 09/29/2020 09:08     ______________________________________________________________________________________________          Recent Labs: 07/07/2024: ALT 87 07/10/2024: Hemoglobin 12.7; Platelets 163 09/21/2024: BUN 18; Creatinine, Ser 0.81; Magnesium  1.8; Potassium 4.4; Sodium 138  Recent Lipid Panel    Component Value Date/Time   CHOL 150 07/08/2024 0302   CHOL 136 06/17/2021 0850   TRIG 71 07/08/2024 0302   HDL 49 07/08/2024 0302   HDL 50 06/17/2021 0850   CHOLHDL 3.1 07/08/2024 0302   VLDL 14 07/08/2024 0302   LDLCALC 87 07/08/2024 0302   LDLCALC 67 06/17/2021 0850    Physical Exam:    VS:  There were no vitals taken for this visit.    Wt Readings from Last 3 Encounters:  09/06/24 175 lb (79.4 kg)  07/18/24 175 lb (79.4 kg)  07/09/24 174 lb 2.6 oz (79 kg)     GEN: *** Well nourished, well developed in no acute distress HEENT: Normal NECK: No JVD; No carotid bruits LYMPHATICS: No lymphadenopathy CARDIAC: ***RRR, no murmurs, rubs,  gallops RESPIRATORY:  Clear to auscultation without rales, wheezing or rhonchi  ABDOMEN: Soft, non-tender, non-distended MUSCULOSKELETAL:  No edema; No deformity  SKIN: Warm and dry NEUROLOGIC:  Alert and oriented x 3 PSYCHIATRIC:  Normal affect    Signed, Redell Leiter, MD  10/18/2024 8:58 PM    [facility]

## 2024-10-22 ENCOUNTER — Ambulatory Visit: Admitting: Cardiology

## 2024-10-22 ENCOUNTER — Other Ambulatory Visit (HOSPITAL_BASED_OUTPATIENT_CLINIC_OR_DEPARTMENT_OTHER): Payer: Self-pay | Admitting: Neurosurgery

## 2024-10-22 DIAGNOSIS — M4722 Other spondylosis with radiculopathy, cervical region: Secondary | ICD-10-CM

## 2024-11-01 ENCOUNTER — Ambulatory Visit (INDEPENDENT_AMBULATORY_CARE_PROVIDER_SITE_OTHER)
Admission: RE | Admit: 2024-11-01 | Discharge: 2024-11-01 | Disposition: A | Source: Ambulatory Visit | Attending: Neurosurgery | Admitting: Neurosurgery

## 2024-11-01 DIAGNOSIS — M4722 Other spondylosis with radiculopathy, cervical region: Secondary | ICD-10-CM

## 2024-11-14 ENCOUNTER — Telehealth: Payer: Self-pay

## 2024-11-14 NOTE — Telephone Encounter (Signed)
 Preop clearance reviewed. Patient is not only on aspirin  but also Effient  given PCI for unstable angina 06/2024 - he was recommended for dual antiplatelet therapy with aspirin  and prasugrel  for up to 12 months, to consider switch to clopidogrel  thereafter if CYP2C19 genotype is favorable. Patient is not currently a candidate for cervical fusion surgery as he cannot hold antiplatelets yet, but will route to Dr. Monetta to find out the earliest timeframe he would be comfortable with patient interrupting anticoagulation, then will plan to schedule follow-up in the office around that time for formal pre-operative reassessment. Dr. Monetta - Please route response to P CV DIV PREOP (the pre-op pool). Thank you.

## 2024-11-14 NOTE — Telephone Encounter (Signed)
° °  Pre-operative Risk Assessment    Patient Name: Derek Moore  DOB: [birth year] MRN: [id number]   Date of last office visit: 09/06/24 Date of next office visit: N/A   Request for Surgical Clearance    Procedure:  Cervical Fusion  Date of Surgery:  Clearance TBD                                Surgeon:  Dr. Mavis Socks Group or Practice Name:  [facility] Phone number:  [phone] Fax number:  [phone]   Type of Clearance Requested:   - Pharmacy:  Hold Aspirin  please advise   Type of Anesthesia:  General    Additional requests/questions:    SignedCalvert Pouch   11/14/2024, 2:27 PM

## 2024-11-15 NOTE — Telephone Encounter (Signed)
° °  Name: Derek Moore  DOB: 1956/08/19  MRN: [id number]  Primary Cardiologist: Redell Leiter, MD   Preoperative team, please contact this patient and set up a phone call appointment for further preoperative risk assessment. Please obtain consent and complete medication review. Thank you for your help.  I confirm that guidance regarding antiplatelet and oral anticoagulation therapy has been completed and, if necessary, noted below.  Patient will not be able to hold DAPT until after 01/09/2025 per Dr. Leiter.   I also confirmed the patient resides in the state of [place] . As per [facility] Medical Board telemedicine laws, the patient must reside in the state in which the provider is licensed.    Barnie Hila, NP 11/15/2024, 10:15 AM [facility]

## 2024-11-15 NOTE — Telephone Encounter (Signed)
 Left the patient a detailed message to call pre-op schedule clearance. 1st attempt.

## 2024-11-15 NOTE — Telephone Encounter (Signed)
 Called and patient has been scheduled for pre-op clearance on 11/16/24 with Barnie Hila, NP. Med list and consent done.    Patient Consent for Virtual Visit        Derek Moore has provided verbal consent on 11/15/2024 for a virtual visit (video or telephone).   CONSENT FOR VIRTUAL VISIT FOR:  Derek Moore  By participating in this virtual visit I agree to the following:  I hereby voluntarily request, consent and authorize [facility] and its employed or contracted physicians, physician assistants, nurse practitioners or other licensed health care professionals (the Practitioner), to provide me with telemedicine health care services (the Services) as deemed necessary by the treating Practitioner. I acknowledge and consent to receive the Services by the Practitioner via telemedicine. I understand that the telemedicine visit will involve communicating with the Practitioner through live audiovisual communication technology and the disclosure of certain medical information by electronic transmission. I acknowledge that I have been given the opportunity to request an in-person assessment or other available alternative prior to the telemedicine visit and am voluntarily participating in the telemedicine visit.  I understand that I have the right to withhold or withdraw my consent to the use of telemedicine in the course of my care at any time, without affecting my right to future care or treatment, and that the Practitioner or I may terminate the telemedicine visit at any time. I understand that I have the right to inspect all information obtained and/or recorded in the course of the telemedicine visit and may receive copies of available information for a reasonable fee.  I understand that some of the potential risks of receiving the Services via telemedicine include:  Delay or interruption in medical evaluation due to technological equipment failure or disruption; Information  transmitted may not be sufficient (e.g. poor resolution of images) to allow for appropriate medical decision making by the Practitioner; and/or  In rare instances, security protocols could fail, causing a breach of personal health information.  Furthermore, I acknowledge that it is my responsibility to provide information about my medical history, conditions and care that is complete and accurate to the best of my ability. I acknowledge that Practitioner's advice, recommendations, and/or decision may be based on factors not within their control, such as incomplete or inaccurate data provided by me or distortions of diagnostic images or specimens that may result from electronic transmissions. I understand that the practice of medicine is not an exact science and that Practitioner makes no warranties or guarantees regarding treatment outcomes. I acknowledge that a copy of this consent can be made available to me via my patient portal [facility] MyChart), or I can request a printed copy by calling the office of [facility].    I understand that my insurance will be billed for this visit.   I have read or had this consent read to me. I understand the contents of this consent, which adequately explains the benefits and risks of the Services being provided via telemedicine.  I have been provided ample opportunity to ask questions regarding this consent and the Services and have had my questions answered to my satisfaction. I give my informed consent for the services to be provided through the use of telemedicine in my medical care

## 2024-11-15 NOTE — Telephone Encounter (Signed)
 Patient was returning call. Please advise ?

## 2024-11-16 ENCOUNTER — Ambulatory Visit: Attending: Internal Medicine | Admitting: Student

## 2024-11-16 DIAGNOSIS — Z01810 Encounter for preprocedural cardiovascular examination: Secondary | ICD-10-CM | POA: Diagnosis not present

## 2024-11-16 NOTE — Progress Notes (Signed)
 "   Virtual Visit via Telephone Note   Because of Derek Moore's co-morbid illnesses, he is at least at moderate risk for complications without adequate follow up.  This format is felt to be most appropriate for this patient at this time.  The patient did not have access to video technology/had technical difficulties with video requiring transitioning to audio format only (telephone).  All issues noted in this document were discussed and addressed.  No physical exam could be performed with this format.  Please refer to the patient's chart for his consent to telehealth for [facility].  Evaluation Performed:  Preoperative cardiovascular risk assessment _____________   Date:  11/16/2024   Patient ID:  Derek Moore, DOB [birth year], MRN [id number] Patient Location:  Home Provider location:   Office  Primary Care Provider:  Thurmond Cathlyn LABOR., MD  Primary Cardiologist:  [facility] Providers Cardiologist:  Redell Leiter, MD Electrophysiologist:  OLE ONEIDA HOLTS, MD    Chief Complaint / Patient Profile   68 y.o. y/o male with a h/o CAD s/p CABG x 02 October 2020 and PCI with DES to LCx and angioplasty to OM1 August 2025, PAF s/p Watchman implantation December 2022, hyperlipidemia, asthma, GERD, hypothyroid, T2DM who is pending cervical fusion by Dr. Mavis and presents today for telephonic preoperative cardiovascular risk assessment.  History of Present Illness    [name] is a 68 y.o. male who presents via audio/video conferencing for a telehealth visit today.  Pt was last seen in cardiology clinic on 09/06/2024 by Dr. Leiter.  At that time Derek Moore was stable from a cardiac standpoint.  The patient is now pending procedure as outlined above. Since his last visit, he is doing well. Patient denies shortness of breath, dyspnea on exertion, lower extremity edema, orthopnea or PND. No chest pain, pressure, or tightness. He reports his heart always  skips. He is not experiencing lightheadedness, dizziness, presyncope or syncope. He is very active around his home he states I get out around 6:30 in the morning and go back in around 5 pm. He chops wood, works in his [employer], and maintains his property without limitation.   Past Medical History    Past Medical History:  Diagnosis Date   Abnormal cardiac CT angiography    Abnormal EKG 03/09/2018   Acquired hypothyroidism 03/07/2018   Acute coronary syndrome (HCC) 07/06/2024   Allergy    ANA positive 10/02/2020   Formatting of this note might be different from the original.  Homogenous. Other tests negartive. Probably false.  See note 10/02/2020   Aortic atherosclerosis 07/17/2020   Formatting of this note might be different from the original. Seen on [facility] 06/2019   Arthritis    Asthma    CAD (coronary artery disease), native coronary artery    Chronic gout without tophus 03/07/2018   Coronary artery disease involving native coronary artery of native heart without angina pectoris 07/17/2020   Formatting of this note might be different from the original.  Seen on CT [facility] 06/2019     Formatting of this note might be different from the original.  Status post four-vessel bypass     Formatting of this note might be different from the original.  Status post four-vessel bypass 09/2020   Coronary artery disease involving native coronary artery with unstable angina pectoris (HCC)    Coronary artery disease of native artery of native heart with stable angina pectoris    Cough syncope 1999  Dental caries    Depression 03/07/2018   Diplopia 10/11/2018   DISH (diffuse idiopathic skeletal hyperostosis) 07/17/2020   Formatting of this note might be different from the original. Seen on CT [facility] 06/2019   Dizzy 05/09/2018   Gastroesophageal reflux disease without esophagitis 03/07/2018   Formatting of this note might be different from the original. Long term   GERD (gastroesophageal reflux disease)  03/07/2018   Globus sensation 09/22/2022   Gout 03/07/2018   Hay fever    Heel spur, left 02/07/2020   History of asthma 11/06/2020   History of iron deficiency anemia 02/13/2022   History of smokeless tobacco use 07/17/2020   Formatting of this note might be different from the original.  Discussed chantix.  He wants to use. Did well in past. Off label, he understands.     Last Assessment & Plan:   Formatting of this note might be different from the original.  Patient has been without smokeless tobacco for 1 week now   Hyperlipidemia 03/07/2018   Hyperlipidemia associated with type 2 diabetes mellitus (HCC) 03/07/2018   Hypomagnesemia 01/29/2021   Hypothyroidism (acquired) 03/07/2018   Formatting of this note might be different from the original. 1990s.   Mild cognitive impairment 07/01/2022   Mild episode of recurrent major depressive disorder 07/17/2020   Myalgia due to statin 08/05/2021   Myasthenia gravis (HCC) 07/06/2024   Obesity    Obesity (BMI 30-39.9) 12/05/2020   Ocular myasthenia gravis (HCC) 07/17/2020   Formatting of this note might be different from the original. Ocular.  Follows at [facility].  Sees neurology and eye doctor.   Orthostatic hypotension 12/19/2020   PAF (paroxysmal atrial fibrillation) (HCC) 11/12/2021   Palpitation 03/08/2018   Plantar fasciitis 02/07/2020   Presence of Watchman left atrial appendage closure device 11/12/2021   with 24mm Watchman FLX woth Dr. Cindie   Primary osteoarthritis involving multiple joints 07/17/2020   Recurrent sinusitis    S/P CABG x 4 10/27/2020   Smokeless tobacco use 07/17/2020   Formatting of this note might be different from the original. Discussed chantix.  He wants to use. Did well in past. Off label, he understands.   Subcutaneous nodules 01/27/2021   Thrombus of left atrial appendage    Tightness of heel cord, left 02/07/2020   Type 2 diabetes mellitus (HCC) 03/07/2018   Type 2 diabetes mellitus with hyperlipidemia  (HCC) 03/07/2018   Type 2 diabetes mellitus without complication, without long-term current use of insulin  (HCC) 03/07/2018   Formatting of this note might be different from the original. 2014.   Vitamin B12 deficiency 09/25/2020   Past Surgical History:  Procedure Laterality Date   CATARACT EXTRACTION W/ INTRAOCULAR LENS  IMPLANT, BILATERAL     COLONOSCOPY W/ POLYPECTOMY  11/27/2012   Colonic polyp, status post polypectomy. Mild sigmoid diverticulosis.   CORONARY ARTERY BYPASS GRAFT N/A 10/27/2020   Procedure: CORONARY ARTERY BYPASS GRAFTING (CABG), ON PUMP, TIMES FOUR, USING LEFT INTERNAL MAMMARY ARTERY AND ENDOSCOPICALLY HARVESTED RIGHT GREATER SAPHENOUS VEIN;  Surgeon: Lucas Dorise POUR, MD;  Location: [facility] OR;  Service: Open Heart Surgery;  Laterality: N/A;   CORONARY PRESSURE/FFR STUDY N/A 07/09/2024   Procedure: CORONARY PRESSURE/FFR STUDY;  Surgeon: Mady Bruckner, MD;  Location: [facility] INVASIVE CV LAB;  Service: Cardiovascular;  Laterality: N/A;   CORONARY STENT INTERVENTION N/A 07/09/2024   Procedure: CORONARY STENT INTERVENTION;  Surgeon: Mady Bruckner, MD;  Location: [facility] INVASIVE CV LAB;  Service: Cardiovascular;  Laterality: N/A;   DENTAL SURGERY  implants x 2   detatched retina     ESOPHAGOGASTRODUODENOSCOPY  04/17/2007   Small hiatal hernia. Mild gastritis. Otherwise normal EGD   kidney stone extraction     cystoscopy x 2   KNEE SURGERY Left    bone spurs   LEFT ATRIAL APPENDAGE OCCLUSION N/A 11/12/2021   Procedure: LEFT ATRIAL APPENDAGE OCCLUSION;  Surgeon: Cindie Ole DASEN, MD;  Location: [facility] INVASIVE CV LAB;  Service: Cardiovascular;  Laterality: N/A;   LEFT HEART CATH AND CORONARY ANGIOGRAPHY N/A 10/08/2020   Procedure: LEFT HEART CATH AND CORONARY ANGIOGRAPHY;  Surgeon: Anner Alm ORN, MD;  Location: [facility] INVASIVE CV LAB;  Service: Cardiovascular;  Laterality: N/A;   LEFT HEART CATH AND CORS/GRAFTS ANGIOGRAPHY N/A 07/09/2024   Procedure: LEFT HEART CATH AND CORS/GRAFTS  ANGIOGRAPHY;  Surgeon: Mady Bruckner, MD;  Location: [facility] INVASIVE CV LAB;  Service: Cardiovascular;  Laterality: N/A;   NOSE SURGERY     TEE WITHOUT CARDIOVERSION N/A 10/27/2020   Procedure: TRANSESOPHAGEAL ECHOCARDIOGRAM (TEE);  Surgeon: Lucas Dorise POUR, MD;  Location: [facility] OR;  Service: Open Heart Surgery;  Laterality: N/A;   TEE WITHOUT CARDIOVERSION N/A 01/08/2021   Procedure: TRANSESOPHAGEAL ECHOCARDIOGRAM (TEE);  Surgeon: Maranda Leim DEL, MD;  Location: [facility] ENDOSCOPY;  Service: Cardiovascular;  Laterality: N/A;   TEE WITHOUT CARDIOVERSION N/A 11/12/2021   Procedure: TRANSESOPHAGEAL ECHOCARDIOGRAM (TEE);  Surgeon: Cindie Ole DASEN, MD;  Location: [facility] INVASIVE CV LAB;  Service: Cardiovascular;  Laterality: N/A;   TEE WITHOUT CARDIOVERSION N/A 12/29/2021   Procedure: TRANSESOPHAGEAL ECHOCARDIOGRAM (TEE);  Surgeon: Francyne Headland, MD;  Location: [facility] ENDOSCOPY;  Service: Cardiovascular;  Laterality: N/A;    Allergies  Allergies[1]  Home Medications    Prior to Admission medications  Medication Sig Start Date End Date Taking? Authorizing Provider  acetaminophen  (TYLENOL ) 500 MG tablet Take 500-1,000 mg by mouth every 6 (six) hours as needed (for pain.).    [provider]  albuterol  (VENTOLIN  HFA) 108 (90 Base) MCG/ACT inhaler Inhale into the lungs every 6 (six) hours as needed for wheezing or shortness of breath.    [provider]  aspirin  EC 81 MG tablet Take 1 tablet (81 mg total) by mouth at bedtime. 07/10/24   Arrien, Mauricio Daniel, MD  Dextran 70-Hypromellose, PF, (ARTIFICIAL TEARS PF) 0.1-0.3 % SOLN Place 1 drop into both eyes 3 (three) times daily as needed (dry/irritated eyes.).    [provider]  Evolocumab  (REPATHA  SURECLICK) 140 MG/ML SOAJ Inject 140 mg into the skin every 14 (fourteen) days. 10/12/24   Monetta Redell PARAS, MD  fludrocortisone  (FLORINEF ) 0.1 MG tablet TAKE 1 TABLET BY MOUTH DAILY. 10/09/24   Monetta Redell PARAS, MD  levothyroxine   (SYNTHROID ) 137 MCG tablet Take 137 mcg by mouth daily. 06/17/23   [provider]  Magnesium  Oxide 250 MG TABS Take 250 mg by mouth daily. 01/29/21   [provider]  metFORMIN (GLUCOPHAGE) 1000 MG tablet Take 1,000 mg by mouth 2 (two) times daily with a meal.    [provider]  midodrine  (PROAMATINE ) 5 MG tablet TAKE 1 TABLET BY MOUTH THREE TIMES DAILY WITH MEALS. Patient not taking: Reported on 11/15/2024 08/22/24   Monetta Redell PARAS, MD  mycophenolate  (CELLCEPT ) 500 MG tablet Take 1,000 mg by mouth 2 (two) times daily.    [provider]  Omega-3 Fatty Acids (FISH OIL ULTRA) 1400 MG CAPS Take 1,400 mg by mouth in the morning and at bedtime.    [provider]  pantoprazole  (PROTONIX ) 40 MG tablet  Take 40 mg by mouth at bedtime.    [provider]  PARoxetine  (PAXIL ) 40 MG tablet Take 40 mg by mouth daily. 12/25/20   [provider]  prasugrel  (EFFIENT ) 10 MG TABS tablet Take 1 tablet (10 mg total) by mouth daily. 07/31/24   Carlin Delon BROCKS, NP  pyridostigmine  (MESTINON ) 60 MG tablet Take 90 mg by mouth 4 (four) times daily. Take 1 1/2 tablet (90 mg) 4 times daily 11/15/19   [provider]  vitamin B-12 (CYANOCOBALAMIN) 1000 MCG tablet Take 1,000 mcg by mouth daily.    [provider]    Physical Exam    Vital Signs:  KASS HERBERGER does not have vital signs available for review today.  Given telephonic nature of communication, physical exam is limited. AAOx3. NAD. Normal affect.  Speech and respirations are unlabored.   Assessment & Plan    Preoperative cardiovascular risk assessment.  Cervical fusion by Dr. Mavis.  Chart reviewed as part of pre-operative protocol coverage. According to the RCRI, patient has a 6.6% risk of MACE. Patient reports activity equivalent to >4.0 METS (light to heavy yard work and household activities).   Given past medical history and time since last visit, based on ACC/AHA  guidelines, DALONTE HARDAGE would be at acceptable risk for the planned procedure without further cardiovascular testing.   Patient was advised that if he develops new symptoms prior to surgery to contact our office to arrange a follow-up appointment.  he verbalized understanding.  Per Dr. Monetta, patient will not be able to hold dual antiplatelet therapy until after 01/09/2025.  After that time, per office protocol, he may hold Effient  for 5 days prior to procedure and should resume as soon as hemodynamically stable postoperatively. Ideally aspirin  should be continued without interruption, however if the bleeding risk is too great, aspirin  may be held for 5-7 days prior to surgery. Please resume aspirin  post operatively when it is felt to be safe from a bleeding standpoint.    I will route this recommendation to the requesting party via Epic fax function.  Please call with questions.  Time:   Today, I have spent 5 minutes with the patient with telehealth technology discussing medical history, symptoms, and management plan.     Barnie Hila, NP  11/16/2024, 8:07 AM     [1]  Allergies Allergen Reactions   Codeine Itching   Hydrocodone-Acetaminophen  Hives and Itching   Lipitor [Atorvastatin] Other (See Comments)    Myalgia--all statins per pt   Crestor  [Rosuvastatin ] Other (See Comments)    myalgias   Zetia  [Ezetimibe ] Diarrhea   Amoxicillin  Rash   Latex Itching   "

## 2024-12-19 ENCOUNTER — Other Ambulatory Visit: Payer: Self-pay | Admitting: Neurosurgery

## 2025-01-17 ENCOUNTER — Ambulatory Visit (HOSPITAL_COMMUNITY): Admission: RE | Admit: 2025-01-17 | Admitting: Neurosurgery

## 2025-01-17 ENCOUNTER — Encounter (HOSPITAL_COMMUNITY): Admission: RE | Payer: Self-pay | Source: Home / Self Care
